# Patient Record
Sex: Male | Born: 1950 | Race: White | Hispanic: No | Marital: Married | Smoking: Never smoker
Health system: Southern US, Community
[De-identification: ages and names within clinical notes are randomized; demographics above are authoritative.]

## PROBLEM LIST (undated history)

## (undated) DIAGNOSIS — T7840XA Allergy, unspecified, initial encounter: Secondary | ICD-10-CM

## (undated) DIAGNOSIS — R7303 Prediabetes: Secondary | ICD-10-CM

## (undated) DIAGNOSIS — E119 Type 2 diabetes mellitus without complications: Secondary | ICD-10-CM

## (undated) DIAGNOSIS — N059 Unspecified nephritic syndrome with unspecified morphologic changes: Secondary | ICD-10-CM

## (undated) DIAGNOSIS — I2510 Atherosclerotic heart disease of native coronary artery without angina pectoris: Secondary | ICD-10-CM

## (undated) DIAGNOSIS — IMO0002 Reserved for concepts with insufficient information to code with codable children: Secondary | ICD-10-CM

## (undated) DIAGNOSIS — I10 Essential (primary) hypertension: Secondary | ICD-10-CM

## (undated) DIAGNOSIS — G4730 Sleep apnea, unspecified: Secondary | ICD-10-CM

## (undated) DIAGNOSIS — E785 Hyperlipidemia, unspecified: Secondary | ICD-10-CM

## (undated) HISTORY — PX: SPINE SURGERY: SHX786

## (undated) HISTORY — DX: Unspecified nephritic syndrome with unspecified morphologic changes: N05.9

## (undated) HISTORY — DX: Hyperlipidemia, unspecified: E78.5

## (undated) HISTORY — PX: ULNAR NERVE REPAIR: SHX2594

## (undated) HISTORY — DX: Prediabetes: R73.03

## (undated) HISTORY — DX: Allergy, unspecified, initial encounter: T78.40XA

## (undated) HISTORY — DX: Atherosclerotic heart disease of native coronary artery without angina pectoris: I25.10

## (undated) HISTORY — DX: Reserved for concepts with insufficient information to code with codable children: IMO0002

## (undated) HISTORY — DX: Essential (primary) hypertension: I10

## (undated) HISTORY — PX: US ECHOCARDIOGRAPHY: HXRAD669

## (undated) HISTORY — DX: Sleep apnea, unspecified: G47.30

## (undated) HISTORY — DX: Type 2 diabetes mellitus without complications: E11.9

## (undated) HISTORY — PX: CERVICAL DISC SURGERY: SHX588

---

## 2003-07-19 DIAGNOSIS — I219 Acute myocardial infarction, unspecified: Secondary | ICD-10-CM

## 2003-07-19 DIAGNOSIS — I2510 Atherosclerotic heart disease of native coronary artery without angina pectoris: Secondary | ICD-10-CM

## 2003-07-19 HISTORY — DX: Atherosclerotic heart disease of native coronary artery without angina pectoris: I25.10

## 2003-07-19 HISTORY — DX: Acute myocardial infarction, unspecified: I21.9

## 2003-07-19 HISTORY — PX: CORONARY STENT PLACEMENT: SHX1402

## 2003-07-21 ENCOUNTER — Inpatient Hospital Stay (HOSPITAL_COMMUNITY): Admission: AD | Admit: 2003-07-21 | Discharge: 2003-07-25 | Payer: Self-pay | Admitting: *Deleted

## 2003-07-22 ENCOUNTER — Encounter: Payer: Self-pay | Admitting: Cardiovascular Disease

## 2004-09-29 ENCOUNTER — Ambulatory Visit: Payer: Self-pay | Admitting: Internal Medicine

## 2004-12-03 ENCOUNTER — Ambulatory Visit: Payer: Self-pay | Admitting: Internal Medicine

## 2005-02-16 ENCOUNTER — Ambulatory Visit: Payer: Self-pay | Admitting: Family Medicine

## 2005-04-21 ENCOUNTER — Ambulatory Visit: Payer: Self-pay | Admitting: Internal Medicine

## 2005-05-10 ENCOUNTER — Ambulatory Visit: Payer: Self-pay | Admitting: Internal Medicine

## 2005-06-23 ENCOUNTER — Ambulatory Visit: Payer: Self-pay | Admitting: Internal Medicine

## 2005-07-22 ENCOUNTER — Ambulatory Visit (HOSPITAL_COMMUNITY): Admission: RE | Admit: 2005-07-22 | Discharge: 2005-07-22 | Payer: Self-pay | Admitting: Neurosurgery

## 2005-08-09 ENCOUNTER — Ambulatory Visit: Payer: Self-pay | Admitting: Cardiology

## 2005-11-08 ENCOUNTER — Ambulatory Visit: Payer: Self-pay | Admitting: Internal Medicine

## 2007-03-27 DIAGNOSIS — J309 Allergic rhinitis, unspecified: Secondary | ICD-10-CM | POA: Insufficient documentation

## 2007-03-27 DIAGNOSIS — I2510 Atherosclerotic heart disease of native coronary artery without angina pectoris: Secondary | ICD-10-CM | POA: Insufficient documentation

## 2007-03-28 DIAGNOSIS — E785 Hyperlipidemia, unspecified: Secondary | ICD-10-CM | POA: Insufficient documentation

## 2007-04-05 ENCOUNTER — Ambulatory Visit: Payer: Self-pay | Admitting: Internal Medicine

## 2007-04-05 LAB — CONVERTED CEMR LAB
ALT: 40 units/L (ref 0–53)
Albumin: 4 g/dL (ref 3.5–5.2)
BUN: 19 mg/dL (ref 6–23)
Basophils Relative: 0.4 % (ref 0.0–1.0)
CO2: 30 meq/L (ref 19–32)
Chloride: 110 meq/L (ref 96–112)
Creatinine, Ser: 1.1 mg/dL (ref 0.4–1.5)
Eosinophils Relative: 3.4 % (ref 0.0–5.0)
GFR calc non Af Amer: 74 mL/min
HDL: 21.5 mg/dL — ABNORMAL LOW (ref >39.0)
Hemoglobin: 16.5 g/dL (ref 13.0–17.0)
LDL Cholesterol: 84 mg/dL (ref 0–99)
Lymphocytes Relative: 27.1 % (ref 12.0–46.0)
Monocytes Absolute: 0.4 10*3/uL (ref 0.2–0.7)
Monocytes Relative: 7.2 % (ref 3.0–11.0)
RDW: 12.5 % (ref 11.5–14.6)
Sodium: 145 meq/L (ref 135–145)
TSH: 0.9 microintl units/mL (ref 0.35–5.50)
VLDL: 17 mg/dL (ref 0–40)
WBC: 5.2 10*3/uL (ref 4.5–10.5)

## 2007-04-25 IMAGING — CR DG CHEST 2V
2 series · 2 of 2 positions shown · non-contrast
Comparison: none

CLINICAL DATA: Preop for ulnar nerve decompression.  
 CHEST - 2 VIEW: 
 Two views of the chest show the lungs to be clear.  The heart is within upper limits of normal.  No bony abnormality.

[view not recorded (1 of 2)]
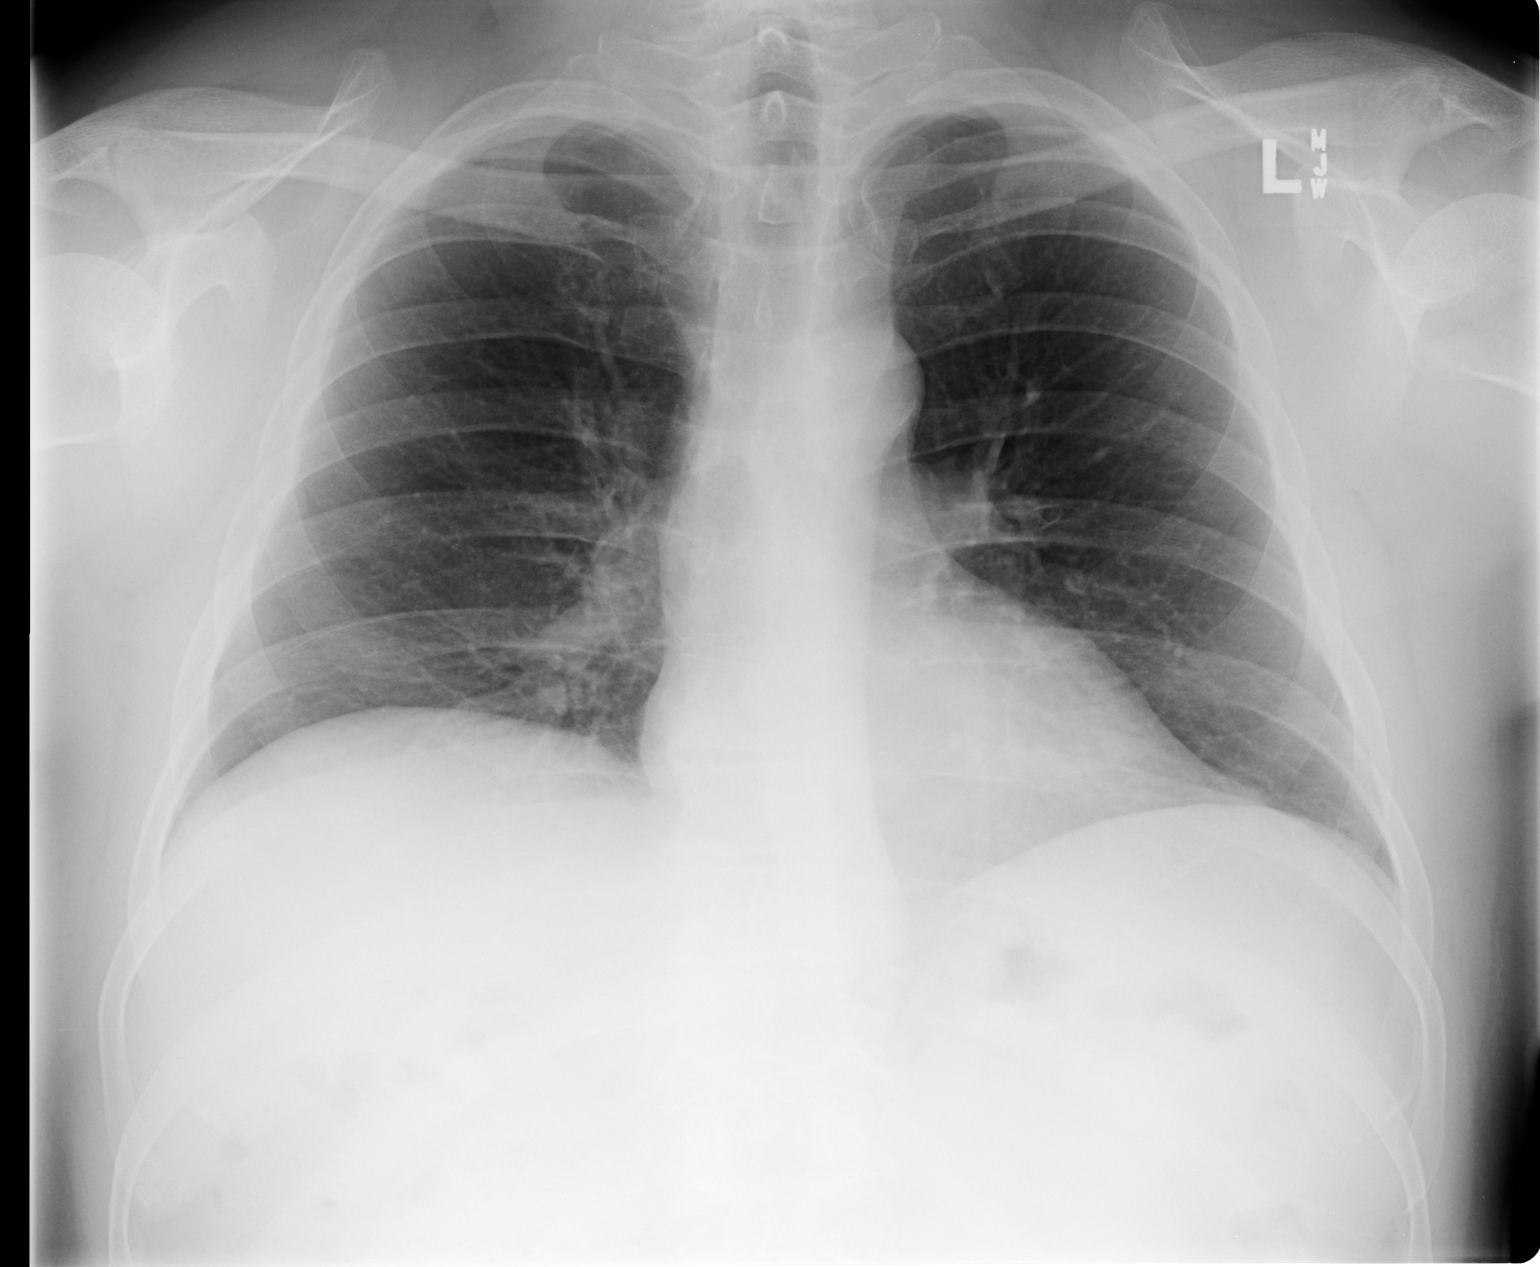

[view not recorded (2 of 2)]
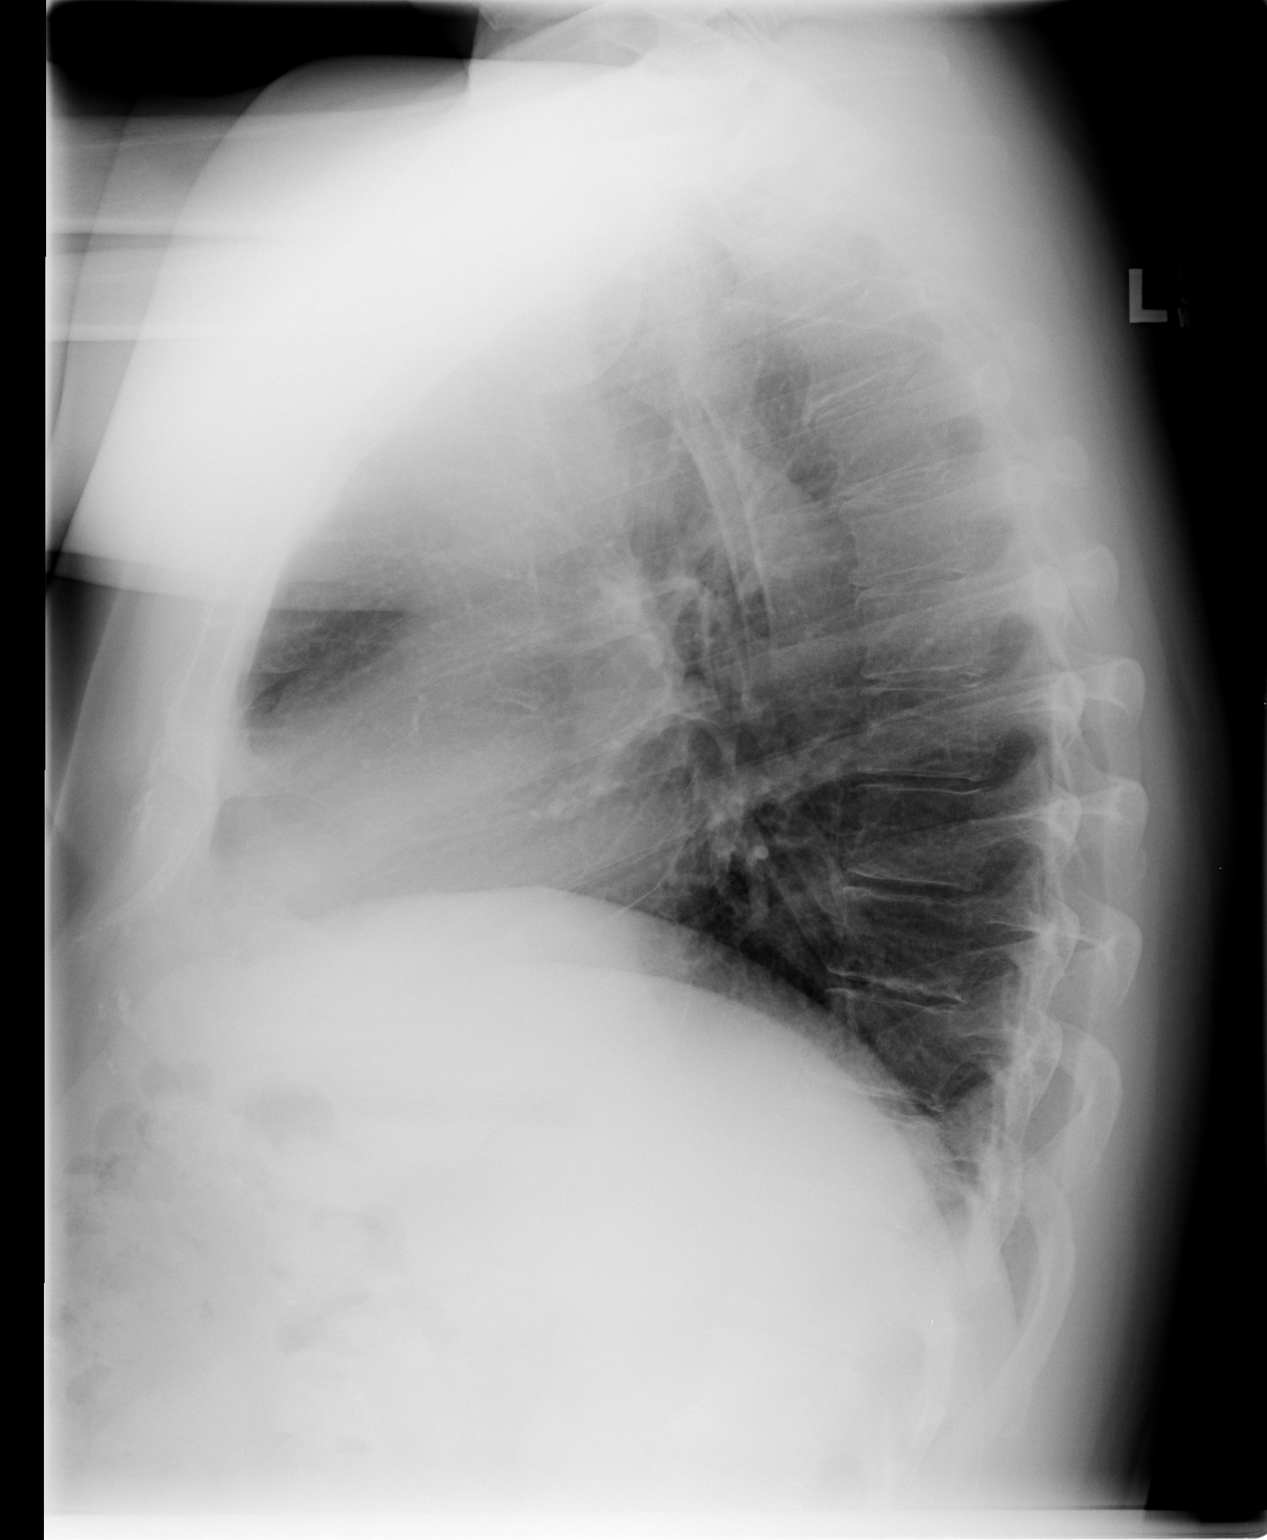

[2 of 2 positions shown; findings below may reference images not displayed]

IMPRESSION: No active lung disease.

## 2007-09-07 ENCOUNTER — Telehealth: Payer: Self-pay | Admitting: Internal Medicine

## 2007-11-13 ENCOUNTER — Telehealth (INDEPENDENT_AMBULATORY_CARE_PROVIDER_SITE_OTHER): Payer: Self-pay | Admitting: *Deleted

## 2008-12-09 ENCOUNTER — Telehealth: Payer: Self-pay | Admitting: Internal Medicine

## 2008-12-23 ENCOUNTER — Ambulatory Visit: Payer: Self-pay | Admitting: Internal Medicine

## 2008-12-23 DIAGNOSIS — IMO0002 Reserved for concepts with insufficient information to code with codable children: Secondary | ICD-10-CM | POA: Insufficient documentation

## 2008-12-24 LAB — CONVERTED CEMR LAB
ALT: 32 units/L (ref 0–53)
Alkaline Phosphatase: 46 units/L (ref 39–117)
Basophils Absolute: 0 10*3/uL (ref 0.0–0.1)
Bilirubin, Direct: 0.1 mg/dL (ref 0.0–0.3)
Chloride: 113 meq/L — ABNORMAL HIGH (ref 96–112)
Cholesterol: 135 mg/dL (ref 0–200)
Creatinine, Ser: 1.1 mg/dL (ref 0.4–1.5)
Eosinophils Absolute: 0.2 10*3/uL (ref 0.0–0.7)
Lymphocytes Relative: 31 % (ref 12.0–46.0)
MCHC: 35.3 g/dL (ref 30.0–36.0)
Neutro Abs: 3.1 10*3/uL (ref 1.4–7.7)
Neutrophils Relative %: 58.5 % (ref 43.0–77.0)
PSA: 1.24 ng/mL (ref 0.10–4.00)
Platelets: 138 10*3/uL — ABNORMAL LOW (ref 150.0–400.0)
Potassium: 4.6 meq/L (ref 3.5–5.1)
RDW: 12.3 % (ref 11.5–14.6)
Sodium: 144 meq/L (ref 135–145)
TSH: 1.1 microintl units/mL (ref 0.35–5.50)
Total Protein: 6.6 g/dL (ref 6.0–8.3)
Triglycerides: 101 mg/dL (ref 0.0–149.0)

## 2008-12-29 ENCOUNTER — Telehealth: Payer: Self-pay | Admitting: Internal Medicine

## 2009-01-05 ENCOUNTER — Telehealth: Payer: Self-pay | Admitting: Internal Medicine

## 2009-01-15 ENCOUNTER — Telehealth: Payer: Self-pay | Admitting: Family Medicine

## 2009-08-20 ENCOUNTER — Ambulatory Visit: Payer: Self-pay | Admitting: Internal Medicine

## 2009-08-24 LAB — CONVERTED CEMR LAB
Alkaline Phosphatase: 48 units/L (ref 39–117)
BUN: 13 mg/dL (ref 6–23)
Basophils Absolute: 0 10*3/uL (ref 0.0–0.1)
Bilirubin, Direct: 0.2 mg/dL (ref 0.0–0.3)
CO2: 31 meq/L (ref 19–32)
Chloride: 106 meq/L (ref 96–112)
Cholesterol: 140 mg/dL (ref 0–200)
Creatinine, Ser: 1.1 mg/dL (ref 0.4–1.5)
Eosinophils Absolute: 0.2 10*3/uL (ref 0.0–0.7)
GFR calc non Af Amer: 73.47 mL/min (ref >60)
HCT: 47.9 % (ref 39.0–52.0)
LDL Cholesterol: 80 mg/dL (ref 0–99)
Lymphs Abs: 1.6 10*3/uL (ref 0.7–4.0)
MCHC: 34.5 g/dL (ref 30.0–36.0)
MCV: 99.4 fL (ref 78.0–100.0)
Monocytes Absolute: 0.3 10*3/uL (ref 0.1–1.0)
Platelets: 137 10*3/uL — ABNORMAL LOW (ref 150.0–400.0)
RDW: 12.3 % (ref 11.5–14.6)
TSH: 1.2 microintl units/mL (ref 0.35–5.50)
Total Protein: 7 g/dL (ref 6.0–8.3)

## 2009-09-08 ENCOUNTER — Ambulatory Visit: Payer: Self-pay | Admitting: Internal Medicine

## 2009-09-09 ENCOUNTER — Telehealth: Payer: Self-pay | Admitting: Internal Medicine

## 2009-09-09 ENCOUNTER — Encounter: Payer: Self-pay | Admitting: Internal Medicine

## 2010-08-17 NOTE — Progress Notes (Signed)
Summary: needs written scripts for mail order  Phone Note Refill Request Call back at [phone] Message from:  Patient  Refills Requested: Medication #1:  SIMVASTATIN 80 MG TABS Take one by mouth once a day  Medication #2:  METOPROLOL TARTRATE 25 MG  TABS 1/2 tablet two times a day.  Medication #3:  BENAZEPRIL HCL 10 MG TABS Take one by mouth once a day Pt is asking for 90 day scripts to send to [facility]- he is brand new with them and has not yet sent in his paper work, he will send the scripts with the paperwork.  Please call when ready.  Initial call taken by: Lowella Petties CMA,  September 09, 2009 11:18 AM  Follow-up for Phone Call        rx's in your inbox DeShannon Katrinka Blazing CMA Duncan Dull)  September 09, 2009 12:27 PM   signed Follow-up by: Cindee Salt MD,  September 09, 2009 12:54 PM  Additional Follow-up for Phone Call Additional follow up Details #1::        Spoke with patient and advised rx ready for pick-up  Additional Follow-up by: Mervin Hack CMA Duncan Dull),  September 09, 2009 2:18 PM    Prescriptions: METOPROLOL TARTRATE 25 MG  TABS (METOPROLOL TARTRATE) 1/2 tablet two times a day  #90 x 3   Entered by:   Mervin Hack CMA (AAMA)   Authorized by:   Cindee Salt MD   Signed by:   Mervin Hack CMA (AAMA) on 09/09/2009   Method used:   Print then Give to Patient   RxID:   [id number] BENAZEPRIL HCL 10 MG TABS (BENAZEPRIL HCL) Take one by mouth once a day  #90 x 3   Entered by:   Mervin Hack CMA (AAMA)   Authorized by:   Cindee Salt MD   Signed by:   Mervin Hack CMA (AAMA) on 09/09/2009   Method used:   Print then Give to Patient   RxID:   [id number] SIMVASTATIN 80 MG TABS (SIMVASTATIN) Take one by mouth once a day  #90 x 3   Entered by:   Mervin Hack CMA (AAMA)   Authorized by:   Cindee Salt MD   Signed by:   Mervin Hack CMA (AAMA) on 09/09/2009   Method used:   Print then Give to Patient   RxID:    [id number]

## 2010-08-17 NOTE — Letter (Signed)
Summary: [facility]: Immunoassay Fecal Occult Blood (iFOB) Order Form  [place] [facility]  [address] [place], [place] [address]   Phone: [phone]  Fax: [phone]      [place] [facility]: Immunoassay Fecal Occult Blood (iFOB) Order Form   August 20, 2009 MRN: [id number]   Anthony Hunt February 11, 1951   Physicican Name:_________[place]________________  Diagnosis Code:_______V76.51___________________      Cindee Salt MD

## 2010-08-17 NOTE — Letter (Signed)
Summary: Results Follow up Letter  [place] at [facility]  [address] [place], [place] [address]   Phone: [phone]  Fax: [phone]    09/09/2009 MRN: [id number]  LENOX BINK [address] [place], [place]  [address]  Dear Mr. WEIL,  The following are the results of your recent test(s):  Test         Result    Pap Smear:        Normal _____  Not Normal _____ Comments: ______________________________________________________ Cholesterol: LDL(Bad cholesterol):         Your goal is less than:         HDL (Good cholesterol):       Your goal is more than: Comments:  ______________________________________________________ Mammogram:        Normal _____  Not Normal _____ Comments:  ___________________________________________________________________ Hemoccult:        Normal __X___  Not normal _______ Comments:  stool test is negative for blood, we will recheck this next year  _____________________________________________________________________ Other Tests:    We routinely do not discuss normal results over the telephone.  If you desire a copy of the results, or you have any questions about this information we can discuss them at your next office visit.   Sincerely,      Tillman Abide, MD

## 2010-08-17 NOTE — Assessment & Plan Note (Signed)
Summary: CPX... CYD   Vital Signs:  Patient profile:   60 year old male Weight:      236 pounds Temp:     98.3 degrees F oral Pulse rate:   68 / minute Pulse rhythm:   regular BP sitting:   120 / 80  (left arm) Cuff size:   large  Vitals Entered By: Mervin Hack CMA Duncan Dull) (August 20, 2009 3:25 PM) CC: adult physical   History of Present Illness: Doing well neuralgia has resolved  Tries to work out when he can Still on the road a lot    Allergies: No Known Drug Allergies  Past History:  Past medical, surgical, family and social histories (including risk factors) reviewed for relevance to current acute and chronic problems.  Past Medical History: Reviewed history from 12/23/2008 and no changes required. Allergic rhinitis Coronary artery disease------------------------------- Dr Antoine Poche Hyperlipidemia  Past Surgical History: Reviewed history from 03/27/2007 and no changes required. 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 Father: Died  ~84, lung cancer Mother: Died  ~48, CAD, PVD Siblings: One sister- bilat THR CAD in Mom No HTN or DM No prostate or colon cancer Pat aunt & Mat uncle died of cancer  Social History: Reviewed history from 04/05/2007 and no changes required. 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 Eyes:  Denies double vision and vision loss-1 eye. ENT:  Denies decreased hearing and ringing in ears; teeth okay---regular with dentist. CV:  Denies chest pain or discomfort, difficulty breathing at night, difficulty breathing while lying down, fainting, lightheadness, palpitations, and shortness of breath with  exertion. Resp:  Denies cough and shortness of breath. GI:  Denies abdominal pain, bloody stools, change in bowel habits, dark tarry stools, indigestion, nausea, and vomiting. GU:  Denies erectile dysfunction, urinary frequency, and urinary hesitancy. MS:  Denies joint pain and joint swelling. Derm:  Denies lesion(s) and rash. Neuro:  Denies headaches, numbness, tingling, and weakness. Psych:  Denies anxiety and depression. Heme:  Denies abnormal bruising and enlarge lymph nodes. Allergy:  Complains of seasonal allergies and sneezing; satisfied with meds.  Physical Exam  General:  alert and normal appearance.   Eyes:  pupils equal, pupils round, pupils reactive to light, and no optic disk abnormalities.   Ears:  R ear normal and L ear normal.   Mouth:  no erythema and no lesions.   Neck:  supple, no masses, no thyromegaly, no carotid bruits, and no cervical lymphadenopathy.   Lungs:  normal respiratory effort and normal breath sounds.   Heart:  normal rate, regular rhythm, no murmur, and no gallop.   Abdomen:  soft and non-tender.   Rectal:  no hemorrhoids and no masses.   Prostate:  no gland enlargement and no nodules.   Msk:  no joint tenderness and no joint swelling.   Pulses:  1+ in feet Extremities:  no edema Neurologic:  alert & oriented X3, strength normal in all extremities, and gait normal.   Skin:  no rashes and no suspicious lesions.   Axillary Nodes:  No palpable lymphadenopathy Psych:  normally interactive, good eye contact, not anxious appearing, and not depressed appearing.     Impression & Recommendations:  Problem # 1:  PREVENTIVE HEALTH CARE (ICD-V70.0) Assessment Comment Only Healthy will check PSA Stool tests pneumovax counselled about fitness  Problem # 2:  CORONARY ARTERY DISEASE (ICD-414.00) Assessment: Unchanged  quiet due for cardiology eval  His updated medication list for this problem includes:    Benazepril Hcl 10 Mg Tabs (Benazepril hcl)  .Marland Kitchen... Take one by mouth once a day    Aspirin 81 Mg Tbec (Aspirin) .Marland Kitchen... Take one by mouth once a day    Metoprolol Tartrate 25 Mg Tabs (Metoprolol tartrate) .Marland Kitchen... 1/2 tablet two times a day  Orders: Venipuncture ([address]) TLB-Renal Function Panel (80069-RENAL) TLB-CBC Platelet - w/Differential (85025-CBCD) TLB-TSH (Thyroid Stimulating Hormone) (84443-TSH)  Problem # 3:  ALLERGIC RHINITIS (ICD-477.9) Assessment: Unchanged uses meds as needed   The following medications were removed from the medication list:    Fluticasone Propionate 50 Mcg/act Susp (Fluticasone propionate) .Marland Kitchen... 2 sprays each nostil daily for allergies  Complete Medication List: 1)  Simvastatin 80 Mg Tabs (Simvastatin) .... Take one by mouth once a day 2)  Benazepril Hcl 10 Mg Tabs (Benazepril hcl) .... Take one by mouth once a day 3)  Aspirin 81 Mg Tbec (Aspirin) .... Take one by mouth once a day 4)  Metoprolol Tartrate 25 Mg Tabs (Metoprolol tartrate) .... 1/2 tablet two times a day  Other Orders: TLB-PSA (Prostate Specific Antigen) (84153-PSA) TLB-Lipid Panel (80061-LIPID) TLB-Hepatic/Liver Function Pnl (80076-HEPATIC)  Patient Instructions: 1)  Please schedule a follow-up appointment in 1 year.  2)  Complete your hemoccult cards and return them soon.  Prescriptions: SIMVASTATIN 80 MG TABS (SIMVASTATIN) Take one by mouth once a day  #30 Tablet x 12   Entered by:   Mervin Hack CMA (AAMA)   Authorized by:   Cindee Salt MD   Signed by:   Mervin Hack CMA (AAMA) on 08/20/2009   Method used:   Elect[facility] to        [facility] (retail)       [address]       [place], [place]  [address]       Ph: [phone] or [phone]       Fax: [phone]   RxID:   [id number]   Current Allergies (reviewed today): No known allergies   Appended Document: CPX... CYD    Clinical Lists Changes  Orders: Added new Service order of Pneumococcal Vaccine (66440) -  Signed Added new Service order of Admin 1st Vaccine (34742) - Signed Added new Service order of Admin 1st Vaccine [facility]) [phone]) - Signed Observations: Added new observation of PNEUMOVAXVIS: 02/13/96 version given August 20, 2009. (08/20/2009 16:09) Added new observation of PNEUMOVAXLOT: 1295z (08/20/2009 16:09) Added new observation of PNEUMOVAXEXP: 11/05/2010 (08/20/2009 16:09) Added new observation of PNEUMOVAXBY: DeShannon Smith CMA (AAMA) (08/20/2009 16:09) Added new observation of PNEUMOVAXRTE: IM (08/20/2009 16:09) Added new observation of PNEUMOVAXMFR: Merck (08/20/2009 16:09) Added new observation of PNEUMOVAXSIT: left deltoid (08/20/2009 16:09) Added new observation of PNEUMOVAX: Pneumovax (08/20/2009 16:09)       Pneumovax Vaccine    Vaccine Type: Pneumovax    Site: left deltoid    Mfr: Merck    Dose: 0.5 ml    Route: IM    Given by: Mervin Hack CMA (AAMA)    Exp. Date: 11/05/2010    Lot #: [id number]    VIS given: 02/13/96 version given August 20, 2009.

## 2010-11-15 ENCOUNTER — Other Ambulatory Visit: Payer: Self-pay | Admitting: *Deleted

## 2010-11-16 ENCOUNTER — Other Ambulatory Visit: Payer: Self-pay | Admitting: *Deleted

## 2010-11-16 NOTE — Telephone Encounter (Signed)
Form on your desk  

## 2010-11-17 MED ORDER — METOPROLOL TARTRATE 25 MG PO TABS: 12.5000 mg | ORAL_TABLET | Freq: Two times a day (BID) | ORAL | Status: DC

## 2010-11-17 MED ORDER — SIMVASTATIN 80 MG PO TABS: 80.0000 mg | ORAL_TABLET | Freq: Every day | ORAL | Status: DC

## 2010-11-17 MED ORDER — BENAZEPRIL HCL 10 MG PO TABS: 10.0000 mg | ORAL_TABLET | Freq: Every day | ORAL | Status: DC

## 2010-11-17 NOTE — Telephone Encounter (Signed)
Okay 3 months of each without refills Overdue for PE and none on schedule

## 2010-11-17 NOTE — Telephone Encounter (Signed)
rx faxed to pharmacy manually  

## 2010-12-03 NOTE — H&P (Signed)
NAME:  Anthony Hunt, Anthony Hunt NO.:  [id number]   MEDICAL RECORD NO.:  [id number]                   PATIENT TYPE:  INP   LOCATION:  1823                                 FACILITY:  [facility]   PHYSICIAN:  Rollene Rotunda, M.D.                DATE OF BIRTH:  [birth year]   DATE OF ADMISSION:  07/21/2003  DATE OF DISCHARGE:                                HISTORY & PHYSICAL   PRIMARY CARE PHYSICIAN:  Dr. Lacie Scotts, [facility]   REASON FOR PRESENTATION:  Evaluate patient with chest pain.   HISTORY OF PRESENT ILLNESS:  The patient is a pleasant 60 year old white  gentleman with no prior cardiac history.  He was in his usual state of good  health until approximately 5:30 p.m.  He was working as a Production designer, theatre/television/film at [employer]  and was sweeping up.  He developed some substernal chest discomfort.  It was  moderate in intensity.  It would stop when he stopped what he was doing.  It  came on at the end of his shift.  On the drive home it went away.  He was  able to go home and go to sleep later in the evening.  However,  approximately 1:30 he awoke with intense substernal chest discomfort with  radiation to his arms.  He described it as a hurting.  There was  associated diaphoresis.  He had not had pain like this before.  It was not  made worse with inspiration or movement.  He called EMS.  He was treated  with sublingual nitroglycerin and aspirin without improvement.  He presented  to the emergency room where he was found to have sinus bradycardia with 0.5  mm ST elevation in lead 1 and aVL and mild ST depression in V4-V6.  The pain  has been unrelenting with morphine, IV nitroglycerin, heparin.   In retrospect, the patient has never had pain like this before.  He has  otherwise been healthy without recent symptoms.  He denies any shortness of  breath, PND, or orthopnea.  He has had no palpitations, presyncope, or  syncope.   PAST MEDICAL HISTORY:  He has no history of hypertension,  diabetes, or  hyperlipidemia.  He does not see a physician frequently, so really does not  know his cholesterol status.   PAST SURGICAL HISTORY:  Cervical disk surgery approximately 10 years ago.   ALLERGIES:  None (the patient is not allergic to shellfish).   MEDICATIONS:  None.   SOCIAL HISTORY:  The patient is married.  He has one son.  He is a Production designer, theatre/television/film  at [employer].  He has never smoked cigarettes.  Does not drink alcohol.   FAMILY HISTORY:  Noncontributory for early coronary disease, though his  mother had a myocardial infarction at some point in her early 55s.   REVIEW OF SYSTEMS:  As stated in the HPI and otherwise negative for all  other  systems.   PHYSICAL EXAMINATION:  GENERAL:  The patient appears to be uncomfortable and  pale.  VITAL SIGNS:  Blood pressure 100/60, heart rate 50 and regular.  HEENT:  Eyelids unremarkable.  Pupils equal, round, react to light.  Fundi  not visualized.  Oral mucosa unremarkable.  NECK:  No jugular venous distention.  Wave form within normal limits.  Carotid upstroke brisk and symmetric.  No bruits, thyromegaly.  LYMPHATICS:  No cervical, axillary, or inguinal adenopathy.  LUNGS:  Clear to auscultation bilaterally.  BACK:  No costovertebral angle tenderness.  CHEST:  Unremarkable.  HEART:  PMI not displaced or sustained.  S1 and S2 within normal limits.  No  S3.  No S4.  No murmurs.  ABDOMEN:  Flat, positive bowel sounds.  Normal in frequency and pitch.  No  bruits, rebound, guarding, midline pulsatile mass, organomegaly.  SKIN:  No rash, nodule.  EXTREMITIES:  2+ pulse throughout.  No edema.  No clubbing, cyanosis.  NEUROLOGIC:  Oriented to person, place, and time.  Cranial nerves II-XII  grossly intact.  Motor grossly intact.   LABORATORIES:  Chest x-ray:  Poor inspiratory effort, rotated, no acute air  space disease.   Sodium 141, potassium 3.4, BUN 20, creatinine 0.8.  Hemoglobin 13.3.  EKG:  Sinus bradycardia, 0.5 mm ST elevation 1  and aVL, concave upward ST  depression 1, 3, aVF, V5, and V6.   IMPRESSION AND PLAN:  1. Chest pain:  The patient's chest pain is very worrisome for unstable     angina with a ruptured plaque.  It is unrelenting despite best medical     therapy.  Given this he will be referred for cardiac catheterization.     The risks and benefits have been explained in detail with the patient,     his wife, and his son.  He understands and agrees to proceed.  He will     continue with heparin and nitrates.  Further evaluation based on results     of catheterization.  2. Risk reduction.  Will check a lipid profile.  3. Hypokalemia.  He will be supplement with potassium.                                                Rollene Rotunda, M.D.    JH/MEDQ  D:  07/21/2003  T:  07/21/2003  Job:  [id number]

## 2010-12-03 NOTE — Op Note (Signed)
NAME:  Anthony Hunt, Anthony Hunt              ACCOUNT NO.:  [id number]   MEDICAL RECORD NO.:  [id number]          PATIENT TYPE:  AMB   LOCATION:  SDS                          FACILITY:  [facility]   PHYSICIAN:  Danae Orleans. Venetia Maxon, M.D.  DATE OF BIRTH:  Nov 27, 1950   DATE OF PROCEDURE:  07/22/2005  DATE OF DISCHARGE:                                 OPERATIVE REPORT   PREOPERATIVE DIAGNOSIS:  Left ulnar neuropathy.   POSTOPERATIVE DIAGNOSIS:  Left ulnar neuropathy.   PROCEDURE:  Left ulnar nerve decompression.   SURGEON:  Danae Orleans. Venetia Maxon, M.D.   ANESTHESIA:  General endotracheal anesthesia.   ESTIMATED BLOOD LOSS:  Minimal.   COMPLICATIONS:  None.   DISPOSITION:  To Recovery.   INDICATIONS:  Anthony Hunt is a 60 year old man with a profound left ulnar  neuropathy with significant hand intrinsic weakness and early atrophy in his  hand in the ulnar distribution.  He had an EMS which demonstrated a severe  ulnar nerve proximal to the elbow.  It was elected therefore to take him to  surgery for ulnar nerve decompression.   DESCRIPTION OF PROCEDURE:  Anthony Hunt were brought to the operating room.  Following satisfactory and uncomplicated induction of general anesthesia  with laryngeal mask anesthesia, the patient was placed in the supine  position and his left arm was placed on the extremity board.  His left upper  extremity was then prepped and draped with Betadine scrub and paint.  He was  placed in an extremity stockinette and extremity draped.  His left medial  epicondyle was identified and a lazy Omega incision based on the medial  epicondyle was then marked and infiltrated with 0.25% Marcaine, 0.5%  lidocaine and 1:200,000 epinephrine.  This incision was carried through the  skin with a 15 blade and subcutaneous tissues were dissected and incised  with Metzenbaum scissors.  A Weitlaner retractor was placed.  The ulnar  nerve was then identified as it coursed around the medial epicondyle and  it  was cleared of investing tissue.  On decompressing the nerve proximally to  the elbow, a rather large band of fairly dense connective tissue was  identified which was directly compressing the ulnar; this was distal to the  intermuscular septum, but proximal to the medial epicondyle and appeared to  be consistent with the location of the block as identified on EMG; this was  then cut with scissors with resultant significant decompression of the ulnar  nerve.  The nerve in fact was grooved by this connective tissue band.  Subsequently, the nerve was then decompressed more proximally as it extended  up the arm and decompression was carried past the intermuscular septum.  Subsequently, the decompression was then performed more distally and the  nerve was decompressed as it dove between the 2 heads of the flexor carpi  ulnaris.  At this point it was felt that the nerve was well-decompressed and  stimulation demonstrated good brisk movement of the ulnar innervated  muscular of the hand.  The wound was then copiously irrigated with  bacitracin and saline.  The subcutaneous tissues were reapproximated  with 2-  0 Vicryl interrupted and inverted sutures.  The skin edges were  reapproximated with interrupted 3-0 Vicryl subcuticular stitch.  The wound  was dressed with Benzoin and Steri-Strips, bacitracin ointment, Telfa and  then a fluff gauze, Kerlix and Kling wrap.  The patient was extubated in the  operating room and taken to the recovery room in stable and satisfactory  condition, having tolerated his operation well.  Counts were correct at the  end of the case.      Danae Orleans. Venetia Maxon, M.D.  Electronically Signed     JDS/MEDQ  D:  07/22/2005  T:  07/23/2005  Job:  [id number]

## 2010-12-03 NOTE — Cardiovascular Report (Signed)
NAME:  AVA, DEGUIRE                        ACCOUNT NO.:  [id number]   MEDICAL RECORD NO.:  [id number]                   PATIENT TYPE:  INP   LOCATION:  2302                                 FACILITY:  [facility]   PHYSICIAN:  Charlies Constable, M.D.                  DATE OF BIRTH:  May 14, 1951   DATE OF PROCEDURE:  07/21/2003  DATE OF DISCHARGE:                              CARDIAC CATHETERIZATION   CLINICAL HISTORY:  Mr. Pettis is 60 years old and has had exertional chest  pain and awoke this morning at 1:30 a.m. with severe chest pain.  He was  seen in the emergency department by Dr. Antoine Poche.  His EKG initially did not  show any major changes but because of persistent chest pain, he was  transferred to the catheterization lab for catheterization and possible  intervention.   DESCRIPTION OF PROCEDURE:  The procedure was performed via the right femoral  artery using an arterial sheath and 6 French preformed coronary catheters.  A front wall arterial puncture was performed and Omnipaque contrast was  used.  After completion of the diagnostic study, we made a decision to  proceed with intervention on the totally-occluded left anterior descending  artery.  The patient was given additional heparin to prolong the ACT to  greater than 200 seconds.  He also was given double-bolus Integrilin  infusion and 300 mg of Plavix.  He had been given aspirin and heparin in the  emergency department.   We used a Q4 6 [place] guiding catheter with side holes and a PT2 light-  support wire.  We crossed the totally-occluded LAD with the wire without too  much difficulty.  We dilated with a 2.5 x 20 mm Maverick balloon, performing  two inflations up to 8 atmospheres for 30 seconds.  We then deployed a 2.75  x 20 mm Taxus stent, deploying this with one inflation of 16 atmospheres for  30 seconds.  Following deployment of the stent the flow, which had been TIMI-  3 after balloon inflation, decreased to TIMI-2.   We gave intracoronary  verapamil, and the flow returned to TIMI-3.   The patient's oxygen saturations went down to the low 90s and his LVEDP was  31, so we elected to place a Swan-Ganz catheter.  This was performed via the  right femoral vein using a venous sheath.   The patient remained stable throughout the procedure and left the laboratory  in satisfactory condition.   RESULTS:  1. Left main coronary artery:  The left main coronary artery was free of     significant disease.  2. Left anterior descending artery:  The left anterior descending artery     gave rise to a very large septal perforator and then was completely     occluded.  There was also segmental disease extending from the ostium     that was estimated at 40%.  3.  Circumflex artery:  The circumflex artery gave rise to a large marginal     branch, a small marginal branch, and a posterolateral branch.  There was     30% narrowing in the large marginal branch.  4. Right coronary artery:  The right coronary artery was a large, dominant     vessel that gave rise to a right conus branch, a right ventricular     branch, a posterior descending branch, and three posterolateral branches.     There was 30% narrowing in the proximal vessel, and there were     irregularities in the proximal and midvessel.  5. Left ventriculogram:  The left ventriculogram performed in the RAO     projection showed akinesis of the anterolateral wall and apex.  The     anterior wall near the base and inferior wall moved well.  The estimated     ejection fraction was 35%.   Following stenting of the lesion in the proximal LAD, the stenosis improved  from 100% to 0% and the flow improved from TIMI-0 to TIMI-3 flow.   HEMODYNAMIC DATA:  The right atrial pressure was 19 mean.  The pulmonary  artery pressure was 56/30 with a mean of 43.  The pulmonary wedge pressure  was 32 mean.  Left ventricular pressure was 141/31.  The aortic pressure was  141/82.   Cardiac output/cardiac index was 7.3/3.4 L/min. per sq. m by Hiram Comber.  The patient's O2 saturation in the procedure was 92% on 5 L nasal prongs.   The patient had the onset of chest pain at 0130 and arrived at [facility]  Emergency Department at [phone].  The first balloon inflation was at 0513.  This gave a door to balloon time of 2 hours 34 minutes and a reperfusion  time of 3 hours 43 minutes.   CONCLUSION:  1. Acute anterior wall myocardial infarction with total occlusion of the     proximal left anterior descending artery, 30% narrowing of the circumflex     marginal vessel, 30% narrowing in the proximal right coronary artery, and     anterolateral wall and apical wall akinesis with an estimated ejection     fraction of 35%.  2. Successful stent deployment in the proximal left anterior descending     artery with improvement in sentinel narrowing from 100% to 0%.   DISPOSITION:  Patient returned to __________ for further observation.  While  the patient only had minimal ST changes, in retrospect there is 1 mm ST  elevation in I and aVL and 1 mm in V2 and possibly V3, so I think we can  classify this as an ST elevation myocardial infarction for the purposes of  our data base.                                               Charlies Constable, M.D.    BB/MEDQ  D:  07/21/2003  T:  07/21/2003  Job:  [id number]   cc:   Rollene Rotunda, M.D.   Cardiopulmonary Lab

## 2010-12-03 NOTE — Discharge Summary (Signed)
NAME:  Anthony Hunt, Anthony Hunt                        ACCOUNT NO.:  [id number]   MEDICAL RECORD NO.:  [id number]                   PATIENT TYPE:  INP   LOCATION:  2033                                 FACILITY:  [facility]   PHYSICIAN:  Rollene Rotunda, M.D.                DATE OF BIRTH:  1951-01-18   DATE OF ADMISSION:  07/21/2003  DATE OF DISCHARGE:  07/25/2003                                 DISCHARGE SUMMARY   DISCHARGE DIAGNOSES:  1. Admitted with unstable angina/abnormal electrocardiogram.  2. Acute anterior wall myocardial infarction.  3. A 100% occlusion of the proximal left anterior descending artery on     catheterization, July 21, 2003.  4. Moderate left ventricular dysfunction with an ejection fraction of 35% at     catheterization.  Subsequent ejection fraction 40-50% by 2D     echocardiogram on January 4th.   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   PROCEDURES:  1. Left heart catheterization on July 21, 2003.  Emergent left heart     catheterization by Dr. Charlies Constable.  The study showed that the proximal     LAD was totally occluded.  A stent was placed with resumption of TIMI III     flow and reduction of the stenosis from 100% to 0.  He does have some     residual disease, a 30% proximal RCA stenosis and a 30% stenosis in the     first obtuse marginal.  Also, there was a 40% stenosis just prior to the     placement of the stent.  Ejection fraction at the time of catheterization     was 35% with anterior akinesis.  2. A 2D echocardiogram on July 21, 2002.  The study showed that overall     left ventricular systolic function was mildly decreased.  Left     ventricular ejection fraction was estimated at 40-50%.  Hypokinesis of     the para-apical wall.  Hypokinesis of the septal wall.  No significant     aortic regurgitation.  No significant mitral regurgitation.  There is no     echocardiographic evidence for a cardiac source of embolism.   DISCHARGE  DISPOSITION:  Anthony Hunt is ready for discharge on January  7, which is four days after undergoing left heart catheterization for acute  anterior wall myocardial infarction in the setting of total occlusion of the  proximal LAD.  He did have some chest pain that was resolved after placement  of a stent on January 3.  He did have careful followup with 2D  echocardiogram in the setting of mild left ventricular dysfunction to rule  out thrombus.  No thrombus was found.  The patient has been afebrile this  hospitalization.  His mental status is clear.  He has had no respiratory  distress.  He has been placed on several medications to decrease cardiac  risk in  the future and has tolerated them well.  He goes home on the  following medications.   DISCHARGE MEDICATIONS:  1. Plavix 75 mg daily.  2. Enteric-coated aspirin 325 mg daily.  3. Lipitor 80 mg daily at bedtime.  4. Altace 2.5 mg daily in the morning.  5. Toprol XL 25 mg daily.  6. Nitroglycerin 0.4 mg 1 tablet under the tongue every 5 minutes for 3     doses as needed for chest pain. 7.  If he has pain at the catheterization     site, Tylenol should do well, 325 mg 1-2 tabs every 4-6 hours as needed     for pain.   He is to walk daily to keep up his strength.  At the time, he can drive.  He  is asked to not engage in any heavy lifting for the next week.  Dr. Antoine Poche  will advise when the patient can return to work at the office visit.   DISCHARGE DIET:  Low sodium, low cholesterol diet.   He may shower.  He is to call [phone] if he experiences swelling or  increased pain at the catheterization site.  He has a followup with Dr.  Rollene Rotunda on Friday, August 08, 2003 at 10:45 in the morning.   BRIEF HISTORY:  Anthony Hunt is a 60 year old gentleman with no prior  cardiac history.  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  He developed some substernal chest   discomfort, which was moderate.  It would stop when he ceased working.  It  happened at the end of his shift on the drive home.  It went away.  He was  able to go home and go to sleep later in the evening; however, at 1:30 in  the morning on January 3, he awoke with intense substernal chest discomfort  which radiated to the arms.  He describes it as hurting.  There was  associated diaphoresis.  He has never had pain like this before.  It was not  made worse with inspiration or movement.  He called emergency medical  services and was treated with sublingual nitroglycerin and aspirin without  improvement.  Presented to the emergency room where he was found to have  sinus bradycardia with 0.5 mm ST elevation in lead I and aVL and mild ST  depression in leads V4-6.  The pain has been unrelated with morphine, IV  nitroglycerin, and heparin.  In retrospect, the patient has never pain like  this before.  He has been otherwise healthy without recent symptoms.  He has  had no palpitations, presyncope or syncope.  He has had no prior history,  that he knows of, of hypertension, diabetes, or hyperlipidemia.  He does not  see a physician frequently.  Based on symptoms and ST structure of his  electrocardiogram, the chest pain was worrisome for unstable angina with the  ruptured plaque.  It is unrelenting despite best medical therapy.  Patient  will be referred for urgent cardiac catheterization.  He will be started on  heparin and nitrates.  A lipid profile will be obtained.   LABORATORY STUDIES:  On July 23, 2003:  Sodium 137, potassium 3.4,  chloride 104, carbon dioxide 29, glucose 115, BUN 16, creatinine 1.0.  This  is a post catheterization study with normal creatinine.  Potassium was  replenished.  His BNP on January 5th was 97.8.  Lipid  profile on January  5th:  Cholesterol 169, triglycerides 95, HDL cholesterol 32, LDL cholesterol 118.  Complete blood count on the day after catheterization on  January 4th:  White cells 9.7, hemoglobin 14.5, hematocrit 40.9, platelets 127.  Liver  function tests on admission on January 3rd:  Alkaline phosphatase 48, SGOT  17, SGPT 21.  Cardiac enzymes on admission on January 3rd:  Persistently  elevated troponin I was 0.29 and progressing eight hours later to 61.1 and  then eight hours after that to 57.6.   The patient was discharged on January 7th with medications and followup.  His condition is improved.      Maple Mirza, P.A.                    Rollene Rotunda, M.D.    GM/MEDQ  D:  07/25/2003  T:  07/25/2003  Job:  [id number]   cc:   Evelene Croon  [address]  [place]  [place] [address]  Fax: [phone]

## 2011-03-17 ENCOUNTER — Other Ambulatory Visit: Payer: Self-pay | Admitting: *Deleted

## 2011-03-17 MED ORDER — BENAZEPRIL HCL 10 MG PO TABS: 10.0000 mg | ORAL_TABLET | Freq: Every day | ORAL | Status: DC

## 2011-03-17 MED ORDER — SIMVASTATIN 80 MG PO TABS: 80.0000 mg | ORAL_TABLET | Freq: Every day | ORAL | Status: DC

## 2011-03-17 NOTE — Telephone Encounter (Signed)
Pt advised that he must have an appt before further refills.

## 2011-05-18 ENCOUNTER — Encounter: Payer: Self-pay | Admitting: Internal Medicine

## 2011-05-19 ENCOUNTER — Ambulatory Visit (INDEPENDENT_AMBULATORY_CARE_PROVIDER_SITE_OTHER): Payer: BC Managed Care – PPO | Admitting: Internal Medicine

## 2011-05-19 ENCOUNTER — Encounter: Payer: Self-pay | Admitting: Internal Medicine

## 2011-05-19 VITALS — BP 140/77 | HR 56 | Temp 98.6°F | Ht 69.0 in | Wt 232.0 lb

## 2011-05-19 DIAGNOSIS — E785 Hyperlipidemia, unspecified: Secondary | ICD-10-CM

## 2011-05-19 DIAGNOSIS — Z23 Encounter for immunization: Secondary | ICD-10-CM

## 2011-05-19 DIAGNOSIS — I2510 Atherosclerotic heart disease of native coronary artery without angina pectoris: Secondary | ICD-10-CM

## 2011-05-19 DIAGNOSIS — Z0000 Encounter for general adult medical examination without abnormal findings: Secondary | ICD-10-CM | POA: Insufficient documentation

## 2011-05-19 MED ORDER — SIMVASTATIN 80 MG PO TABS: 80.0000 mg | ORAL_TABLET | Freq: Every day | ORAL | Status: DC

## 2011-05-19 MED ORDER — BENAZEPRIL HCL 10 MG PO TABS: 10.0000 mg | ORAL_TABLET | Freq: Every day | ORAL | Status: DC

## 2011-05-19 MED ORDER — METOPROLOL TARTRATE 25 MG PO TABS: 12.5000 mg | ORAL_TABLET | Freq: Two times a day (BID) | ORAL | Status: DC

## 2011-05-19 NOTE — Patient Instructions (Signed)
Please do the stool test and send it back in

## 2011-05-19 NOTE — Assessment & Plan Note (Signed)
Has done fine on simvastatin 80 Will continue

## 2011-05-19 NOTE — Assessment & Plan Note (Signed)
Healthy Discussed fitness Will check stool immunoassay PSA after discussion

## 2011-05-19 NOTE — Assessment & Plan Note (Signed)
CAD has been quiet No symptoms or change in exercise tolerance EKG shows sinus bradycardia but otherwise normal No changes needed

## 2011-05-19 NOTE — Progress Notes (Signed)
Subjective:    Patient ID: Anthony Hunt, male    DOB: 1951-05-15, 60 y.o.   MRN: [id number]  HPI Has been doing well  Hasn't been to the cardiologist in a while Has not been exercising that much lately---has been working 65-70 hours per week for a year (big project in [place]) No change in stamina   Wants to do the stool immunoassay again Discussed PSA--will check but not be aggressive  No current outpatient prescriptions on file prior to visit.    No Known Allergies  Past Medical History  Diagnosis Date  . Allergy   . CAD (coronary artery disease)   . Hyperlipidemia     Past Surgical History  Procedure Date  . Cervical disc surgery   . US echocardiography      mild inf hypokinesis, EF 55%  09/05  . Ulnar nerve repair     Left ulnar nerve decompression 02/07    Family History  Problem Relation Age of Onset  . Coronary artery disease Mother   . Peripheral vascular disease Mother   . Cancer Maternal Uncle   . Cancer Paternal Aunt   . Diabetes Neg Hx   . Hypertension Neg Hx     History   Social History  . Marital Status: Married    Spouse Name: N/A    Number of Children: 1  . Years of Education: N/A   Occupational History  . [employer]    Social History Main Topics  . Smoking status: Never Smoker   . Smokeless tobacco: Never Used  . Alcohol Use: No  . Drug Use: No  . Sexually Active: Not on file   Other Topics Concern  . Not on file   Social History Narrative  . No narrative on file   Review of Systems  Constitutional: Negative for fatigue and unexpected weight change.       Wears seat belt  HENT: Negative for hearing loss, dental problem and tinnitus.        No recent problems with allergies Regular with dentist  Eyes: Negative for visual disturbance.       No diplopia or unilateral vision loss   Respiratory: Negative for cough, chest tightness and shortness of breath.   Cardiovascular: Negative for chest pain, palpitations  and leg swelling.  Gastrointestinal: Negative for nausea, vomiting, abdominal pain, constipation and blood in stool.       No heartburn  Genitourinary: Negative for urgency, frequency and difficulty urinating.       No sexual problems  Musculoskeletal: Positive for myalgias. Negative for back pain, joint swelling and arthralgias.       Occ mild achiness  Skin: Negative for rash.       No suspicious areas   Neurological: Negative for dizziness, syncope, weakness, light-headedness, numbness and headaches.  Hematological: Negative for adenopathy. Does not bruise/bleed easily.  Psychiatric/Behavioral: Negative for sleep disturbance and dysphoric mood. The patient is not nervous/anxious.        Objective:   Physical Exam  Constitutional: He is oriented to person, place, and time. He appears well-developed and well-nourished.  HENT:  Head: Normocephalic and atraumatic.  Right Ear: External ear normal.  Left Ear: External ear normal.  Mouth/Throat: Oropharynx is clear and moist. No oropharyngeal exudate.  Eyes: Conjunctivae and EOM are normal. Pupils are equal, round, and reactive to light.  Neck: Normal range of motion. Neck supple. No thyromegaly present.  Cardiovascular: Normal rate, regular rhythm, normal heart sounds and intact  distal pulses.  Exam reveals no gallop.   No murmur heard. Pulmonary/Chest: Effort normal and breath sounds normal. No respiratory distress. He has no wheezes. He has no rales.  Abdominal: Soft. Bowel sounds are normal. There is no tenderness.  Musculoskeletal: Normal range of motion. He exhibits no edema and no tenderness.  Lymphadenopathy:    He has no cervical adenopathy.  Neurological: He is alert and oriented to person, place, and time.  Skin: Skin is warm. No rash noted.  Psychiatric: He has a normal mood and affect. His behavior is normal. Judgment and thought content normal.          Assessment & Plan:

## 2011-05-20 LAB — HEPATIC FUNCTION PANEL
AST: 25 U/L (ref 0–37)
Albumin: 4.1 g/dL (ref 3.5–5.2)
Alkaline Phosphatase: 48 U/L (ref 39–117)
Bilirubin, Direct: 0.1 mg/dL (ref 0.0–0.3)

## 2011-05-20 LAB — CBC WITH DIFFERENTIAL/PLATELET
Eosinophils Relative: 2.5 % (ref 0.0–5.0)
HCT: 48.3 % (ref 39.0–52.0)
Hemoglobin: 16.6 g/dL (ref 13.0–17.0)
Lymphocytes Relative: 24.2 % (ref 12.0–46.0)
Lymphs Abs: 1.6 10*3/uL (ref 0.7–4.0)
Monocytes Relative: 3.9 % (ref 3.0–12.0)
Platelets: 163 10*3/uL (ref 150.0–400.0)
WBC: 6.8 10*3/uL (ref 4.5–10.5)

## 2011-05-20 LAB — BASIC METABOLIC PANEL
GFR: 60.75 mL/min (ref >60.00)
Potassium: 4.9 mEq/L (ref 3.5–5.1)
Sodium: 142 mEq/L (ref 135–145)

## 2011-05-20 LAB — LIPID PANEL
LDL Cholesterol: 87 mg/dL (ref 0–99)
Total CHOL/HDL Ratio: 4

## 2011-05-20 LAB — TSH: TSH: 1.13 u[IU]/mL (ref 0.35–5.50)

## 2011-06-07 ENCOUNTER — Other Ambulatory Visit: Payer: BC Managed Care – PPO

## 2011-06-07 ENCOUNTER — Encounter: Payer: Self-pay | Admitting: *Deleted

## 2011-06-07 ENCOUNTER — Other Ambulatory Visit: Payer: Self-pay | Admitting: Internal Medicine

## 2011-06-07 DIAGNOSIS — Z1211 Encounter for screening for malignant neoplasm of colon: Secondary | ICD-10-CM

## 2011-08-23 ENCOUNTER — Ambulatory Visit (INDEPENDENT_AMBULATORY_CARE_PROVIDER_SITE_OTHER): Payer: BC Managed Care – PPO | Admitting: Family Medicine

## 2011-08-23 ENCOUNTER — Encounter: Payer: Self-pay | Admitting: Family Medicine

## 2011-08-23 ENCOUNTER — Telehealth: Payer: Self-pay | Admitting: Internal Medicine

## 2011-08-23 DIAGNOSIS — IMO0002 Reserved for concepts with insufficient information to code with codable children: Secondary | ICD-10-CM

## 2011-08-23 MED ORDER — VALACYCLOVIR HCL 1 G PO TABS: 1000.0000 mg | ORAL_TABLET | Freq: Three times a day (TID) | ORAL | Status: DC

## 2011-08-23 MED ORDER — HYDROCODONE-ACETAMINOPHEN 5-500 MG PO TABS: 1.0000 | ORAL_TABLET | Freq: Three times a day (TID) | ORAL | Status: DC | PRN

## 2011-08-23 NOTE — Telephone Encounter (Signed)
Triage Record Num: [id number] Operator: Chevis Pretty Patient Name: Anthony Hunt Call Date & Time: 08/23/2011 8:29:03AM Patient Phone: [phone] PCP: Tillman Abide Patient Gender: Male PCP Fax : [phone] Patient DOB: 29-Mar-1951 Practice Name: [facility] Reason for Call: Caller: [name]/Patient; PCP: Tillman Abide I.; CB#: [phone]; ; ; Call regarding Patient Having 4th Episode of Shingles, Now It Is Around His Upper Leg, Very Painful. Onset 08/20/11. States encircles L thigh near groin, and states "can't stand to have clothing touch it." States has had episodes involving either side of upper legs, L shoulder and chest. States no rash but has exquisite tenderness to the skin. Afebrile. Per protocol, emergent symptoms denied; appt sched in Epic with staff assistance for 08/23/11 0915 with Dr. Para March. Protocol(s) Used: Skin Lesions Recommended Outcome per Protocol: See Provider within 24 hours Reason for Outcome: Painful lesion(s) unrelieved with home care measures Care Advice: ~ 08/23/2011 8:43:30AM Page 1 of 1 [id number]

## 2011-08-23 NOTE — Patient Instructions (Signed)
Start the valtrex today and use the vicodin for for pain.  It can make you drowsy.  Take care.

## 2011-08-23 NOTE — Progress Notes (Signed)
H/o shingles.  Now with sx over last ~2 days.  L upper thigh.  Feels like prev shingles.  Burning, tender, sore with light touch.  No rash yet.  No FCNAV.  No trauma.  No med changes.  Has taken   Meds, vitals, and allergies reviewed.   ROS: See HPI.  Otherwise, noncontributory.  nad ncat No rash Dermatomal distribution with change in sensation in L medial thigh.

## 2011-08-25 ENCOUNTER — Encounter: Payer: Self-pay | Admitting: Family Medicine

## 2011-08-25 NOTE — Assessment & Plan Note (Signed)
Presumed shingles, start valtrex and f/u prn.  He agrees.

## 2011-08-29 ENCOUNTER — Other Ambulatory Visit: Payer: Self-pay | Admitting: *Deleted

## 2011-08-29 MED ORDER — VALACYCLOVIR HCL 1 G PO TABS: 1000.0000 mg | ORAL_TABLET | Freq: Three times a day (TID) | ORAL | Status: AC

## 2011-08-29 MED ORDER — HYDROCODONE-ACETAMINOPHEN 5-500 MG PO TABS: 1.0000 | ORAL_TABLET | Freq: Three times a day (TID) | ORAL | Status: AC | PRN

## 2011-08-29 NOTE — Telephone Encounter (Signed)
Valtrex sent, please call in vicodin.  If not improved, then have patient f/u here in clinic.  Thanks.

## 2011-08-29 NOTE — Telephone Encounter (Signed)
Medication phoned to pharmacy. Patient advised.  

## 2011-08-29 NOTE — Telephone Encounter (Signed)
Patient called stating that he was seen last week and diagnosed with shingles. Patient states that he has not developed a rash, but still is having some pain and pain level is about 6-7 . Patient wants to know if you want to refill his Valtrex and Hydrocodone? Patient states that he has had the shingles several times and has never developed a rash. Pharmacy [facility]

## 2012-07-04 ENCOUNTER — Other Ambulatory Visit: Payer: Self-pay | Admitting: *Deleted

## 2012-07-04 MED ORDER — BENAZEPRIL HCL 10 MG PO TABS: 10.0000 mg | ORAL_TABLET | Freq: Every day | ORAL | Status: DC

## 2012-07-04 MED ORDER — METOPROLOL TARTRATE 25 MG PO TABS: 12.5000 mg | ORAL_TABLET | Freq: Two times a day (BID) | ORAL | Status: DC

## 2012-07-04 MED ORDER — SIMVASTATIN 80 MG PO TABS: 80.0000 mg | ORAL_TABLET | Freq: Every day | ORAL | Status: DC

## 2012-08-16 ENCOUNTER — Encounter: Payer: Self-pay | Admitting: Internal Medicine

## 2012-08-16 ENCOUNTER — Ambulatory Visit (INDEPENDENT_AMBULATORY_CARE_PROVIDER_SITE_OTHER): Payer: BC Managed Care – PPO | Admitting: Internal Medicine

## 2012-08-16 VITALS — BP 128/84 | HR 62 | Temp 98.2°F | Ht 68.0 in | Wt 234.8 lb

## 2012-08-16 DIAGNOSIS — Z1211 Encounter for screening for malignant neoplasm of colon: Secondary | ICD-10-CM

## 2012-08-16 DIAGNOSIS — Z23 Encounter for immunization: Secondary | ICD-10-CM

## 2012-08-16 DIAGNOSIS — E669 Obesity, unspecified: Secondary | ICD-10-CM

## 2012-08-16 DIAGNOSIS — I2510 Atherosclerotic heart disease of native coronary artery without angina pectoris: Secondary | ICD-10-CM

## 2012-08-16 DIAGNOSIS — E785 Hyperlipidemia, unspecified: Secondary | ICD-10-CM

## 2012-08-16 DIAGNOSIS — Z0000 Encounter for general adult medical examination without abnormal findings: Secondary | ICD-10-CM

## 2012-08-16 MED ORDER — BENAZEPRIL HCL 10 MG PO TABS: 10.0000 mg | ORAL_TABLET | Freq: Every day | ORAL | Status: DC

## 2012-08-16 MED ORDER — SIMVASTATIN 80 MG PO TABS: 80.0000 mg | ORAL_TABLET | Freq: Every day | ORAL | Status: DC

## 2012-08-16 MED ORDER — METOPROLOL TARTRATE 25 MG PO TABS: 12.5000 mg | ORAL_TABLET | Freq: Two times a day (BID) | ORAL | Status: DC

## 2012-08-16 NOTE — Assessment & Plan Note (Signed)
No problems with the med Due for labs 

## 2012-08-16 NOTE — Progress Notes (Signed)
Subjective:    Patient ID: Anthony Hunt, male    DOB: 1951-04-07, 62 y.o.   MRN: [id number]  HPI Here for physical Never broke out in rash after last visit but the pain did go away  No new concerns No heart issues Not much exercise--may walk a lot on a hunting trip rarely.  [place]  Just finished large project in [facility]  Current Outpatient Prescriptions on File Prior to Visit  Medication Sig Dispense Refill  . aspirin 81 MG tablet Take 81 mg by mouth daily.        . benazepril (LOTENSIN) 10 MG tablet Take 1 tablet (10 mg total) by mouth daily.  90 tablet  0  . metoprolol tartrate (LOPRESSOR) 25 MG tablet Take 0.5 tablets (12.5 mg total) by mouth 2 (two) times daily.  180 tablet  0  . simvastatin (ZOCOR) 80 MG tablet Take 1 tablet (80 mg total) by mouth at bedtime.  90 tablet  0    No Known Allergies  Past Medical History  Diagnosis Date  . Allergy   . CAD (coronary artery disease) 2005    MI  . Hyperlipidemia     Past Surgical History  Procedure Date  . Cervical disc surgery   . US echocardiography      mild inf hypokinesis, EF 55%  09/05  . Ulnar nerve repair     Left ulnar nerve decompression 02/07  . Coronary stent placement 2005    Family History  Problem Relation Age of Onset  . Coronary artery disease Mother   . Peripheral vascular disease Mother   . Cancer Maternal Uncle   . Cancer Paternal Aunt   . Diabetes Neg Hx   . Hypertension Neg Hx     History   Social History  . Marital Status: Married    Spouse Name: N/A    Number of Children: 1  . Years of Education: N/A   Occupational History  . [employer]    Social History Main Topics  . Smoking status: Never Smoker   . Smokeless tobacco: Never Used  . Alcohol Use: No  . Drug Use: No  . Sexually Active: Not on file   Other Topics Concern  . Not on file   Social History Narrative  . No narrative on file   Review of Systems  Constitutional: Negative for  fatigue and unexpected weight change.       Wears seat belt  HENT: Negative for hearing loss, congestion, dental problem and tinnitus.        Regular with dentist  Eyes: Negative for visual disturbance.       No diplopia or unilateral vision loss  Respiratory: Negative for cough, chest tightness and shortness of breath.   Cardiovascular: Negative for chest pain, palpitations and leg swelling.  Gastrointestinal: Negative for nausea, vomiting, abdominal pain, constipation and blood in stool.       Occasional heartburn--rolaids helps (if spicy food). No more than once a month  Genitourinary: Negative for difficulty urinating.       No dribbling Rare nocturia No sexual problems  Musculoskeletal: Negative for back pain, joint swelling and arthralgias.  Skin: Negative for rash.       No suspicious lesions  Neurological: Negative for dizziness, syncope, weakness, light-headedness, numbness and headaches.  Hematological: Negative for adenopathy. Does not bruise/bleed easily.  Psychiatric/Behavioral: Negative for sleep disturbance and dysphoric mood. The patient is not nervous/anxious.  Objective:   Physical Exam  Constitutional: He is oriented to person, place, and time. He appears well-developed and well-nourished. No distress.  HENT:  Head: Normocephalic and atraumatic.  Right Ear: External ear normal.  Left Ear: External ear normal.  Mouth/Throat: Oropharynx is clear and moist. No oropharyngeal exudate.  Eyes: Conjunctivae normal and EOM are normal. Pupils are equal, round, and reactive to light.  Neck: Normal range of motion. Neck supple. No thyromegaly present.  Cardiovascular: Normal rate, regular rhythm, normal heart sounds and intact distal pulses.  Exam reveals no gallop.   No murmur heard. Pulmonary/Chest: Effort normal and breath sounds normal. No respiratory distress. He has no wheezes. He has no rales.  Abdominal: Soft. There is no tenderness.  Musculoskeletal: He  exhibits no edema and no tenderness.  Lymphadenopathy:    He has no cervical adenopathy.  Neurological: He is alert and oriented to person, place, and time.  Skin: No rash noted. No erythema.       Benign nevi  Psychiatric: He has a normal mood and affect. His behavior is normal.          Assessment & Plan:

## 2012-08-16 NOTE — Assessment & Plan Note (Signed)
Healthy but out of shape We will defer PSA to next year Stool immunoassay

## 2012-08-16 NOTE — Patient Instructions (Signed)

## 2012-08-16 NOTE — Assessment & Plan Note (Signed)
Seems to be quiet No symptoms EKG unchanged from last time Continue same meds

## 2012-08-16 NOTE — Assessment & Plan Note (Signed)
Discussed fitness and eating DASH diet info given

## 2012-08-17 LAB — BASIC METABOLIC PANEL
CO2: 27 mEq/L (ref 19–32)
Calcium: 9.2 mg/dL (ref 8.4–10.5)
Creatinine, Ser: 1.1 mg/dL (ref 0.4–1.5)
GFR: 71.21 mL/min (ref >60.00)
Sodium: 139 mEq/L (ref 135–145)

## 2012-08-17 LAB — CBC WITH DIFFERENTIAL/PLATELET
Basophils Relative: 0.3 % (ref 0.0–3.0)
Eosinophils Relative: 3.8 % (ref 0.0–5.0)
Hemoglobin: 17 g/dL (ref 13.0–17.0)
Lymphocytes Relative: 25.3 % (ref 12.0–46.0)
Monocytes Relative: 5.7 % (ref 3.0–12.0)
Neutro Abs: 4.3 10*3/uL (ref 1.4–7.7)
Neutrophils Relative %: 64.9 % (ref 43.0–77.0)
RBC: 5.13 Mil/uL (ref 4.22–5.81)
WBC: 6.7 10*3/uL (ref 4.5–10.5)

## 2012-08-17 LAB — HEPATIC FUNCTION PANEL
ALT: 31 U/L (ref 0–53)
Alkaline Phosphatase: 43 U/L (ref 39–117)
Bilirubin, Direct: 0.1 mg/dL (ref 0.0–0.3)
Total Bilirubin: 1 mg/dL (ref 0.3–1.2)
Total Protein: 7.1 g/dL (ref 6.0–8.3)

## 2012-08-17 LAB — LIPID PANEL
HDL: 34 mg/dL — ABNORMAL LOW (ref >39.00)
LDL Cholesterol: 94 mg/dL (ref 0–99)
Total CHOL/HDL Ratio: 5
VLDL: 25 mg/dL (ref 0.0–40.0)

## 2012-09-11 ENCOUNTER — Other Ambulatory Visit (INDEPENDENT_AMBULATORY_CARE_PROVIDER_SITE_OTHER): Payer: BC Managed Care – PPO

## 2012-09-11 DIAGNOSIS — Z1211 Encounter for screening for malignant neoplasm of colon: Secondary | ICD-10-CM

## 2012-10-01 ENCOUNTER — Other Ambulatory Visit: Payer: Self-pay | Admitting: Internal Medicine

## 2012-12-31 ENCOUNTER — Other Ambulatory Visit: Payer: Self-pay | Admitting: Internal Medicine

## 2013-05-23 ENCOUNTER — Other Ambulatory Visit: Payer: Self-pay

## 2013-08-08 ENCOUNTER — Encounter: Payer: Self-pay | Admitting: Family Medicine

## 2013-08-08 ENCOUNTER — Ambulatory Visit (INDEPENDENT_AMBULATORY_CARE_PROVIDER_SITE_OTHER): Payer: BC Managed Care – PPO | Admitting: Family Medicine

## 2013-08-08 VITALS — BP 130/70 | HR 74 | Temp 98.2°F | Wt 248.5 lb

## 2013-08-08 DIAGNOSIS — J019 Acute sinusitis, unspecified: Secondary | ICD-10-CM

## 2013-08-08 DIAGNOSIS — B9689 Other specified bacterial agents as the cause of diseases classified elsewhere: Secondary | ICD-10-CM

## 2013-08-08 DIAGNOSIS — H109 Unspecified conjunctivitis: Secondary | ICD-10-CM

## 2013-08-08 MED ORDER — AMOXICILLIN-POT CLAVULANATE 875-125 MG PO TABS: 1.0000 | ORAL_TABLET | Freq: Two times a day (BID) | ORAL | Status: AC

## 2013-08-08 MED ORDER — GUAIFENESIN-CODEINE 100-10 MG/5ML PO SYRP: 5.0000 mL | ORAL_SOLUTION | Freq: Two times a day (BID) | ORAL | Status: DC | PRN

## 2013-08-08 NOTE — Assessment & Plan Note (Signed)
With story of deterioration after initial improvement, anticipate has developed bacterial sinus infection. Treat with augmentin course. cheratussin for cough at night time. Push fluids and rest.

## 2013-08-08 NOTE — Progress Notes (Signed)
Pre-visit discussion using our clinic review tool. No additional management support is needed unless otherwise documented below in the visit note.  

## 2013-08-08 NOTE — Patient Instructions (Signed)
I think you have bacterial sinus infection - treat with augmentin course for 10 days. Push fluids and plenty of rest. Nasal saline irrigation or neti pot to help drain sinuses. May use plain mucinex or immediate release guaifenesin with plenty of fluid to help mobilize mucous. For eye - use cool compresses.  Avoid contacts for 1 + weeks.  If worsening pain of eye or worsening discharge, call us for eye drops. Let us know if fever >101.5, trouble opening/closing mouth, difficulty swallowing, or worsening - you may need to be seen again.

## 2013-08-08 NOTE — Progress Notes (Signed)
   Subjective:    Patient ID: Anthony Hunt, male    DOB: 1950-09-17, 63 y.o.   MRN: [id number]  HPI CC: URI?  3 wk h/o cold sxs - improved except for cough.  Now this week cough has progressively worsening, now with head fullness/congestion and ST.  Productive cough of clear mucous.  Head > chest congestion.  Some PNDrainage. This evening noticing more redness in right eye with some discharge.  No pain or burning or itching. So far has tried alka seltzer cold OTC remedies. No fevers/chills, abd pain, ear or tooth pain, dyspnea or wheeze, nausea. Sister in law sick recently. No smokers at home. No flu shot yet. No h/o asthma or COPD   Past Medical History  Diagnosis Date  . Allergy   . CAD (coronary artery disease) 2005    MI  . Hyperlipidemia      Review of Systems Per HPI    Objective:   Physical Exam  Nursing note and vitals reviewed. Constitutional: He appears well-developed and well-nourished. No distress.  HENT:  Head: Normocephalic and atraumatic.  Right Ear: Hearing, tympanic membrane, external ear and ear canal normal.  Left Ear: Hearing, tympanic membrane, external ear and ear canal normal.  Nose: Mucosal edema (L>R) present. No rhinorrhea. Right sinus exhibits no maxillary sinus tenderness and no frontal sinus tenderness. Left sinus exhibits no maxillary sinus tenderness and no frontal sinus tenderness.  Mouth/Throat: Uvula is midline. Posterior oropharyngeal erythema present. No oropharyngeal exudate, posterior oropharyngeal edema or tonsillar abscesses.  Nasal mucosal congestion L>R  Eyes: EOM are normal. Pupils are equal, round, and reactive to light. Right eye exhibits discharge. Right conjunctiva is injected. No scleral icterus.  R bulbar conjunctival injection with mild yellow discharge present  Neck: Normal range of motion. Neck supple.  Cardiovascular: Normal rate, regular rhythm, normal heart sounds and intact distal pulses.   No murmur  heard. Pulmonary/Chest: Effort normal and breath sounds normal. No respiratory distress. He has no wheezes. He has no rales.  Lymphadenopathy:    He has no cervical adenopathy.  Skin: Skin is warm and dry. No rash noted.       Assessment & Plan:

## 2013-08-08 NOTE — Assessment & Plan Note (Signed)
R sided - likely viral pink eye.   Discussed supportive care. Avoid contacts for 1-2 weeks. Update if sxs persist or worsen.

## 2013-08-16 ENCOUNTER — Ambulatory Visit (INDEPENDENT_AMBULATORY_CARE_PROVIDER_SITE_OTHER): Payer: BC Managed Care – PPO | Admitting: Internal Medicine

## 2013-08-16 ENCOUNTER — Encounter: Payer: Self-pay | Admitting: Internal Medicine

## 2013-08-16 VITALS — BP 120/80 | HR 58 | Temp 98.6°F | Ht 68.0 in | Wt 242.0 lb

## 2013-08-16 DIAGNOSIS — E785 Hyperlipidemia, unspecified: Secondary | ICD-10-CM

## 2013-08-16 DIAGNOSIS — E669 Obesity, unspecified: Secondary | ICD-10-CM

## 2013-08-16 DIAGNOSIS — G4733 Obstructive sleep apnea (adult) (pediatric): Secondary | ICD-10-CM | POA: Insufficient documentation

## 2013-08-16 DIAGNOSIS — Z125 Encounter for screening for malignant neoplasm of prostate: Secondary | ICD-10-CM

## 2013-08-16 DIAGNOSIS — Z1211 Encounter for screening for malignant neoplasm of colon: Secondary | ICD-10-CM

## 2013-08-16 DIAGNOSIS — I2510 Atherosclerotic heart disease of native coronary artery without angina pectoris: Secondary | ICD-10-CM

## 2013-08-16 DIAGNOSIS — Z0000 Encounter for general adult medical examination without abnormal findings: Secondary | ICD-10-CM

## 2013-08-16 LAB — CBC WITH DIFFERENTIAL/PLATELET
BASOS ABS: 0 10*3/uL (ref 0.0–0.1)
Basophils Relative: 0.6 % (ref 0.0–3.0)
EOS ABS: 0.3 10*3/uL (ref 0.0–0.7)
EOS PCT: 4.5 % (ref 0.0–5.0)
HCT: 50.7 % (ref 39.0–52.0)
Hemoglobin: 16.7 g/dL (ref 13.0–17.0)
Lymphocytes Relative: 22.7 % (ref 12.0–46.0)
Lymphs Abs: 1.6 10*3/uL (ref 0.7–4.0)
MCHC: 32.9 g/dL (ref 30.0–36.0)
MCV: 101.2 fl — AB (ref 78.0–100.0)
MONO ABS: 0.5 10*3/uL (ref 0.1–1.0)
Monocytes Relative: 6.8 % (ref 3.0–12.0)
NEUTROS PCT: 65.4 % (ref 43.0–77.0)
Neutro Abs: 4.7 10*3/uL (ref 1.4–7.7)
PLATELETS: 170 10*3/uL (ref 150.0–400.0)
RBC: 5.01 Mil/uL (ref 4.22–5.81)
RDW: 13.3 % (ref 11.5–14.6)
WBC: 7.3 10*3/uL (ref 4.5–10.5)

## 2013-08-16 LAB — COMPREHENSIVE METABOLIC PANEL
ALBUMIN: 3.9 g/dL (ref 3.5–5.2)
ALK PHOS: 49 U/L (ref 39–117)
ALT: 40 U/L (ref 0–53)
AST: 32 U/L (ref 0–37)
BUN: 18 mg/dL (ref 6–23)
CO2: 28 meq/L (ref 19–32)
Calcium: 9.1 mg/dL (ref 8.4–10.5)
Chloride: 106 mEq/L (ref 96–112)
Creatinine, Ser: 1.1 mg/dL (ref 0.4–1.5)
GFR: 71.71 mL/min (ref >60.00)
GLUCOSE: 85 mg/dL (ref 70–99)
POTASSIUM: 4.4 meq/L (ref 3.5–5.1)
SODIUM: 138 meq/L (ref 135–145)
TOTAL PROTEIN: 6.6 g/dL (ref 6.0–8.3)
Total Bilirubin: 0.9 mg/dL (ref 0.3–1.2)

## 2013-08-16 LAB — LIPID PANEL
CHOL/HDL RATIO: 5
Cholesterol: 138 mg/dL (ref 0–200)
HDL: 30.1 mg/dL — AB (ref >39.00)
LDL Cholesterol: 83 mg/dL (ref 0–99)
Triglycerides: 124 mg/dL (ref 0.0–149.0)
VLDL: 24.8 mg/dL (ref 0.0–40.0)

## 2013-08-16 MED ORDER — ZOSTER VACCINE LIVE 19400 UNT/0.65ML ~~LOC~~ SOLR: 0.6500 mL | Freq: Once | SUBCUTANEOUS | Status: DC

## 2013-08-16 NOTE — Assessment & Plan Note (Signed)
No symptoms On appropriate meds No cardiologist unless he has symptoms

## 2013-08-16 NOTE — Patient Instructions (Signed)
Exercise to Lose Weight Exercise and a healthy diet may help you lose weight. Your doctor may suggest specific exercises. EXERCISE IDEAS AND TIPS  Choose low-cost things you enjoy doing, such as walking, bicycling, or exercising to workout videos.  Take stairs instead of the elevator.  Walk during your lunch break.  Park your car further away from work or school.  Go to a gym or an exercise class.  Start with 5 to 10 minutes of exercise each day. Build up to 30 minutes of exercise 4 to 6 days a week.  Wear shoes with good support and comfortable clothes.  Stretch before and after working out.  Work out until you breathe harder and your heart beats faster.  Drink extra water when you exercise.  Do not do so much that you hurt yourself, feel dizzy, or get very short of breath. Exercises that burn about 150 calories:  Running 1  miles in 15 minutes.  Playing volleyball for 45 to 60 minutes.  Washing and waxing a car for 45 to 60 minutes.  Playing touch football for 45 minutes.  Walking 1  miles in 35 minutes.  Pushing a stroller 1  miles in 30 minutes.  Playing basketball for 30 minutes.  Raking leaves for 30 minutes.  Bicycling 5 miles in 30 minutes.  Walking 2 miles in 30 minutes.  Dancing for 30 minutes.  Shoveling snow for 15 minutes.  Swimming laps for 20 minutes.  Walking up stairs for 15 minutes.  Bicycling 4 miles in 15 minutes.  Gardening for 30 to 45 minutes.  Jumping rope for 15 minutes.  Washing windows or floors for 45 to 60 minutes. Document Released: 08/06/2010 Document Revised: 09/26/2011 Document Reviewed: 08/06/2010 [facility] Patient Information 2014 [place], [place]. DASH Diet The DASH diet stands for "Dietary Approaches to Stop Hypertension." It is a healthy eating plan that has been shown to reduce high blood pressure (hypertension) in as little as 14 days, while also possibly providing other significant health benefits. These other  health benefits include reducing the risk of breast cancer after menopause and reducing the risk of type 2 diabetes, heart disease, colon cancer, and stroke. Health benefits also include weight loss and slowing kidney failure in patients with chronic kidney disease.  DIET GUIDELINES  Limit salt (sodium). Your diet should contain less than 1500 mg of sodium daily.  Limit refined or processed carbohydrates. Your diet should include mostly whole grains. Desserts and added sugars should be used sparingly.  Include small amounts of heart-healthy fats. These types of fats include nuts, oils, and tub margarine. Limit saturated and trans fats. These fats have been shown to be harmful in the body. CHOOSING FOODS  The following food groups are based on a 2000 calorie diet. See your Registered Dietitian for individual calorie needs. Grains and Grain Products (6 to 8 servings daily)  Eat More Often: Whole-wheat bread, brown rice, whole-grain or wheat pasta, quinoa, popcorn without added fat or salt (air popped).  Eat Less Often: White bread, white pasta, white rice, cornbread. Vegetables (4 to 5 servings daily)  Eat More Often: Fresh, frozen, and canned vegetables. Vegetables may be raw, steamed, roasted, or grilled with a minimal amount of fat.  Eat Less Often/Avoid: Creamed or fried vegetables. Vegetables in a cheese sauce. Fruit (4 to 5 servings daily)  Eat More Often: All fresh, canned (in natural juice), or frozen fruits. Dried fruits without added sugar. One hundred percent fruit juice ( cup [237 mL] daily).  Eat Less Often: Dried fruits with added sugar. Canned fruit in light or heavy syrup. [employer], Fish, and Poultry (2 servings or less daily. One serving is 3 to 4 oz [85-114 g]).  Eat More Often: Ninety percent or leaner ground beef, tenderloin, sirloin. Round cuts of beef, chicken breast, [place] breast. All fish. Grill, bake, or broil your meat. Nothing should be fried.  Eat Less  Often/Avoid: Fatty cuts of meat, [place], or chicken leg, thigh, or wing. Fried cuts of meat or fish. Dairy (2 to 3 servings)  Eat More Often: Low-fat or fat-free milk, low-fat plain or light yogurt, reduced-fat or part-skim cheese.  Eat Less Often/Avoid: Milk (whole, 2%).Whole milk yogurt. Full-fat cheeses. Nuts, Seeds, and Legumes (4 to 5 servings per week)  Eat More Often: All without added salt.  Eat Less Often/Avoid: Salted nuts and seeds, canned beans with added salt. Fats and Sweets (limited)  Eat More Often: Vegetable oils, tub margarines without trans fats, sugar-free gelatin. Mayonnaise and salad dressings.  Eat Less Often/Avoid: Coconut oils, palm oils, butter, stick margarine, cream, half and half, cookies, candy, pie. FOR MORE INFORMATION The Dash Diet Eating Plan: www.dashdiet.org Document Released: 06/23/2011 Document Revised: 09/26/2011 Document Reviewed: 06/23/2011 [facility] Patient Information 2014 [place], [place].

## 2013-08-16 NOTE — Progress Notes (Signed)
Subjective:    Patient ID: Anthony Hunt, male    DOB: 1951/03/10, 63 y.o.   MRN: [id number]  HPI Here for physical Doing well in general Has lost some high school classmates--realizes he needs to be more proactive with health  No heart problems Stent was 2005 Doesn't see cardiologist  Current Outpatient Prescriptions on File Prior to Visit  Medication Sig Dispense Refill  . amoxicillin-clavulanate (AUGMENTIN) 875-125 MG per tablet Take 1 tablet by mouth 2 (two) times daily.  20 tablet  0  . aspirin 81 MG tablet Take 81 mg by mouth daily.        . benazepril (LOTENSIN) 10 MG tablet TAKE 1 TABLET (10 MG TOTAL) BY MOUTH DAILY.  90 tablet  3  . guaiFENesin-codeine (ROBITUSSIN AC) 100-10 MG/5ML syrup Take 5 mLs by mouth 2 (two) times daily as needed for cough.  180 mL  0  . metoprolol tartrate (LOPRESSOR) 25 MG tablet TAKE 1/2 TABLET BY MOUTH 2 TIMES A DAY  90 tablet  3  . simvastatin (ZOCOR) 80 MG tablet Take 1 tablet (80 mg total) by mouth at bedtime.  90 tablet  3   No current facility-administered medications on file prior to visit.    No Known Allergies  Past Medical History  Diagnosis Date  . Allergy   . CAD (coronary artery disease) 2005    MI  . Hyperlipidemia     Past Surgical History  Procedure Laterality Date  . Cervical disc surgery    . US echocardiography       mild inf hypokinesis, EF 55%  09/05  . Ulnar nerve repair      Left ulnar nerve decompression 02/07  . Coronary stent placement  2005    Family History  Problem Relation Age of Onset  . Coronary artery disease Mother   . Peripheral vascular disease Mother   . Cancer Maternal Uncle   . Cancer Paternal Aunt   . Diabetes Neg Hx   . Hypertension Neg Hx     History   Social History  . Marital Status: Married    Spouse Name: N/A    Number of Children: 1  . Years of Education: N/A   Occupational History  . [employer]    Social History Main Topics  . Smoking status: Never Smoker    . Smokeless tobacco: Never Used  . Alcohol Use: No  . Drug Use: No  . Sexual Activity: Not on file   Other Topics Concern  . Not on file   Social History Narrative  . No narrative on file   Review of Systems  Constitutional: Negative for fatigue and unexpected weight change.       Wears seat belt  HENT: Negative for dental problem, hearing loss and tinnitus.        Regular with dentist  Eyes: Negative for visual disturbance.       No diplopia or unilateral vision loss  Respiratory: Negative for cough, chest tightness and shortness of breath.   Cardiovascular: Negative for chest pain, palpitations and leg swelling.  Gastrointestinal: Negative for nausea, vomiting, abdominal pain, constipation and blood in stool.       Rare heartburn-- uses tums or similar works (every 2 months)  Endocrine: Negative for cold intolerance and heat intolerance.  Genitourinary: Negative for urgency, frequency and difficulty urinating.       No sexual problems  Musculoskeletal: Negative for arthralgias, back pain and joint swelling.  Skin: Negative for  rash.       No suspicious lesions   Allergic/Immunologic: Negative for environmental allergies and immunocompromised state.  Neurological: Negative for dizziness, syncope, weakness, light-headedness, numbness and headaches.  Hematological: Negative for adenopathy. Does not bruise/bleed easily.  Psychiatric/Behavioral: Positive for sleep disturbance. Negative for dysphoric mood. The patient is not nervous/anxious.        Wife notes that he snorts and stops breathing at times Will get strangled according to her Groggy some mornings---will awaken tired some days No major sleep pressure       Objective:   Physical Exam  Constitutional: He is oriented to person, place, and time. He appears well-developed and well-nourished. No distress.  HENT:  Head: Normocephalic and atraumatic.  Right Ear: External ear normal.  Left Ear: External ear normal.    Mouth/Throat: Oropharynx is clear and moist. No oropharyngeal exudate.  Eyes: Conjunctivae and EOM are normal. Pupils are equal, round, and reactive to light.  Neck: Normal range of motion. Neck supple. No thyromegaly present.  Cardiovascular: Normal rate, regular rhythm, normal heart sounds and intact distal pulses.  Exam reveals no gallop.   No murmur heard. Pulmonary/Chest: Effort normal and breath sounds normal. No respiratory distress. He has no wheezes. He has no rales.  Abdominal: Soft. There is no tenderness.  Musculoskeletal: He exhibits no edema and no tenderness.  Lymphadenopathy:    He has no cervical adenopathy.  Neurological: He is alert and oriented to person, place, and time.  Skin: No rash noted. No erythema.  Psychiatric: He has a normal mood and affect. His behavior is normal.          Assessment & Plan:

## 2013-08-16 NOTE — Progress Notes (Signed)
Pre-visit discussion using our clinic review tool. No additional management support is needed unless otherwise documented below in the visit note.  

## 2013-08-16 NOTE — Addendum Note (Signed)
Addended by: Viviana Simpler I on: 08/16/2013 02:08 PM   Modules accepted: Orders

## 2013-08-16 NOTE — Assessment & Plan Note (Signed)
Lifestyle info given

## 2013-08-16 NOTE — Addendum Note (Signed)
Addended by: Despina Hidden on: 08/16/2013 12:49 PM   Modules accepted: Orders

## 2013-08-16 NOTE — Assessment & Plan Note (Signed)
Healthy but really needs to work on fitness Will check PSA this year after discussion Fecal immunoassay again

## 2013-08-16 NOTE — Assessment & Plan Note (Signed)
Fairly classic history with apnea, gasping Not that much daytime problems Given his CAD, he should have further evaluation

## 2013-08-19 LAB — TSH: TSH: 1.16 u[IU]/mL (ref 0.35–5.50)

## 2013-08-19 LAB — PSA: PSA: 1.69 ng/mL (ref 0.10–4.00)

## 2013-08-19 LAB — T4, FREE: FREE T4: 0.9 ng/dL (ref 0.60–1.60)

## 2013-08-22 ENCOUNTER — Telehealth: Payer: Self-pay

## 2013-08-22 NOTE — Telephone Encounter (Signed)
Reviewed labwork - plz notify overall ok.  Good chol somewhat low - rec increased aerobic exercise and weight loss to help this.  Rest may await Dr. Alla German return.

## 2013-08-22 NOTE — Telephone Encounter (Signed)
Patient advised.  Patient is asking about referral for sleep study.  I advised that the referral has been placed and the referral coordinators will likely be in touch within a few days.

## 2013-08-22 NOTE — Telephone Encounter (Signed)
Pt left v/m checking on status of referral for sleep study.pt request cb.

## 2013-08-22 NOTE — Telephone Encounter (Signed)
Pt left v/m requesting lab results from 08/16/13. Pt request cb.(Dr Silvio Pate out of office)

## 2013-08-22 NOTE — Telephone Encounter (Signed)
Pt left v/m requesting cb. 

## 2013-08-24 NOTE — Telephone Encounter (Signed)
Will send to [facility] for her info

## 2013-09-12 ENCOUNTER — Institutional Professional Consult (permissible substitution): Payer: BC Managed Care – PPO | Admitting: Pulmonary Disease

## 2013-09-25 ENCOUNTER — Other Ambulatory Visit: Payer: Self-pay | Admitting: Internal Medicine

## 2013-10-07 ENCOUNTER — Ambulatory Visit (INDEPENDENT_AMBULATORY_CARE_PROVIDER_SITE_OTHER): Payer: BC Managed Care – PPO | Admitting: Pulmonary Disease

## 2013-10-07 ENCOUNTER — Encounter: Payer: Self-pay | Admitting: Pulmonary Disease

## 2013-10-07 VITALS — BP 164/90 | HR 60 | Temp 97.2°F | Ht 69.0 in | Wt 249.0 lb

## 2013-10-07 DIAGNOSIS — G4733 Obstructive sleep apnea (adult) (pediatric): Secondary | ICD-10-CM

## 2013-10-07 NOTE — Progress Notes (Deleted)
   Subjective:    Patient ID: Anthony Hunt, male    DOB: 1951/05/08, 63 y.o.   MRN: [id number]  HPI    Review of Systems  Constitutional: Negative for fever and unexpected weight change.  HENT: Positive for sore throat. Negative for congestion, dental problem, ear pain, nosebleeds, postnasal drip, rhinorrhea, sinus pressure, sneezing and trouble swallowing.   Eyes: Negative for redness and itching.  Respiratory: Negative for cough, chest tightness, shortness of breath and wheezing.   Cardiovascular: Negative for palpitations and leg swelling.  Gastrointestinal: Negative for nausea and vomiting.  Genitourinary: Negative for dysuria.  Musculoskeletal: Negative for joint swelling.  Skin: Negative for rash.  Neurological: Negative for headaches.  Hematological: Does not bruise/bleed easily.  Psychiatric/Behavioral: Negative for dysphoric mood. The patient is not nervous/anxious.        Objective:   Physical Exam        Assessment & Plan:

## 2013-10-07 NOTE — Progress Notes (Signed)
Chief Complaint  Patient presents with  . Sleep Consult    referred by Dr. Silvio Pate for snoring and apnea during the night per pt's wife. EPWORTH=15    History of Present Illness: Anthony Hunt is a 63 y.o. male for evaluation of sleep problems.  His wife has been concerned about his breathing while asleep for years.  This has been getting worse.  He is a loud snorer, and he will stop breathing while asleep.  He has to sleep in a recliner >> this is due to back pain, but also difficulty with breathing.  He feels tired in the morning, and will fall asleep easily watching TV or reading.  He has trouble staying awake in the afternoon.  His throat gets sore in the morning.  He sometimes gets cramps in his legs.  He drinks 2 cups of coffee in the morning.  He goes to sleep at 11 pm.  He falls asleep quickly.  He wakes up two times to use the bathroom.  He gets out of bed at 630 am.  He feels tired in the morning.  He denies morning headache.  He does not use anything to help him fall sleep.  He denies sleep walking, sleep talking, bruxism, or nightmares.  There is no history of restless legs.  He denies sleep hallucinations, sleep paralysis, or cataplexy. occ cramps  The Epworth score is 15 out of 24.  Anthony Hunt  has a past medical history of Allergy; CAD (coronary artery disease) (2005); and Hyperlipidemia.  Anthony Hunt  has past surgical history that includes Cervical disc surgery; US echocardiography; Ulnar nerve repair; and Coronary stent placement (2005).  Prior to Admission medications   Medication Sig Start Date End Date Taking? Authorizing Provider  aspirin 81 MG tablet Take 81 mg by mouth daily.     Yes Historical Provider, MD  benazepril (LOTENSIN) 10 MG tablet TAKE 1 TABLET (10 MG TOTAL) BY MOUTH DAILY.   Yes Venia Carbon, MD  guaiFENesin-codeine [facility]) 100-10 MG/5ML syrup Take 5 mLs by mouth 2 (two) times daily as needed for cough. 08/08/13  Yes Ria Bush, MD  metoprolol tartrate (LOPRESSOR) 25 MG tablet TAKE 1/2 TABLET BY MOUTH 2 TIMES A DAY 12/31/12  Yes Venia Carbon, MD  simvastatin (ZOCOR) 80 MG tablet TAKE 1 TABLET (80 MG TOTAL) BY MOUTH AT BEDTIME.   Yes Venia Carbon, MD  simvastatin (ZOCOR) 80 MG tablet Take 1 tablet (80 mg total) by mouth at bedtime. 08/16/12 08/16/13  Venia Carbon, MD    No Known Allergies  His family history includes Cancer in his maternal uncle and paternal aunt; Coronary artery disease in his mother; Peripheral vascular disease in his mother. There is no history of Diabetes or Hypertension.  He  reports that he has never smoked. His smokeless tobacco use includes Chew. He reports that he does not drink alcohol or use illicit drugs.  Review of Systems  Constitutional: Negative for fever and unexpected weight change.  HENT: Positive for sore throat. Negative for congestion, dental problem, ear pain, nosebleeds, postnasal drip, rhinorrhea, sinus pressure, sneezing and trouble swallowing.   Eyes: Negative for redness and itching.  Respiratory: Negative for cough, chest tightness, shortness of breath and wheezing.   Cardiovascular: Negative for palpitations and leg swelling.  Gastrointestinal: Negative for nausea and vomiting.  Genitourinary: Negative for dysuria.  Musculoskeletal: Negative for joint swelling.  Skin: Negative for rash.  Neurological: Negative for headaches.  Hematological: Does not bruise/bleed easily.  Psychiatric/Behavioral: Negative for dysphoric mood. The patient is not nervous/anxious.    Physical Exam:  General - No distress ENT - No sinus tenderness, no oral exudate, no LAN, no thyromegaly, TM clear, pupils equal/reactive, triangular uvula, MP 3, decreased AP diameter Cardiac - s1s2 regular, no murmur, pulses symmetric Chest - No wheeze/rales/dullness, good air entry, normal respiratory excursion Back - No focal tenderness Abd - Soft, non-tender, no organomegaly, +  bowel sounds Ext - No edema Neuro - Normal strength, cranial nerves intact Skin - No rashes Psych - Normal mood, and behavior  Assessment/plan:  Chesley Mires, M.D. Pager [phone]

## 2013-10-07 NOTE — Assessment & Plan Note (Signed)
He reports snoring, sleep disruption, witnessed apnea and daytime sleepiness.  His BMI is > 35.  He has history of CAD.  I am concerned he has sleep apnea.  We discussed how sleep apnea can affect various health problems including risks for hypertension, cardiovascular disease, and diabetes.  We also discussed how sleep disruption can increase risks for accident, such as while driving.  Weight loss as a means of improving sleep apnea was also reviewed.  Additional treatment options discussed were CPAP therapy, oral appliance, and surgical intervention.  Will arrange for home sleep study to further assess.

## 2013-10-07 NOTE — Patient Instructions (Signed)
Will arrange for home sleep study Will call to arrange for follow up after sleep study reviewed  

## 2013-10-21 NOTE — Telephone Encounter (Signed)
Pt was seen by Dr. Halford Chessman for Sleep eval on 10/07/13

## 2013-10-24 ENCOUNTER — Telehealth: Payer: Self-pay | Admitting: Pulmonary Disease

## 2013-10-24 NOTE — Telephone Encounter (Signed)
Patient Instructions      Will arrange for home sleep study Will call to arrange for follow up after sleep study reviewed  --  Spoke with pt. He reports we were awaiting approval from his insurance for the home sleep study. He reports he was told if he did not hear from [place] in 2 weeks to call. Please advise PCC;s thanks

## 2013-10-25 NOTE — Telephone Encounter (Signed)
Spoke with patient and advised him that [employer] has approved the hst. We pull orders and arrange for the study based off the date of the order as to be fair to all patients. Advised patient that we will should be able to contact patient next week to arrange study. Pt voiced understanding and is aware of process. Rhonda J Cobb

## 2013-11-06 ENCOUNTER — Telehealth: Payer: Self-pay | Admitting: Pulmonary Disease

## 2013-11-06 NOTE — Telephone Encounter (Signed)
Called and spoke with pt and he stated that he is still waiting to get the HST set up.  Rhonda please advise about setting this up for the pt.  thanks

## 2013-11-11 NOTE — Telephone Encounter (Signed)
Spoke with Anthony Hunt.  Home sleep test order was placed on 10/07/13.  Per Anthony Hunt, pt should get a call this wk to schedule a time to pick up the equipment.  Called, spoke with pt.  Apologized for the delay.  Advised of above per Anthony Hunt.  Pt states he called on 4/8 and was advised this had been approved by his insurance and he would receive a call the following week to schedule it.  Pt states this would have been around 4/15.  He hadn't heard anything, so he called back to check on the status on 4/22.  Pt is upset that we are now telling him that it will be another wk to hear about scheduling this given, per pt, he was advised he would be receiving a call around 4/15 to schedule.  PCCs, will you please call pt to discuss further with him.  He is requesting a call back today.  Advised I would ensure he received the call back today -- thank you.

## 2013-11-11 NOTE — Telephone Encounter (Signed)
Pt is requesting a status update & can be reached at [phone].  Satira Anis

## 2013-11-11 NOTE — Telephone Encounter (Signed)
Thank you for helping!

## 2013-11-11 NOTE — Telephone Encounter (Signed)
Called and spoke with patient and Anthony Hunt was unable to do the study this week. Appointment for Anthony Hunt to p/u HST scheduled for Monday Nov 18, 2013. Anthony Hunt advised to come to our office after 2:00 and before 5:00 and ask for [employer] [facility]. Anthony Hunt is aware. Authorization # [id number] valid from 11/11/13 to 12/11/13. Anthony Hunt is aware. Rhonda J Cobb

## 2013-11-18 DIAGNOSIS — G4733 Obstructive sleep apnea (adult) (pediatric): Secondary | ICD-10-CM

## 2013-11-19 ENCOUNTER — Telehealth: Payer: Self-pay | Admitting: Pulmonary Disease

## 2013-11-19 NOTE — Telephone Encounter (Signed)
OV has been scheduled for 11/21/13 at 12pm.

## 2013-11-19 NOTE — Telephone Encounter (Signed)
HST 11/18/13 >> AHI 64.9, SaO2 low 67%.  Obstructive and central events.  260.2 min with SaO2 < 90%.  Will have my nurse schedule ROV to review results.

## 2013-11-20 ENCOUNTER — Encounter: Payer: Self-pay | Admitting: Pulmonary Disease

## 2013-11-20 DIAGNOSIS — G4733 Obstructive sleep apnea (adult) (pediatric): Secondary | ICD-10-CM

## 2013-11-21 ENCOUNTER — Ambulatory Visit (INDEPENDENT_AMBULATORY_CARE_PROVIDER_SITE_OTHER): Payer: BC Managed Care – PPO | Admitting: Pulmonary Disease

## 2013-11-21 ENCOUNTER — Encounter: Payer: Self-pay | Admitting: Pulmonary Disease

## 2013-11-21 VITALS — BP 138/80 | HR 55 | Ht 69.0 in | Wt 244.0 lb

## 2013-11-21 DIAGNOSIS — G4739 Other sleep apnea: Secondary | ICD-10-CM

## 2013-11-21 DIAGNOSIS — IMO0002 Reserved for concepts with insufficient information to code with codable children: Secondary | ICD-10-CM | POA: Insufficient documentation

## 2013-11-21 DIAGNOSIS — G4733 Obstructive sleep apnea (adult) (pediatric): Secondary | ICD-10-CM

## 2013-11-21 DIAGNOSIS — G4736 Sleep related hypoventilation in conditions classified elsewhere: Secondary | ICD-10-CM

## 2013-11-21 NOTE — Assessment & Plan Note (Signed)
He had significant oxygen desaturation during home sleep study.  Will determine if he needs supplemental oxygen or BiPAP after review of his titration study.

## 2013-11-21 NOTE — Progress Notes (Signed)
Chief Complaint  Patient presents with  . Follow-up    Pt here to discuss sleep study results. He denies any new co's today.     History of Present Illness: Anthony Hunt is a 63 y.o. male with OSA and possible OHS.  He is here to review home sleep study.  This showed severe sleep apnea and significant oxygen desaturation.  TESTS: HST 11/18/13 >> AHI 64.9, SaO2 low 67%.  Obstructive and central events.  260.2 min with SaO2 < 90%.  Anthony Hunt  has a past medical history of Allergy; CAD (coronary artery disease) (2005); and Hyperlipidemia.  Anthony Hunt  has past surgical history that includes Cervical disc surgery; US echocardiography; Ulnar nerve repair; and Coronary stent placement (2005).  Prior to Admission medications   Medication Sig Start Date End Date Taking? Authorizing Provider  aspirin 81 MG tablet Take 81 mg by mouth daily.     Yes Historical Provider, MD  benazepril (LOTENSIN) 10 MG tablet TAKE 1 TABLET (10 MG TOTAL) BY MOUTH DAILY.   Yes Venia Carbon, MD  metoprolol tartrate (LOPRESSOR) 25 MG tablet TAKE 1/2 TABLET BY MOUTH 2 TIMES A DAY 12/31/12  Yes Venia Carbon, MD  simvastatin (ZOCOR) 80 MG tablet TAKE 1 TABLET (80 MG TOTAL) BY MOUTH AT BEDTIME.   Yes Venia Carbon, MD  simvastatin (ZOCOR) 80 MG tablet Take 1 tablet (80 mg total) by mouth at bedtime. 08/16/12 08/16/13  Venia Carbon, MD    No Known Allergies   Physical Exam:  General - No distress ENT - No sinus tenderness, no oral exudate, no LAN, triangular uvula, MP 3, decreased AP diameter Cardiac - s1s2 regular, no murmur Chest - No wheeze/rales/dullness Back - No focal tenderness Abd - Soft, non-tender Ext - No edema Neuro - Normal strength Skin - No rashes Psych - normal mood, and behavior   Assessment/Plan:  Chesley Mires, MD [place] [facility]/Critical [facility]/Sleep Pager:  [phone]

## 2013-11-21 NOTE — Patient Instructions (Signed)
Your sleep apnea number is 65 >> this is consistent with severe sleep apnea. Will arrange for CPAP titration study in sleep lab Will call with results and then schedule follow up

## 2013-11-21 NOTE — Assessment & Plan Note (Signed)
He has severe sleep apnea.  I have reviewed the recent sleep study results with the patient.  We discussed how sleep apnea can affect various health problems including risks for hypertension, cardiovascular disease, and diabetes.  We also discussed how sleep disruption can increase risks for accident, such as while driving.  Weight loss as a means of improving sleep apnea was also reviewed.  Additional treatment options discussed were CPAP therapy, oral appliance, and surgical intervention.  Will arrange for in lab CPAP titration study.

## 2013-12-01 ENCOUNTER — Ambulatory Visit (HOSPITAL_BASED_OUTPATIENT_CLINIC_OR_DEPARTMENT_OTHER): Payer: BC Managed Care – PPO | Attending: Pulmonary Disease | Admitting: Radiology

## 2013-12-01 VITALS — Ht 69.0 in | Wt 244.0 lb

## 2013-12-01 DIAGNOSIS — G4733 Obstructive sleep apnea (adult) (pediatric): Secondary | ICD-10-CM

## 2013-12-01 DIAGNOSIS — Z79899 Other long term (current) drug therapy: Secondary | ICD-10-CM | POA: Insufficient documentation

## 2013-12-01 DIAGNOSIS — Z7982 Long term (current) use of aspirin: Secondary | ICD-10-CM | POA: Insufficient documentation

## 2013-12-01 DIAGNOSIS — G4730 Sleep apnea, unspecified: Secondary | ICD-10-CM | POA: Insufficient documentation

## 2013-12-01 DIAGNOSIS — G4736 Sleep related hypoventilation in conditions classified elsewhere: Secondary | ICD-10-CM

## 2013-12-01 DIAGNOSIS — IMO0002 Reserved for concepts with insufficient information to code with codable children: Secondary | ICD-10-CM

## 2013-12-02 ENCOUNTER — Telehealth: Payer: Self-pay | Admitting: Pulmonary Disease

## 2013-12-02 DIAGNOSIS — G4733 Obstructive sleep apnea (adult) (pediatric): Secondary | ICD-10-CM

## 2013-12-02 NOTE — Sleep Study (Signed)
[place]  NAME: ETHERIDGE GEIL DATE OF BIRTH:  1951-04-18 MEDICAL RECORD NUMBER [id number]  LOCATION: [facility]  PHYSICIAN: Chesley Mires, M.D. DATE OF STUDY: 12/01/2013  SLEEP STUDY TYPE: Titration study               REFERRING PHYSICIAN: Chesley Mires, MD  INDICATION FOR STUDY:  Anthony Hunt is a 63 y.o. male with history of severe sleep apnea who presents for titration study.  He had home sleep study from 11/18/13 which showed an AHI of 64.9 and SaO2 low of 67%.  EPWORTH SLEEPINESS SCORE: 15. HEIGHT: 5\' 9"  (175.3 cm)  WEIGHT: 244 lb (110.678 kg)    Body mass index is 36.02 kg/(m^2).  NECK SIZE: 18 in.  MEDICATIONS:  Current Outpatient Prescriptions on File Prior to Visit  Medication Sig Dispense Refill  . aspirin 81 MG tablet Take 81 mg by mouth daily.        . benazepril (LOTENSIN) 10 MG tablet TAKE 1 TABLET (10 MG TOTAL) BY MOUTH DAILY.  90 tablet  3  . metoprolol tartrate (LOPRESSOR) 25 MG tablet TAKE 1/2 TABLET BY MOUTH 2 TIMES A DAY  90 tablet  3  . simvastatin (ZOCOR) 80 MG tablet Take 1 tablet (80 mg total) by mouth at bedtime.  90 tablet  3  . simvastatin (ZOCOR) 80 MG tablet TAKE 1 TABLET (80 MG TOTAL) BY MOUTH AT BEDTIME.  90 tablet  3   No current facility-administered medications on file prior to visit.    SLEEP ARCHITECTURE:  Total recording time: 371.5 minutes.  Total sleep time was: 336 minutes.  Sleep efficiency: 90.4%.  Sleep latency: 32.5 minutes.  REM latency: 40.5 minutes.  Stage N1: 2.8%.  Stage N2: 68.9%.  Stage N3: 0%.  Stage R:  28.3%.  Supine sleep: 336 minutes.  Non-supine sleep: 241 minutes.  CARDIAC DATA:  Average heart rate: 56 beats per minute. Rhythm strip: sinus rhythm with occasional PVC's.  RESPIRATORY DATA: Average respiratory rate: 20. Snoring: none with CPAP.  He was started on CPAP 5 and increased to 14 cm H2O.  With CPAP of 13 cm H2O his AHI was reduced to 0.5.  With this pressure  setting he was observed in REM and supine sleep.  MOVEMENT/PARASOMNIA:  Periodic limb movement: 51.3.  Period limb movements with arousals: 1.3. Restroom trips: none.  OXYGEN DATA:  Baseline oxygenation: 92%. Lowest SaO2: 87%. Time spent below SaO2 90%: 2.4 minutes. Supplemental oxygen used: none.  IMPRESSION/ RECOMMENDATION:   This was a successful CPAP titration study.  He did well with CPAP 13 cm H2O.  He was fitted with a medium sized ResMed airfit F10 full face mask.    Chesley Mires, M.D. Diplomate, Tax adviser of Sleep Medicine  ELECTRONICALLY SIGNED ON:  12/02/2013, 12:53 PM [place] PH: [phone]   FX: [phone] [place]

## 2013-12-02 NOTE — Telephone Encounter (Signed)
CPAP 12/01/13 >> CPAP 13 cm H2O >> AHI 0.5, +R, +S.  Will have my nurse inform pt that he did very well with CPAP 13 cm H2O.  Options are to 1) proceed with CPAP set up, and then ROV in 2 months, or 2) ROV now to review titration study results.  If pt is agreeable to CPAP set up, then send order for CPAP 13 cm H2O with heated humidity and mask of choice.

## 2013-12-03 NOTE — Telephone Encounter (Signed)
lmtcb x1 

## 2013-12-03 NOTE — Telephone Encounter (Signed)
Pt calling a/b results of sleep study.Anthony Hunt

## 2013-12-03 NOTE — Telephone Encounter (Signed)
LMTCB

## 2013-12-05 ENCOUNTER — Telehealth: Payer: Self-pay | Admitting: Pulmonary Disease

## 2013-12-05 NOTE — Telephone Encounter (Signed)
Spoke with the pt  He is asking about CPAP status This was just ordered today  I advised that it will take a few days for them to get everything straightened out with ins and then he should receive a call  I gave him number to call [facility] next wk if he has not heard anything by mid wk Nothing further needed

## 2013-12-05 NOTE — Telephone Encounter (Signed)
Please return call [phone]

## 2013-12-05 NOTE — Telephone Encounter (Signed)
Spoke with the pt and notified of recs per VS  He verbalized understanding  Would like to go ahead and be started on CPAP  I have sent order to [place] with VS scheduled for 02/11/14

## 2013-12-22 ENCOUNTER — Other Ambulatory Visit: Payer: Self-pay | Admitting: Internal Medicine

## 2013-12-24 ENCOUNTER — Telehealth: Payer: Self-pay | Admitting: Pulmonary Disease

## 2013-12-24 DIAGNOSIS — G4733 Obstructive sleep apnea (adult) (pediatric): Secondary | ICD-10-CM

## 2013-12-24 NOTE — Telephone Encounter (Signed)
lmomtcb x1 for pt. I have sent order to DME.

## 2013-12-24 NOTE — Telephone Encounter (Signed)
Please have his DME change him to auto CPAP with pressure range of 5 to 20 cm H2O.  Send repeat CPAP download 2 weeks after this pressure change.

## 2013-12-24 NOTE — Telephone Encounter (Signed)
[phone] calling back

## 2013-12-24 NOTE — Telephone Encounter (Signed)
Spoke with the pt He states has received some emails from [facility] stating that he is having events despite using CPAP  He states he is averaging 20-30 events per night  [facility] also called and advised that he needs to make some adjustments to CPAP  Dr Halford Chessman, Mindy placed DL in your lookat  Please advise, thanks!

## 2013-12-24 NOTE — Telephone Encounter (Signed)
lmomtcb x1 for pt I have printed download off airview to place in VS look at

## 2013-12-25 ENCOUNTER — Encounter (HOSPITAL_BASED_OUTPATIENT_CLINIC_OR_DEPARTMENT_OTHER): Payer: BC Managed Care – PPO

## 2013-12-25 NOTE — Telephone Encounter (Signed)
Pt advised. Anthony Hunt, CMA  

## 2013-12-31 ENCOUNTER — Telehealth: Payer: Self-pay | Admitting: Pulmonary Disease

## 2013-12-31 DIAGNOSIS — G4733 Obstructive sleep apnea (adult) (pediatric): Secondary | ICD-10-CM

## 2013-12-31 NOTE — Telephone Encounter (Signed)
CPAP 11/24/13 to 12/23/13 >> used on 12 of 30 nights with average 7 hrs and 31 min.  Average AHI is 18 with median CPAP 13 cm H2O.  Will have my nurse inform pt that sleep apnea number is still high on current CPAP settings.  I have sent order to adjust settings >> change to auto CPAP range 10 to 20 cm H2O.

## 2013-12-31 NOTE — Telephone Encounter (Signed)
Spoke with pt, he has already had these changes made on his cpap and is tolerating them well.  Nothing further needed.

## 2014-01-14 ENCOUNTER — Telehealth: Payer: Self-pay | Admitting: Pulmonary Disease

## 2014-01-14 DIAGNOSIS — G4733 Obstructive sleep apnea (adult) (pediatric): Secondary | ICD-10-CM

## 2014-01-14 NOTE — Telephone Encounter (Signed)
Auto CPAP 12/25/13 to 01/06/14 >> Used on 13 of 13 nights with average 7 hrs 21 min.  Average AHI 9 with median CPAP 16 cm H2O and 95 th precentile CPAP 19 cm H2O.  Will have my nurse inform pt that CPAP report looks better after change in pressure setting

## 2014-01-20 NOTE — Telephone Encounter (Signed)
ATC x 2 Busy signal  wcb 

## 2014-01-24 NOTE — Telephone Encounter (Signed)
LMOM x 1 

## 2014-01-28 NOTE — Telephone Encounter (Signed)
LMOM x 2 

## 2014-01-30 NOTE — Telephone Encounter (Signed)
Please send order to have his DME assess his mask fit and arrange for chin strap.

## 2014-01-30 NOTE — Telephone Encounter (Addendum)
Results have been explained to patient, pt expressed understanding. Pt reports having a hard time getting mask to seal tight against face. Pt states that since pressure change, mask is okay at lower level but once reaching higher end pressure the mask begins to leak. Pt states that he is noticing that he is opening mouth during sleep and he feels this is contributing to his mask leaking.   Requests recs on options to correct this mask issue Pt does not have a chin strap. DME [facility]  Please advise Dr Halford Chessman. Thanks.

## 2014-01-30 NOTE — Addendum Note (Signed)
Addended by: Virl Cagey on: 01/30/2014 11:14 AM   Modules accepted: Orders

## 2014-01-30 NOTE — Telephone Encounter (Signed)
Order placed to assess mask fit and to arrange for chin strap Pt aware that order has been placed. Nothing further needed.

## 2014-02-11 ENCOUNTER — Encounter: Payer: Self-pay | Admitting: Pulmonary Disease

## 2014-02-11 ENCOUNTER — Ambulatory Visit (INDEPENDENT_AMBULATORY_CARE_PROVIDER_SITE_OTHER): Payer: BC Managed Care – PPO | Admitting: Pulmonary Disease

## 2014-02-11 VITALS — BP 140/78 | HR 45 | Temp 97.3°F | Ht 69.0 in | Wt 240.8 lb

## 2014-02-11 DIAGNOSIS — G4733 Obstructive sleep apnea (adult) (pediatric): Secondary | ICD-10-CM

## 2014-02-11 NOTE — Patient Instructions (Signed)
Follow up in 1 year.

## 2014-02-11 NOTE — Assessment & Plan Note (Signed)
He is compliant with therapy and reports benefit.  Will continue with auto CPAP with pressure range of 10 to 20 cm H2O.

## 2014-02-11 NOTE — Progress Notes (Signed)
Chief Complaint  Patient presents with  . Follow-up    Pt reports good tolerance of CPAP. Denies problems with mask or pressure.     History of Present Illness: Anthony Hunt is a 63 y.o. male with OSA.  He has been doing well with CPAP.  He has full face mask and chin strap >> no issues with mask fit.  He is sleeping better and feeling better during the day.  TESTS: HST 11/18/13 >> AHI 64.9, SaO2 low 67%.  Obstructive and central events.  260.2 min with SaO2 < 90%. Auto CPAP 12/25/13 to 01/06/14 >> Used on 13 of 13 nights with average 7 hrs 21 min. Average AHI 9 with median CPAP 16 cm H2O and 95 th precentile CPAP 19 cm H2O.  PMHx, PSHx, Medications, Allergies, Fhx, Shx reviewed.  Physical Exam:  General - No distress ENT - No sinus tenderness, no oral exudate, no LAN, triangular uvula, MP 3, decreased AP diameter Cardiac - s1s2 regular, no murmur Chest - No wheeze/rales/dullness Back - No focal tenderness Abd - Soft, non-tender Ext - No edema Neuro - Normal strength Skin - No rashes Psych - normal mood, and behavior   Assessment/Plan:  Anthony Mires, MD [place] [facility]/Critical Care/Sleep Pager:  [phone]

## 2014-03-25 ENCOUNTER — Telehealth: Payer: Self-pay | Admitting: Pulmonary Disease

## 2014-03-25 NOTE — Telephone Encounter (Signed)
Auto CPAP 02/13/14 to 8//28/15 >> used on 30 of 30 nights with average 7 hrs and 43 min.  Average AHI is 6.8 with median CPAP 15 cm H2O and 95 th percentile CPAP 18 cm H20.  Will have my nurse inform pt that CPAP report looks good.

## 2014-03-26 NOTE — Telephone Encounter (Signed)
Results have been explained to patient, pt expressed understanding. Nothing further needed.  

## 2014-05-02 ENCOUNTER — Other Ambulatory Visit: Payer: Self-pay

## 2014-08-19 ENCOUNTER — Ambulatory Visit (INDEPENDENT_AMBULATORY_CARE_PROVIDER_SITE_OTHER): Payer: BLUE CROSS/BLUE SHIELD | Admitting: Internal Medicine

## 2014-08-19 ENCOUNTER — Telehealth: Payer: Self-pay | Admitting: Internal Medicine

## 2014-08-19 ENCOUNTER — Encounter: Payer: Self-pay | Admitting: Internal Medicine

## 2014-08-19 VITALS — BP 116/72 | HR 60 | Temp 98.5°F | Ht 68.0 in | Wt 234.8 lb

## 2014-08-19 DIAGNOSIS — Z0000 Encounter for general adult medical examination without abnormal findings: Secondary | ICD-10-CM

## 2014-08-19 DIAGNOSIS — Z1211 Encounter for screening for malignant neoplasm of colon: Secondary | ICD-10-CM

## 2014-08-19 DIAGNOSIS — I2510 Atherosclerotic heart disease of native coronary artery without angina pectoris: Secondary | ICD-10-CM

## 2014-08-19 DIAGNOSIS — Z23 Encounter for immunization: Secondary | ICD-10-CM

## 2014-08-19 DIAGNOSIS — E785 Hyperlipidemia, unspecified: Secondary | ICD-10-CM

## 2014-08-19 LAB — LIPID PANEL
Cholesterol: 141 mg/dL (ref 0–200)
HDL: 38.7 mg/dL — ABNORMAL LOW (ref >39.00)
LDL CALC: 81 mg/dL (ref 0–99)
NONHDL: 102.3
TRIGLYCERIDES: 106 mg/dL (ref 0.0–149.0)
Total CHOL/HDL Ratio: 4
VLDL: 21.2 mg/dL (ref 0.0–40.0)

## 2014-08-19 LAB — CBC WITH DIFFERENTIAL/PLATELET
Basophils Absolute: 0 10*3/uL (ref 0.0–0.1)
Basophils Relative: 0.6 % (ref 0.0–3.0)
EOS PCT: 4.2 % (ref 0.0–5.0)
Eosinophils Absolute: 0.3 10*3/uL (ref 0.0–0.7)
HCT: 46.2 % (ref 39.0–52.0)
HEMOGLOBIN: 15.9 g/dL (ref 13.0–17.0)
Lymphocytes Relative: 25.7 % (ref 12.0–46.0)
Lymphs Abs: 1.5 10*3/uL (ref 0.7–4.0)
MCHC: 34.3 g/dL (ref 30.0–36.0)
MCV: 96.2 fl (ref 78.0–100.0)
MONOS PCT: 6.7 % (ref 3.0–12.0)
Monocytes Absolute: 0.4 10*3/uL (ref 0.1–1.0)
Neutro Abs: 3.7 10*3/uL (ref 1.4–7.7)
Neutrophils Relative %: 62.8 % (ref 43.0–77.0)
Platelets: 146 10*3/uL — ABNORMAL LOW (ref 150.0–400.0)
RBC: 4.8 Mil/uL (ref 4.22–5.81)
RDW: 13.3 % (ref 11.5–15.5)
WBC: 6 10*3/uL (ref 4.0–10.5)

## 2014-08-19 LAB — COMPREHENSIVE METABOLIC PANEL
ALBUMIN: 4.2 g/dL (ref 3.5–5.2)
ALK PHOS: 46 U/L (ref 39–117)
ALT: 21 U/L (ref 0–53)
AST: 19 U/L (ref 0–37)
BILIRUBIN TOTAL: 0.6 mg/dL (ref 0.2–1.2)
BUN: 18 mg/dL (ref 6–23)
CALCIUM: 9.3 mg/dL (ref 8.4–10.5)
CO2: 29 meq/L (ref 19–32)
CREATININE: 1.13 mg/dL (ref 0.40–1.50)
Chloride: 109 mEq/L (ref 96–112)
GFR: 70.01 mL/min (ref >60.00)
Glucose, Bld: 107 mg/dL — ABNORMAL HIGH (ref 70–99)
Potassium: 4.6 mEq/L (ref 3.5–5.1)
SODIUM: 142 meq/L (ref 135–145)
TOTAL PROTEIN: 6.5 g/dL (ref 6.0–8.3)

## 2014-08-19 LAB — T4, FREE: Free T4: 0.76 ng/dL (ref 0.60–1.60)

## 2014-08-19 MED ORDER — BENAZEPRIL HCL 10 MG PO TABS: ORAL_TABLET | ORAL | Status: DC

## 2014-08-19 MED ORDER — METOPROLOL TARTRATE 25 MG PO TABS: ORAL_TABLET | ORAL | Status: DC

## 2014-08-19 MED ORDER — SIMVASTATIN 80 MG PO TABS: ORAL_TABLET | ORAL | Status: DC

## 2014-08-19 NOTE — Assessment & Plan Note (Signed)
Quiet Discussed increased exercise On appropriate meds

## 2014-08-19 NOTE — Addendum Note (Signed)
Addended by: Emelia Salisbury C on: 08/19/2014 01:55 PM   Modules accepted: Orders

## 2014-08-19 NOTE — Patient Instructions (Signed)
DASH Eating Plan DASH stands for "Dietary Approaches to Stop Hypertension." The DASH eating plan is a healthy eating plan that has been shown to reduce high blood pressure (hypertension). Additional health benefits may include reducing the risk of type 2 diabetes mellitus, heart disease, and stroke. The DASH eating plan may also help with weight loss. WHAT DO I NEED TO KNOW ABOUT THE DASH EATING PLAN? For the DASH eating plan, you will follow these general guidelines:  Choose foods with a percent daily value for sodium of less than 5% (as listed on the food label).  Use salt-free seasonings or herbs instead of table salt or sea salt.  Check with your health care provider or pharmacist before using salt substitutes.  Eat lower-sodium products, often labeled as "lower sodium" or "no salt added."  Eat fresh foods.  Eat more vegetables, fruits, and low-fat dairy products.  Choose whole grains. Look for the word "whole" as the first word in the ingredient list.  Choose fish and skinless chicken or turkey more often than red meat. Limit fish, poultry, and meat to 6 oz (170 g) each day.  Limit sweets, desserts, sugars, and sugary drinks.  Choose heart-healthy fats.  Limit cheese to 1 oz (28 g) per day.  Eat more home-cooked food and less restaurant, buffet, and fast food.  Limit fried foods.  Cook foods using methods other than frying.  Limit canned vegetables. If you do use them, rinse them well to decrease the sodium.  When eating at a restaurant, ask that your food be prepared with less salt, or no salt if possible. WHAT FOODS CAN I EAT? Seek help from a dietitian for individual calorie needs. Grains Whole grain or whole wheat bread. Brown rice. Whole grain or whole wheat pasta. Quinoa, bulgur, and whole grain cereals. Low-sodium cereals. Corn or whole wheat flour tortillas. Whole grain cornbread. Whole grain crackers. Low-sodium crackers. Vegetables Fresh or frozen vegetables  (raw, steamed, roasted, or grilled). Low-sodium or reduced-sodium tomato and vegetable juices. Low-sodium or reduced-sodium tomato sauce and paste. Low-sodium or reduced-sodium canned vegetables.  Fruits All fresh, canned (in natural juice), or frozen fruits. Meat and Other Protein Products Ground beef (85% or leaner), grass-fed beef, or beef trimmed of fat. Skinless chicken or turkey. Ground chicken or turkey. Pork trimmed of fat. All fish and seafood. Eggs. Dried beans, peas, or lentils. Unsalted nuts and seeds. Unsalted canned beans. Dairy Low-fat dairy products, such as skim or 1% milk, 2% or reduced-fat cheeses, low-fat ricotta or cottage cheese, or plain low-fat yogurt. Low-sodium or reduced-sodium cheeses. Fats and Oils Tub margarines without trans fats. Light or reduced-fat mayonnaise and salad dressings (reduced sodium). Avocado. Safflower, olive, or canola oils. Natural peanut or almond butter. Other Unsalted popcorn and pretzels. The items listed above may not be a complete list of recommended foods or beverages. Contact your dietitian for more options. WHAT FOODS ARE NOT RECOMMENDED? Grains White bread. White pasta. White rice. Refined cornbread. Bagels and croissants. Crackers that contain trans fat. Vegetables Creamed or fried vegetables. Vegetables in a cheese sauce. Regular canned vegetables. Regular canned tomato sauce and paste. Regular tomato and vegetable juices. Fruits Dried fruits. Canned fruit in light or heavy syrup. Fruit juice. Meat and Other Protein Products Fatty cuts of meat. Ribs, chicken wings, bacon, sausage, bologna, salami, chitterlings, fatback, hot dogs, bratwurst, and packaged luncheon meats. Salted nuts and seeds. Canned beans with salt. Dairy Whole or 2% milk, cream, half-and-half, and cream cheese. Whole-fat or sweetened yogurt. Full-fat   cheeses or blue cheese. Nondairy creamers and whipped toppings. Processed cheese, cheese spreads, or cheese  curds. Condiments Onion and garlic salt, seasoned salt, table salt, and sea salt. Canned and packaged gravies. Worcestershire sauce. Tartar sauce. Barbecue sauce. Teriyaki sauce. Soy sauce, including reduced sodium. Steak sauce. Fish sauce. Oyster sauce. Cocktail sauce. Horseradish. Ketchup and mustard. Meat flavorings and tenderizers. Bouillon cubes. Hot sauce. Tabasco sauce. Marinades. Taco seasonings. Relishes. Fats and Oils Butter, stick margarine, lard, shortening, ghee, and bacon fat. Coconut, palm kernel, or palm oils. Regular salad dressings. Other Pickles and olives. Salted popcorn and pretzels. The items listed above may not be a complete list of foods and beverages to avoid. Contact your dietitian for more information. WHERE CAN I FIND MORE INFORMATION? National Heart, Lung, and Blood Institute: www.nhlbi.nih.gov/health/health-topics/topics/dash/ Document Released: 06/23/2011 Document Revised: 11/18/2013 Document Reviewed: 05/08/2013 ExitCare Patient Information 2015 Exit[employer]. This information is not intended to replace advice given to you by your health care provider. Make sure you discuss any questions you have with your health care provider. Exercise to Lose Weight Exercise and a healthy diet may help you lose weight. Your doctor may suggest specific exercises. EXERCISE IDEAS AND TIPS  Choose low-cost things you enjoy doing, such as walking, bicycling, or exercising to workout videos.  Take stairs instead of the elevator.  Walk during your lunch break.  Park your car further away from work or school.  Go to a gym or an exercise class.  Start with 5 to 10 minutes of exercise each day. Build up to 30 minutes of exercise 4 to 6 days a week.  Wear shoes with good support and comfortable clothes.  Stretch before and after working out.  Work out until you breathe harder and your heart beats faster.  Drink extra water when you exercise.  Do not do so much that you  hurt yourself, feel dizzy, or get very short of breath. Exercises that burn about 150 calories:  Running 1  miles in 15 minutes.  Playing volleyball for 45 to 60 minutes.  Washing and waxing a car for 45 to 60 minutes.  Playing touch football for 45 minutes.  Walking 1  miles in 35 minutes.  Pushing a stroller 1  miles in 30 minutes.  Playing basketball for 30 minutes.  Raking leaves for 30 minutes.  Bicycling 5 miles in 30 minutes.  Walking 2 miles in 30 minutes.  Dancing for 30 minutes.  Shoveling snow for 15 minutes.  Swimming laps for 20 minutes.  Walking up stairs for 15 minutes.  Bicycling 4 miles in 15 minutes.  Gardening for 30 to 45 minutes.  Jumping rope for 15 minutes.  Washing windows or floors for 45 to 60 minutes. Document Released: 08/06/2010 Document Revised: 09/26/2011 Document Reviewed: 08/06/2010 ExitCare Patient Information 2015 Exit[employer], [employer]. This information is not intended to replace advice given to you by your health care provider. Make sure you discuss any questions you have with your health care provider.  

## 2014-08-19 NOTE — Progress Notes (Signed)
Subjective:    Patient ID: Anthony Hunt, male    DOB: 07-17-1951, 64 y.o.   MRN: [id number]  HPI Here for physical Happy with visits with Dr Halford Chessman Using CPAP--- now daytime somnolence is better Weight down about 10#  No heart problems No other new concerns Really feels so much better  Current Outpatient Prescriptions on File Prior to Visit  Medication Sig Dispense Refill  . aspirin 81 MG tablet Take 81 mg by mouth daily.      . benazepril (LOTENSIN) 10 MG tablet TAKE 1 TABLET (10 MG TOTAL) BY MOUTH DAILY. 90 tablet 3  . metoprolol tartrate (LOPRESSOR) 25 MG tablet TAKE 1/2 TABLET BY MOUTH 2 TIMES A DAY 90 tablet 3  . simvastatin (ZOCOR) 80 MG tablet TAKE 1 TABLET (80 MG TOTAL) BY MOUTH AT BEDTIME. 90 tablet 3   No current facility-administered medications on file prior to visit.    No Known Allergies  Past Medical History  Diagnosis Date  . Allergy   . CAD (coronary artery disease) 2005    MI  . Hyperlipidemia     Past Surgical History  Procedure Laterality Date  . Cervical disc surgery    . US echocardiography       mild inf hypokinesis, EF 55%  09/05  . Ulnar nerve repair      Left ulnar nerve decompression 02/07  . Coronary stent placement  2005    Family History  Problem Relation Age of Onset  . Coronary artery disease Mother   . Peripheral vascular disease Mother   . Cancer Maternal Uncle   . Cancer Paternal Aunt   . Diabetes Neg Hx   . Hypertension Neg Hx     History   Social History  . Marital Status: Married    Spouse Name: N/A    Number of Children: 1  . Years of Education: N/A   Occupational History  . [employer]    Social History Main Topics  . Smoking status: Never Smoker   . Smokeless tobacco: Current User    Types: Chew  . Alcohol Use: No  . Drug Use: No  . Sexual Activity: Not on file   Other Topics Concern  . Not on file   Social History Narrative   Review of Systems  Constitutional: Negative for fatigue.    Wears seat belt  HENT: Negative for dental problem, hearing loss and tinnitus.        Regular with dentist  Eyes: Negative for visual disturbance.       No diplopia or unilateral vision loss  Respiratory: Negative for cough, chest tightness and shortness of breath.   Cardiovascular: Negative for chest pain, palpitations and leg swelling.  Gastrointestinal: Negative for nausea, vomiting, abdominal pain, constipation and blood in stool.       Occasional heartburn--takes rolaids ~ 1/month  Endocrine: Negative for polydipsia and polyuria.  Genitourinary: Negative for urgency.       No sexual problems  Musculoskeletal: Negative for back pain, joint swelling and arthralgias.  Skin: Negative for rash.       No suspicious lesions  Allergic/Immunologic: Negative for environmental allergies and immunocompromised state.  Neurological: Negative for dizziness, syncope, weakness, light-headedness, numbness and headaches.  Psychiatric/Behavioral: Negative for sleep disturbance and dysphoric mood. The patient is not nervous/anxious.        Objective:   Physical Exam  Constitutional: He is oriented to person, place, and time. He appears well-developed and well-nourished. No distress.  HENT:  Head: Normocephalic and atraumatic.  Right Ear: External ear normal.  Left Ear: External ear normal.  Mouth/Throat: Oropharynx is clear and moist. No oropharyngeal exudate.  Eyes: Conjunctivae and EOM are normal. Pupils are equal, round, and reactive to light.  Neck: Normal range of motion. Neck supple. No thyromegaly present.  Cardiovascular: Normal rate, regular rhythm, normal heart sounds and intact distal pulses.  Exam reveals no gallop.   No murmur heard. Pulmonary/Chest: Effort normal and breath sounds normal. No respiratory distress. He has no wheezes. He has no rales.  Abdominal: Soft.  Musculoskeletal: He exhibits no edema or tenderness.  Lymphadenopathy:    He has no cervical adenopathy.    Neurological: He is alert and oriented to person, place, and time.  Skin: No rash noted. No erythema.  Psychiatric: He has a normal mood and affect. His behavior is normal.          Assessment & Plan:

## 2014-08-19 NOTE — Progress Notes (Signed)
Pre visit review using our clinic review tool, if applicable. No additional management support is needed unless otherwise documented below in the visit note. 

## 2014-08-19 NOTE — Telephone Encounter (Signed)
emmi emailed °

## 2014-08-19 NOTE — Assessment & Plan Note (Signed)
No problems with statin 

## 2014-08-19 NOTE — Assessment & Plan Note (Signed)
Doing better since on CPAP Will defer PSA Fecal immunoassay Flu and Tdap today

## 2014-09-30 ENCOUNTER — Telehealth: Payer: Self-pay | Admitting: Pulmonary Disease

## 2014-09-30 DIAGNOSIS — G4733 Obstructive sleep apnea (adult) (pediatric): Secondary | ICD-10-CM

## 2014-09-30 NOTE — Telephone Encounter (Signed)
Called spoke with pt. He is requesting to get a new CPAP mask. He wants to chose what type pf mask he wants. Order placed. Nothing further needed

## 2014-10-22 ENCOUNTER — Telehealth: Payer: Self-pay

## 2014-10-22 NOTE — Telephone Encounter (Signed)
Pt request nasal spray for allergies Dr Silvio Pate has given him before; not on current or hx med list and pt will ck with pharmacy and if cannot find out name of nasal spray pt will cb.

## 2014-10-22 NOTE — Telephone Encounter (Addendum)
Pt left v/m; pt checked with [facility] and could not find nasal spray that had been prescribed for allergies. Pt request nasal spray for allergies sent to [facility].Please advise. Pt had annual exam om 08/19/14. Pt request cb.

## 2014-10-23 MED ORDER — FLUTICASONE PROPIONATE 50 MCG/ACT NA SUSP: 2.0000 | Freq: Every day | NASAL | Status: DC

## 2014-10-23 NOTE — Telephone Encounter (Signed)
.  left message to have patient return my call.  

## 2014-10-23 NOTE — Telephone Encounter (Signed)
Let him know Rx sent

## 2014-10-23 NOTE — Addendum Note (Signed)
Addended by: Viviana Simpler I on: 10/23/2014 10:45 AM   Modules accepted: Orders

## 2014-12-03 ENCOUNTER — Encounter: Payer: Self-pay | Admitting: Internal Medicine

## 2014-12-03 ENCOUNTER — Ambulatory Visit (INDEPENDENT_AMBULATORY_CARE_PROVIDER_SITE_OTHER): Payer: BLUE CROSS/BLUE SHIELD | Admitting: Internal Medicine

## 2014-12-03 VITALS — BP 130/80 | HR 60 | Temp 98.2°F | Wt 245.0 lb

## 2014-12-03 DIAGNOSIS — R51 Headache: Secondary | ICD-10-CM

## 2014-12-03 DIAGNOSIS — R519 Headache, unspecified: Secondary | ICD-10-CM | POA: Insufficient documentation

## 2014-12-03 MED ORDER — MONTELUKAST SODIUM 10 MG PO TABS: 10.0000 mg | ORAL_TABLET | Freq: Every day | ORAL | Status: DC

## 2014-12-03 NOTE — Progress Notes (Signed)
Pre visit review using our clinic review tool, if applicable. No additional management support is needed unless otherwise documented below in the visit note. 

## 2014-12-03 NOTE — Assessment & Plan Note (Signed)
Sounds like sinus Doesn't seem to have bacterial infection though early throat symptoms may be new viral infection Likely allergic Will add montelukast If more drainage, etc--- could try empiric antibiotic

## 2014-12-03 NOTE — Progress Notes (Signed)
   Subjective:    Patient ID: Anthony Hunt, male    DOB: [birth year], 64 y.o.   MRN: [id number]  HPI Here for sinus symptoms  Has sore and scratchy throat for 1 day Also having right occipital head pain if he bends over Coughing also Goes back 3 months or so  Will have some pain into shoulders with cough Has ongoing pressure in head--even without cough  No fever Slight sputum Some rhinorrhea and post nasal drip Cough is day and night No frontal pain No ear pain  No meds other than ibuprofen-- may help slightly but only happens with bending over and cough  Using flonase daily and 1 loratadine  Current Outpatient Prescriptions on File Prior to Visit  Medication Sig Dispense Refill  . aspirin 81 MG tablet Take 81 mg by mouth daily.      . benazepril (LOTENSIN) 10 MG tablet TAKE 1 TABLET (10 MG TOTAL) BY MOUTH DAILY. 90 tablet 3  . fluticasone (FLONASE) 50 MCG/ACT nasal spray Place 2 sprays into both nostrils daily. 16 g 11  . metoprolol tartrate (LOPRESSOR) 25 MG tablet TAKE 1/2 TABLET BY MOUTH 2 TIMES A DAY 90 tablet 3  . simvastatin (ZOCOR) 80 MG tablet TAKE 1 TABLET (80 MG TOTAL) BY MOUTH AT BEDTIME. 90 tablet 3   No current facility-administered medications on file prior to visit.    No Known Allergies  Past Medical History  Diagnosis Date  . Allergy   . CAD (coronary artery disease) 2005    MI  . Hyperlipidemia     Past Surgical History  Procedure Laterality Date  . Cervical disc surgery    . US echocardiography       mild inf hypokinesis, EF 55%  09/05  . Ulnar nerve repair      Left ulnar nerve decompression 02/07  . Coronary stent placement  2005    Family History  Problem Relation Age of Onset  . Coronary artery disease Mother   . Peripheral vascular disease Mother   . Cancer Maternal Uncle   . Cancer Paternal Aunt   . Diabetes Neg Hx   . Hypertension Neg Hx     History   Social History  . Marital Status: Married    Spouse Name: N/A  .  Number of Children: 1  . Years of Education: N/A   Occupational History  . [employer]    Social History Main Topics  . Smoking status: Never Smoker   . Smokeless tobacco: Current User    Types: Chew  . Alcohol Use: No  . Drug Use: No  . Sexual Activity: Not on file   Other Topics Concern  . Not on file   Social History Narrative   Review of Systems  No rash No vomiting or diarrhea Appetite is good     Objective:   Physical Exam  Constitutional: He appears well-developed and well-nourished. No distress.  HENT:  Mouth/Throat: Oropharynx is clear and moist. No oropharyngeal exudate.  No sinus tenderness  No head tenderness Moderate pale nasal congestion--worse on right TMs normal  Neck: Normal range of motion. Neck supple.  Pulmonary/Chest: Effort normal and breath sounds normal. No respiratory distress. He has no wheezes. He has no rales.  Lymphadenopathy:    He has no cervical adenopathy.          Assessment & Plan:

## 2014-12-03 NOTE — Patient Instructions (Signed)
Let me know if the cough and drainage are worsening--we might try an antibiotic then.

## 2015-03-03 ENCOUNTER — Encounter: Payer: Self-pay | Admitting: Pulmonary Disease

## 2015-03-03 ENCOUNTER — Ambulatory Visit (INDEPENDENT_AMBULATORY_CARE_PROVIDER_SITE_OTHER): Payer: BLUE CROSS/BLUE SHIELD | Admitting: Pulmonary Disease

## 2015-03-03 VITALS — BP 138/82 | HR 50 | Ht 69.0 in | Wt 235.4 lb

## 2015-03-03 DIAGNOSIS — G4733 Obstructive sleep apnea (adult) (pediatric): Secondary | ICD-10-CM

## 2015-03-03 DIAGNOSIS — E669 Obesity, unspecified: Secondary | ICD-10-CM

## 2015-03-03 NOTE — Patient Instructions (Signed)
Follow up in 1 year.

## 2015-03-03 NOTE — Progress Notes (Signed)
Chief Complaint  Patient presents with  . Follow-up    Wears CPAP nightly. Denies any problems with mask/pressure. Pt currently using nasal pillows - reports good tolerance.     History of Present Illness: Anthony Hunt is a 64 y.o. male with OSA.  Since last visit he got a nasal pillows mask.  This fits much better.  He sleeps well, and no issue with mask fit.  He feels rested during the day.  He has joined the [employer] and is planning to start an exercise regimen.  He has not really come up with a plan for diet modification.   TESTS: HST 11/18/13 >> AHI 64.9, SaO2 low 67%.  Obstructive and central events.  260.2 min with SaO2 < 90%. Auto CPAP 02/01/15 to 03/02/15 >> used on 30 of 30 nights with average 7 hrs and 36 min.  Average AHI is 1.9 with median CPAP 12 cm H2O and 95 th percentile CPAP 14 cm H20.   PMHx >> CAD s/p stent, HLD  PSHx, Medications, Allergies, Fhx, Shx reviewed.  Physical Exam: BP 138/82 mmHg  Pulse 50  Ht 5\' 9"  (1.753 m)  Wt 235 lb 6.4 oz (106.777 kg)  BMI 34.75 kg/m2  SpO2 93%  General - No distress ENT - No sinus tenderness, no oral exudate, no LAN, triangular uvula, MP 3, decreased AP diameter Cardiac - s1s2 regular, no murmur Chest - No wheeze/rales/dullness Back - No focal tenderness Abd - Soft, non-tender Ext - No edema Neuro - Normal strength Skin - No rashes Psych - normal mood, and behavior   Assessment/Plan:  Obstructive sleep apnea. He is compliant with CPAP and reports benefit from therapy. Plan: - continue auto CPAP range 10 to 20 cm H2O  Obesity. Plan: - discussed options to assist with weight loss   Chesley Mires, MD [facility] Pager:  [phone]

## 2015-08-25 ENCOUNTER — Encounter: Payer: Self-pay | Admitting: Internal Medicine

## 2015-08-25 ENCOUNTER — Ambulatory Visit (INDEPENDENT_AMBULATORY_CARE_PROVIDER_SITE_OTHER): Payer: BLUE CROSS/BLUE SHIELD | Admitting: Internal Medicine

## 2015-08-25 VITALS — BP 118/70 | HR 52 | Temp 97.9°F | Ht 68.25 in | Wt 225.5 lb

## 2015-08-25 DIAGNOSIS — G4733 Obstructive sleep apnea (adult) (pediatric): Secondary | ICD-10-CM | POA: Diagnosis not present

## 2015-08-25 DIAGNOSIS — I2510 Atherosclerotic heart disease of native coronary artery without angina pectoris: Secondary | ICD-10-CM

## 2015-08-25 DIAGNOSIS — Z23 Encounter for immunization: Secondary | ICD-10-CM | POA: Diagnosis not present

## 2015-08-25 DIAGNOSIS — Z125 Encounter for screening for malignant neoplasm of prostate: Secondary | ICD-10-CM | POA: Diagnosis not present

## 2015-08-25 DIAGNOSIS — Z1211 Encounter for screening for malignant neoplasm of colon: Secondary | ICD-10-CM

## 2015-08-25 DIAGNOSIS — Z0000 Encounter for general adult medical examination without abnormal findings: Secondary | ICD-10-CM

## 2015-08-25 LAB — LIPID PANEL
CHOL/HDL RATIO: 4
Cholesterol: 149 mg/dL (ref 0–200)
HDL: 41.4 mg/dL (ref >39.00)
LDL Cholesterol: 90 mg/dL (ref 0–99)
NONHDL: 107.69
Triglycerides: 86 mg/dL (ref 0.0–149.0)
VLDL: 17.2 mg/dL (ref 0.0–40.0)

## 2015-08-25 LAB — COMPREHENSIVE METABOLIC PANEL
ALT: 22 U/L (ref 0–53)
AST: 18 U/L (ref 0–37)
Albumin: 4.3 g/dL (ref 3.5–5.2)
Alkaline Phosphatase: 45 U/L (ref 39–117)
BUN: 15 mg/dL (ref 6–23)
CHLORIDE: 107 meq/L (ref 96–112)
CO2: 34 meq/L — AB (ref 19–32)
Calcium: 10 mg/dL (ref 8.4–10.5)
Creatinine, Ser: 1.1 mg/dL (ref 0.40–1.50)
GFR: 71.98 mL/min (ref >60.00)
Glucose, Bld: 108 mg/dL — ABNORMAL HIGH (ref 70–99)
Potassium: 5.2 mEq/L — ABNORMAL HIGH (ref 3.5–5.1)
Sodium: 144 mEq/L (ref 135–145)
Total Bilirubin: 0.7 mg/dL (ref 0.2–1.2)
Total Protein: 6.7 g/dL (ref 6.0–8.3)

## 2015-08-25 LAB — CBC WITH DIFFERENTIAL/PLATELET
BASOS PCT: 0.4 % (ref 0.0–3.0)
Basophils Absolute: 0 10*3/uL (ref 0.0–0.1)
Eosinophils Absolute: 0.4 10*3/uL (ref 0.0–0.7)
Eosinophils Relative: 5.6 % — ABNORMAL HIGH (ref 0.0–5.0)
HEMATOCRIT: 48.9 % (ref 39.0–52.0)
Hemoglobin: 16.5 g/dL (ref 13.0–17.0)
LYMPHS PCT: 23.9 % (ref 12.0–46.0)
Lymphs Abs: 1.5 10*3/uL (ref 0.7–4.0)
MCHC: 33.7 g/dL (ref 30.0–36.0)
MCV: 97.8 fl (ref 78.0–100.0)
MONOS PCT: 6.8 % (ref 3.0–12.0)
Monocytes Absolute: 0.4 10*3/uL (ref 0.1–1.0)
NEUTROS ABS: 4 10*3/uL (ref 1.4–7.7)
Neutrophils Relative %: 63.3 % (ref 43.0–77.0)
Platelets: 140 10*3/uL — ABNORMAL LOW (ref 150.0–400.0)
RBC: 5 Mil/uL (ref 4.22–5.81)
RDW: 13.6 % (ref 11.5–15.5)
WBC: 6.4 10*3/uL (ref 4.0–10.5)

## 2015-08-25 LAB — PSA: PSA: 2.33 ng/mL (ref 0.10–4.00)

## 2015-08-25 NOTE — Addendum Note (Signed)
Addended by: Marchia Bond on: 08/25/2015 04:09 PM   Modules accepted: Miquel Dunn

## 2015-08-25 NOTE — Addendum Note (Signed)
Addended by: Emelia Salisbury C on: 08/25/2015 10:19 AM   Modules accepted: Orders

## 2015-08-25 NOTE — Progress Notes (Signed)
Pre visit review using our clinic review tool, if applicable. No additional management support is needed unless otherwise documented below in the visit note. 

## 2015-08-25 NOTE — Assessment & Plan Note (Signed)
Healthy Will check PSA Fecal immunoassay---not sure what happened the last 2 years because he did do them Flu vaccine

## 2015-08-25 NOTE — Progress Notes (Signed)
Subjective:    Patient ID: Anthony Hunt, male    DOB: 07/27/1950, 65 y.o.   MRN: [id number]  HPI Here for physical  Doing well Sleeping well with CPAP--- happy with this Sees Dr Halford Chessman  [facility] Eating healthier now Weight down 10# now  Current Outpatient Prescriptions on File Prior to Visit  Medication Sig Dispense Refill  . aspirin 81 MG tablet Take 81 mg by mouth daily.      . benazepril (LOTENSIN) 10 MG tablet TAKE 1 TABLET (10 MG TOTAL) BY MOUTH DAILY. 90 tablet 3  . metoprolol tartrate (LOPRESSOR) 25 MG tablet TAKE 1/2 TABLET BY MOUTH 2 TIMES A DAY 90 tablet 3  . simvastatin (ZOCOR) 80 MG tablet TAKE 1 TABLET (80 MG TOTAL) BY MOUTH AT BEDTIME. 90 tablet 3   No current facility-administered medications on file prior to visit.    No Known Allergies  Past Medical History  Diagnosis Date  . Allergy   . CAD (coronary artery disease) 2005    MI  . Hyperlipidemia     Past Surgical History  Procedure Laterality Date  . Cervical disc surgery    . US echocardiography       mild inf hypokinesis, EF 55%  09/05  . Ulnar nerve repair      Left ulnar nerve decompression 02/07  . Coronary stent placement  2005    Family History  Problem Relation Age of Onset  . Coronary artery disease Mother   . Peripheral vascular disease Mother   . Cancer Maternal Uncle   . Cancer Paternal Aunt   . Diabetes Neg Hx   . Hypertension Neg Hx     Social History   Social History  . Marital Status: Married    Spouse Name: N/A  . Number of Children: 1  . Years of Education: N/A   Occupational History  . [employer]    Social History Main Topics  . Smoking status: Never Smoker   . Smokeless tobacco: Current User    Types: Chew  . Alcohol Use: No  . Drug Use: No  . Sexual Activity: Not on file   Other Topics Concern  . Not on file   Social History Narrative   Review of Systems  Constitutional: Negative for fatigue.       Wears  seat belt  HENT: Negative for dental problem, hearing loss and tinnitus.        Keeps up with dentist  Eyes: Negative for visual disturbance.       No diplopia or unilateral vision loss  Respiratory: Negative for cough, chest tightness and shortness of breath.   Cardiovascular: Negative for chest pain, palpitations and leg swelling.  Gastrointestinal: Negative for nausea, vomiting, abdominal pain, constipation and blood in stool.       No heartburn in over a year--rare  before  Endocrine: Negative for polydipsia and polyuria.  Genitourinary: Negative for urgency and difficulty urinating.       No sexual problems  Musculoskeletal: Negative for back pain, joint swelling and arthralgias.  Skin: Negative for pallor and rash.       No suspicious lesions  Allergic/Immunologic: Positive for environmental allergies. Negative for immunocompromised state.       Uses fluticasone spray and prn loratadine  Neurological: Positive for headaches. Negative for dizziness, syncope, weakness and light-headedness.  Hematological: Negative for adenopathy. Does not bruise/bleed easily.  Psychiatric/Behavioral: Negative for sleep disturbance and  dysphoric mood. The patient is not nervous/anxious.        AHI down from 65/hr to 1-2/hr       Objective:   Physical Exam  Constitutional: He is oriented to person, place, and time. He appears well-developed and well-nourished.  HENT:  Head: Normocephalic and atraumatic.  Right Ear: External ear normal.  Left Ear: External ear normal.  Mouth/Throat: Oropharynx is clear and moist. No oropharyngeal exudate.  Eyes: Conjunctivae are normal. Pupils are equal, round, and reactive to light.  Neck: Normal range of motion. Neck supple. No thyromegaly present.  Cardiovascular: Normal heart sounds and intact distal pulses.  Exam reveals no gallop.   No murmur heard. Slight bradycardia  Pulmonary/Chest: Effort normal and breath sounds normal. No respiratory distress. He has  no wheezes. He has no rales.  Abdominal: Soft. There is no tenderness.  Musculoskeletal: He exhibits no edema or tenderness.  Lymphadenopathy:    He has no cervical adenopathy.  Neurological: He is alert and oriented to person, place, and time.  Skin: No rash noted. No erythema.  Psychiatric: He has a normal mood and affect. His behavior is normal.          Assessment & Plan:

## 2015-08-25 NOTE — Assessment & Plan Note (Signed)
Doing great with the CPAP

## 2015-08-25 NOTE — Assessment & Plan Note (Signed)
No symptoms On appropriate secondary prevention 

## 2015-09-04 ENCOUNTER — Encounter: Payer: Self-pay | Admitting: Internal Medicine

## 2015-09-04 ENCOUNTER — Ambulatory Visit (INDEPENDENT_AMBULATORY_CARE_PROVIDER_SITE_OTHER): Payer: BLUE CROSS/BLUE SHIELD | Admitting: Internal Medicine

## 2015-09-04 VITALS — BP 140/80 | HR 54 | Temp 97.7°F | Wt 227.0 lb

## 2015-09-04 DIAGNOSIS — J069 Acute upper respiratory infection, unspecified: Secondary | ICD-10-CM | POA: Diagnosis not present

## 2015-09-04 NOTE — Progress Notes (Signed)
   Subjective:    Patient ID: Anthony Hunt, male    DOB: 01/08/1951, 65 y.o.   MRN: [id number]  HPI Here due to respiratory symptoms  Started 4-5 days ago Hasn't gotten better Some headache---pressure sensation bilateral vertex of head Coughing spells which worsen the headache Deep cough--productive of clear sputum No fever No chills or sweats  No SOB No ear pain  Using nyquil at night-- sleeping okay Ibuprofen for headache-- ?some help  Current Outpatient Prescriptions on File Prior to Visit  Medication Sig Dispense Refill  . aspirin 81 MG tablet Take 81 mg by mouth daily.      . benazepril (LOTENSIN) 10 MG tablet TAKE 1 TABLET (10 MG TOTAL) BY MOUTH DAILY. 90 tablet 3  . fluticasone (FLONASE) 50 MCG/ACT nasal spray Place 2 sprays into both nostrils daily.    . metoprolol tartrate (LOPRESSOR) 25 MG tablet TAKE 1/2 TABLET BY MOUTH 2 TIMES A DAY 90 tablet 3  . simvastatin (ZOCOR) 80 MG tablet TAKE 1 TABLET (80 MG TOTAL) BY MOUTH AT BEDTIME. 90 tablet 3   No current facility-administered medications on file prior to visit.    No Known Allergies  Past Medical History  Diagnosis Date  . Allergy   . CAD (coronary artery disease) 2005    MI  . Hyperlipidemia     Past Surgical History  Procedure Laterality Date  . Cervical disc surgery    . US echocardiography       mild inf hypokinesis, EF 55%  09/05  . Ulnar nerve repair      Left ulnar nerve decompression 02/07  . Coronary stent placement  2005    Family History  Problem Relation Age of Onset  . Coronary artery disease Mother   . Peripheral vascular disease Mother   . Cancer Maternal Uncle   . Cancer Paternal Aunt   . Diabetes Neg Hx   . Hypertension Neg Hx     Social History   Social History  . Marital Status: Married    Spouse Name: N/A  . Number of Children: 1  . Years of Education: N/A   Occupational History  . [employer]    Social History Main Topics  . Smoking status: Never Smoker     . Smokeless tobacco: Current User    Types: Chew  . Alcohol Use: No  . Drug Use: No  . Sexual Activity: Not on file   Other Topics Concern  . Not on file   Social History Narrative   Review of Systems  No rash No vomiting or diarrhea Appetite is okay     Objective:   Physical Exam  Constitutional: He appears well-developed and well-nourished. No distress.  HENT:  No sinus tenderness TMs normal Moderate nasal inflammation No pharyngeal injection or exudates  Neck: Normal range of motion. Neck supple.  Pulmonary/Chest: Effort normal and breath sounds normal. No respiratory distress. He has no wheezes. He has no rales.  Lymphadenopathy:    He has no cervical adenopathy.  Skin: No rash noted.          Assessment & Plan:

## 2015-09-04 NOTE — Assessment & Plan Note (Signed)
Discussed the viral etiology Supportive Rx If worsens next week, would send Rx for amoxil

## 2015-09-13 ENCOUNTER — Other Ambulatory Visit: Payer: Self-pay | Admitting: Internal Medicine

## 2015-09-21 ENCOUNTER — Other Ambulatory Visit: Payer: Self-pay | Admitting: Internal Medicine

## 2015-09-21 NOTE — Telephone Encounter (Signed)
Rx sent electronically.  

## 2015-12-08 ENCOUNTER — Telehealth: Payer: Self-pay | Admitting: Pulmonary Disease

## 2015-12-08 NOTE — Telephone Encounter (Signed)
CMN from [facility] is in VS's sign stack. It should be signed today when he comes in. LMTCB to advise.

## 2015-12-09 NOTE — Telephone Encounter (Signed)
Pt returning call and can be reached @ [phone].Anthony Hunt

## 2015-12-09 NOTE — Telephone Encounter (Signed)
Ashtyn please advise if VS signed this yesterday. Thanks.

## 2015-12-09 NOTE — Telephone Encounter (Signed)
Order was received and is being faxed right now by [facility] Rodena Piety)  Pt aware, Nothing further needed.

## 2016-03-09 ENCOUNTER — Encounter: Payer: Self-pay | Admitting: Pulmonary Disease

## 2016-03-09 ENCOUNTER — Ambulatory Visit (INDEPENDENT_AMBULATORY_CARE_PROVIDER_SITE_OTHER): Payer: Managed Care, Other (non HMO) | Admitting: Pulmonary Disease

## 2016-03-09 VITALS — BP 138/78 | HR 48 | Ht 69.0 in | Wt 241.6 lb

## 2016-03-09 DIAGNOSIS — Z9989 Dependence on other enabling machines and devices: Principal | ICD-10-CM

## 2016-03-09 DIAGNOSIS — G4733 Obstructive sleep apnea (adult) (pediatric): Secondary | ICD-10-CM

## 2016-03-09 NOTE — Progress Notes (Signed)
Current Outpatient Prescriptions on File Prior to Visit  Medication Sig  . aspirin 81 MG tablet Take 81 mg by mouth daily.    . benazepril (LOTENSIN) 10 MG tablet TAKE 1 TABLET (10 MG TOTAL) BY MOUTH DAILY.  . fluticasone (FLONASE) 50 MCG/ACT nasal spray Place 2 sprays into both nostrils daily.  . metoprolol tartrate (LOPRESSOR) 25 MG tablet TAKE 1/2 TABLET BY MOUTH 2 TIMES A DAY  . simvastatin (ZOCOR) 80 MG tablet TAKE 1 TABLET (80 MG TOTAL) BY MOUTH AT BEDTIME.   No current facility-administered medications on file prior to visit.     Chief Complaint  Patient presents with  . Follow-up    Wears CPAP nightly. Denies problems with mask/pressure. DME: [facility]    Sleep tests: HST 11/18/13 >> AHI 64.9, SaO2 low 67%.  Obstructive and central events.  260.2 min with SaO2 < 90%. Auto CPAP 02/07/16 to 03/07/16 >> used on 30 of 30 nights with average 7 hrs 48 min.  Average AHI 1.9 with median CPAP 11 and 95 th percentile CPAP 14 cm H2O  Past medical history CAD s/p stent, HLD  Past surgical history, Family history, Social history, Allergies reviewed.  Vital signs BP 138/78 (BP Location: Left Arm, Cuff Size: Normal)   Pulse (!) 48   Ht 5\' 9"  (1.753 m)   Wt 241 lb 9.6 oz (109.6 kg)   SpO2 97%   BMI 35.68 kg/m    History of Present Illness: Anthony Hunt is a 65 y.o. male with OSA.  He is using nasal pillows.  No issues with mask fit, sinus congestion, or dry mouth.  Gets about 7.5 to 8 hrs sleep per night.  Feels rested >> no longer needing naps.  Physical Exam:  General - No distress ENT - No sinus tenderness, no oral exudate, no LAN, triangular uvula, MP 3, decreased AP diameter Cardiac - s1s2 regular, no murmur Chest - No wheeze/rales/dullness Back - No focal tenderness Abd - Soft, non-tender Ext - No edema Neuro - Normal strength Skin - No rashes Psych - normal mood, and behavior   Assessment/Plan:  Obstructive sleep apnea. - He is compliant with CPAP and reports  benefit from therapy. - continue auto CPAP range 10 to 20 cm H2O  Obesity. - discussed options to assist with weight loss   Patient Instructions  Follow up in 1 year    Chesley Mires, MD [facility]/Sleep Pager:  [phone] 03/09/2016, 9:19 AM

## 2016-03-09 NOTE — Patient Instructions (Signed)
Follow up in 1 year.

## 2016-06-10 ENCOUNTER — Other Ambulatory Visit: Payer: Self-pay | Admitting: Internal Medicine

## 2016-08-30 ENCOUNTER — Ambulatory Visit (INDEPENDENT_AMBULATORY_CARE_PROVIDER_SITE_OTHER): Payer: Managed Care, Other (non HMO) | Admitting: Internal Medicine

## 2016-08-30 ENCOUNTER — Encounter: Payer: Self-pay | Admitting: Internal Medicine

## 2016-08-30 VITALS — BP 118/76 | HR 48 | Temp 98.0°F | Ht 68.0 in | Wt 233.0 lb

## 2016-08-30 DIAGNOSIS — I2510 Atherosclerotic heart disease of native coronary artery without angina pectoris: Secondary | ICD-10-CM | POA: Diagnosis not present

## 2016-08-30 DIAGNOSIS — G4733 Obstructive sleep apnea (adult) (pediatric): Secondary | ICD-10-CM

## 2016-08-30 DIAGNOSIS — Z23 Encounter for immunization: Secondary | ICD-10-CM

## 2016-08-30 DIAGNOSIS — Z0000 Encounter for general adult medical examination without abnormal findings: Secondary | ICD-10-CM | POA: Diagnosis not present

## 2016-08-30 DIAGNOSIS — Z1211 Encounter for screening for malignant neoplasm of colon: Secondary | ICD-10-CM | POA: Diagnosis not present

## 2016-08-30 LAB — CBC WITH DIFFERENTIAL/PLATELET
BASOS ABS: 0 10*3/uL (ref 0.0–0.1)
BASOS PCT: 0.6 % (ref 0.0–3.0)
EOS ABS: 0.3 10*3/uL (ref 0.0–0.7)
Eosinophils Relative: 4.4 % (ref 0.0–5.0)
HCT: 47.2 % (ref 39.0–52.0)
Hemoglobin: 16.2 g/dL (ref 13.0–17.0)
LYMPHS ABS: 1.3 10*3/uL (ref 0.7–4.0)
Lymphocytes Relative: 21.3 % (ref 12.0–46.0)
MCHC: 34.4 g/dL (ref 30.0–36.0)
MCV: 97.6 fl (ref 78.0–100.0)
Monocytes Absolute: 0.5 10*3/uL (ref 0.1–1.0)
Monocytes Relative: 7.6 % (ref 3.0–12.0)
NEUTROS ABS: 4 10*3/uL (ref 1.4–7.7)
NEUTROS PCT: 66.1 % (ref 43.0–77.0)
PLATELETS: 133 10*3/uL — AB (ref 150.0–400.0)
RBC: 4.84 Mil/uL (ref 4.22–5.81)
RDW: 13.5 % (ref 11.5–15.5)
WBC: 6.1 10*3/uL (ref 4.0–10.5)

## 2016-08-30 LAB — COMPREHENSIVE METABOLIC PANEL
ALT: 28 U/L (ref 0–53)
AST: 26 U/L (ref 0–37)
Albumin: 4 g/dL (ref 3.5–5.2)
Alkaline Phosphatase: 39 U/L (ref 39–117)
BILIRUBIN TOTAL: 0.6 mg/dL (ref 0.2–1.2)
BUN: 19 mg/dL (ref 6–23)
CHLORIDE: 109 meq/L (ref 96–112)
CO2: 29 meq/L (ref 19–32)
CREATININE: 1.08 mg/dL (ref 0.40–1.50)
Calcium: 9.3 mg/dL (ref 8.4–10.5)
GFR: 73.28 mL/min (ref >60.00)
GLUCOSE: 118 mg/dL — AB (ref 70–99)
Potassium: 4.5 mEq/L (ref 3.5–5.1)
SODIUM: 142 meq/L (ref 135–145)
Total Protein: 6.4 g/dL (ref 6.0–8.3)

## 2016-08-30 LAB — LIPID PANEL
CHOL/HDL RATIO: 3
Cholesterol: 126 mg/dL (ref 0–200)
HDL: 38.7 mg/dL — AB (ref >39.00)
LDL Cholesterol: 70 mg/dL (ref 0–99)
NONHDL: 87.28
TRIGLYCERIDES: 85 mg/dL (ref 0.0–149.0)
VLDL: 17 mg/dL (ref 0.0–40.0)

## 2016-08-30 MED ORDER — BENAZEPRIL HCL 10 MG PO TABS: ORAL_TABLET | ORAL | 3 refills | Status: DC

## 2016-08-30 MED ORDER — METOPROLOL TARTRATE 25 MG PO TABS: ORAL_TABLET | ORAL | 3 refills | Status: DC

## 2016-08-30 MED ORDER — SIMVASTATIN 80 MG PO TABS: ORAL_TABLET | ORAL | 3 refills | Status: DC

## 2016-08-30 NOTE — Assessment & Plan Note (Signed)
Healthy Defer PSA He continues to do FIT--not sure why we don't get them Discussed fitness--has lost another 8-9#

## 2016-08-30 NOTE — Addendum Note (Signed)
Addended by: Pilar Grammes on: 08/30/2016 12:56 PM   Modules accepted: Orders

## 2016-08-30 NOTE — Progress Notes (Signed)
Pre visit review using our clinic review tool, if applicable. No additional management support is needed unless otherwise documented below in the visit note. 

## 2016-08-30 NOTE — Assessment & Plan Note (Signed)
Doing great on the CPAP

## 2016-08-30 NOTE — Assessment & Plan Note (Signed)
No symptoms On appropriate regimen

## 2016-08-30 NOTE — Patient Instructions (Signed)
DASH Eating Plan DASH stands for "Dietary Approaches to Stop Hypertension." The DASH eating plan is a healthy eating plan that has been shown to reduce high blood pressure (hypertension). Additional health benefits may include reducing the risk of type 2 diabetes mellitus, heart disease, and stroke. The DASH eating plan may also help with weight loss. What do I need to know about the DASH eating plan? For the DASH eating plan, you will follow these general guidelines:  Choose foods with less than 150 milligrams of sodium per serving (as listed on the food label).  Use salt-free seasonings or herbs instead of table salt or sea salt.  Check with your health care provider or pharmacist before using salt substitutes.  Eat lower-sodium products. These are often labeled as "low-sodium" or "no salt added."  Eat fresh foods. Avoid eating a lot of canned foods.  Eat more vegetables, fruits, and low-fat dairy products.  Choose whole grains. Look for the word "whole" as the first word in the ingredient list.  Choose fish and skinless chicken or turkey more often than red meat. Limit fish, poultry, and meat to 6 oz (170 g) each day.  Limit sweets, desserts, sugars, and sugary drinks.  Choose heart-healthy fats.  Eat more home-cooked food and less restaurant, buffet, and fast food.  Limit fried foods.  Do not fry foods. Cook foods using methods such as baking, boiling, grilling, and broiling instead.  When eating at a restaurant, ask that your food be prepared with less salt, or no salt if possible. What foods can I eat? Seek help from a dietitian for individual calorie needs. Grains  Whole grain or whole wheat bread. Brown rice. Whole grain or whole wheat pasta. Quinoa, bulgur, and whole grain cereals. Low-sodium cereals. Corn or whole wheat flour tortillas. Whole grain cornbread. Whole grain crackers. Low-sodium crackers. Vegetables  Fresh or frozen vegetables (raw, steamed, roasted, or  grilled). Low-sodium or reduced-sodium tomato and vegetable juices. Low-sodium or reduced-sodium tomato sauce and paste. Low-sodium or reduced-sodium canned vegetables. Fruits  All fresh, canned (in natural juice), or frozen fruits. Meat and Other Protein Products  Ground beef (85% or leaner), grass-fed beef, or beef trimmed of fat. Skinless chicken or turkey. Ground chicken or turkey. Pork trimmed of fat. All fish and seafood. Eggs. Dried beans, peas, or lentils. Unsalted nuts and seeds. Unsalted canned beans. Dairy  Low-fat dairy products, such as skim or 1% milk, 2% or reduced-fat cheeses, low-fat ricotta or cottage cheese, or plain low-fat yogurt. Low-sodium or reduced-sodium cheeses. Fats and Oils  Tub margarines without trans fats. Light or reduced-fat mayonnaise and salad dressings (reduced sodium). Avocado. Safflower, olive, or canola oils. Natural peanut or almond butter. Other  Unsalted popcorn and pretzels. The items listed above may not be a complete list of recommended foods or beverages. Contact your dietitian for more options.  What foods are not recommended? Grains  White bread. White pasta. White rice. Refined cornbread. Bagels and croissants. Crackers that contain trans fat. Vegetables  Creamed or fried vegetables. Vegetables in a cheese sauce. Regular canned vegetables. Regular canned tomato sauce and paste. Regular tomato and vegetable juices. Fruits  Canned fruit in light or heavy syrup. Fruit juice. Meat and Other Protein Products  Fatty cuts of meat. Ribs, chicken wings, bacon, sausage, bologna, salami, chitterlings, fatback, hot dogs, bratwurst, and packaged luncheon meats. Salted nuts and seeds. Canned beans with salt. Dairy  Whole or 2% milk, cream, half-and-half, and cream cheese. Whole-fat or sweetened yogurt. Full-fat cheeses   or blue cheese. Nondairy creamers and whipped toppings. Processed cheese, cheese spreads, or cheese curds. Condiments  Onion and garlic  salt, seasoned salt, table salt, and sea salt. Canned and packaged gravies. Worcestershire sauce. Tartar sauce. Barbecue sauce. Teriyaki sauce. Soy sauce, including reduced sodium. Steak sauce. Fish sauce. Oyster sauce. Cocktail sauce. Horseradish. Ketchup and mustard. Meat flavorings and tenderizers. Bouillon cubes. Hot sauce. Tabasco sauce. Marinades. Taco seasonings. Relishes. Fats and Oils  Butter, stick margarine, lard, shortening, ghee, and bacon fat. Coconut, palm kernel, or palm oils. Regular salad dressings. Other  Pickles and olives. Salted popcorn and pretzels. The items listed above may not be a complete list of foods and beverages to avoid. Contact your dietitian for more information.  Where can I find more information? National Heart, Lung, and Blood Institute: www.nhlbi.nih.gov/health/health-topics/topics/dash/ This information is not intended to replace advice given to you by your health care provider. Make sure you discuss any questions you have with your health care provider. Document Released: 06/23/2011 Document Revised: 12/10/2015 Document Reviewed: 05/08/2013 Elsevier Interactive Patient Education  2017 [employer].  

## 2016-08-30 NOTE — Progress Notes (Signed)
Subjective:    Patient ID: Anthony Hunt, male    DOB: 09/25/1950, 66 y.o.   MRN: [id number]  HPI Here for physical  He has no new concerns No heart troubles--doesn't cardiologist Some exercise--- lots of work on farms, doing more installing work, walks  Still uses CPAP Auto-titrate set for 10-20 Uses all night every day and finds it very helpful AHI down from 65 to 1-2  Current Outpatient Prescriptions on File Prior to Visit  Medication Sig Dispense Refill  . aspirin 81 MG tablet Take 81 mg by mouth daily.      . benazepril (LOTENSIN) 10 MG tablet TAKE 1 TABLET (10 MG TOTAL) BY MOUTH DAILY. 90 tablet 3  . fluticasone (FLONASE) 50 MCG/ACT nasal spray Place 2 sprays into both nostrils daily.    . metoprolol tartrate (LOPRESSOR) 25 MG tablet TAKE 1/2 TABLET BY MOUTH 2 TIMES A DAY 90 tablet 0  . simvastatin (ZOCOR) 80 MG tablet TAKE 1 TABLET (80 MG TOTAL) BY MOUTH AT BEDTIME. 90 tablet 3   No current facility-administered medications on file prior to visit.     No Known Allergies  Past Medical History:  Diagnosis Date  . Allergy   . CAD (coronary artery disease) 2005   MI  . Hyperlipidemia     Past Surgical History:  Procedure Laterality Date  . CERVICAL DISC SURGERY    . CORONARY STENT PLACEMENT  2005  . ULNAR NERVE REPAIR     Left ulnar nerve decompression 02/07  . US ECHOCARDIOGRAPHY      mild inf hypokinesis, EF 55%  09/05    Family History  Problem Relation Age of Onset  . Coronary artery disease Mother   . Peripheral vascular disease Mother   . Cancer Maternal Uncle   . Cancer Paternal Aunt   . Diabetes Neg Hx   . Hypertension Neg Hx     Social History   Social History  . Marital status: Married    Spouse name: N/A  . Number of children: 1  . Years of education: N/A   Occupational History  . [employer]    Social History Main Topics  . Smoking status: Never Smoker  . Smokeless tobacco: Current User    Types: Chew  . Alcohol use No    . Drug use: No  . Sexual activity: Not on file   Other Topics Concern  . Not on file   Social History Narrative  . No narrative on file   Review of Systems  Constitutional: Negative for fatigue.       Wears seat belt  HENT: Negative for dental problem, hearing loss and tinnitus.        Keeps up with dentist  Eyes: Negative for visual disturbance.       No diplopia or unilateral vision loss  Respiratory: Negative for cough, chest tightness and shortness of breath.   Cardiovascular: Negative for chest pain, palpitations and leg swelling.  Gastrointestinal: Negative for blood in stool, constipation, nausea and vomiting.       No heartburn  Endocrine: Negative for polydipsia and polyuria.  Genitourinary: Negative for difficulty urinating, frequency and urgency.       No sexual problems  Musculoskeletal: Negative for arthralgias, back pain and joint swelling.  Skin: Negative for rash.       No suspicious lesions  Allergic/Immunologic: Positive for environmental allergies. Negative for immunocompromised state.       Uses OTC in spring only  Neurological:  Negative for dizziness, syncope, light-headedness and headaches.  Hematological: Negative for adenopathy. Does not bruise/bleed easily.  Psychiatric/Behavioral: Negative for dysphoric mood and sleep disturbance. The patient is not nervous/anxious.        Objective:   Physical Exam  Constitutional: He is oriented to person, place, and time. He appears well-developed and well-nourished. No distress.  HENT:  Head: Normocephalic and atraumatic.  Right Ear: External ear normal.  Left Ear: External ear normal.  Mouth/Throat: Oropharynx is clear and moist. No oropharyngeal exudate.  Eyes: Conjunctivae are normal. Pupils are equal, round, and reactive to light.  Neck: Normal range of motion. Neck supple. No thyromegaly present.  Cardiovascular: Normal rate, regular rhythm, normal heart sounds and intact distal pulses.  Exam reveals no  gallop.   No murmur heard. Pulmonary/Chest: Effort normal and breath sounds normal. No respiratory distress. He has no wheezes. He has no rales.  Abdominal: Soft. There is no tenderness.  Musculoskeletal: He exhibits no edema or tenderness.  Lymphadenopathy:    He has no cervical adenopathy.  Neurological: He is alert and oriented to person, place, and time.  Skin: No rash noted. No erythema.  Psychiatric: He has a normal mood and affect. His behavior is normal.          Assessment & Plan:

## 2016-09-07 ENCOUNTER — Other Ambulatory Visit: Payer: Self-pay | Admitting: Internal Medicine

## 2017-01-25 DIAGNOSIS — H5213 Myopia, bilateral: Secondary | ICD-10-CM | POA: Diagnosis not present

## 2017-08-10 DIAGNOSIS — G4733 Obstructive sleep apnea (adult) (pediatric): Secondary | ICD-10-CM | POA: Diagnosis not present

## 2017-08-23 ENCOUNTER — Other Ambulatory Visit: Payer: Self-pay | Admitting: Internal Medicine

## 2017-09-01 ENCOUNTER — Encounter: Payer: Self-pay | Admitting: Internal Medicine

## 2017-09-01 ENCOUNTER — Ambulatory Visit (INDEPENDENT_AMBULATORY_CARE_PROVIDER_SITE_OTHER): Payer: BLUE CROSS/BLUE SHIELD | Admitting: Internal Medicine

## 2017-09-01 VITALS — BP 136/82 | HR 53 | Temp 98.0°F | Ht 68.0 in | Wt 235.0 lb

## 2017-09-01 DIAGNOSIS — Z125 Encounter for screening for malignant neoplasm of prostate: Secondary | ICD-10-CM | POA: Diagnosis not present

## 2017-09-01 DIAGNOSIS — Z0000 Encounter for general adult medical examination without abnormal findings: Secondary | ICD-10-CM

## 2017-09-01 DIAGNOSIS — R7301 Impaired fasting glucose: Secondary | ICD-10-CM | POA: Diagnosis not present

## 2017-09-01 DIAGNOSIS — I2510 Atherosclerotic heart disease of native coronary artery without angina pectoris: Secondary | ICD-10-CM

## 2017-09-01 DIAGNOSIS — Z1211 Encounter for screening for malignant neoplasm of colon: Secondary | ICD-10-CM | POA: Diagnosis not present

## 2017-09-01 DIAGNOSIS — G4733 Obstructive sleep apnea (adult) (pediatric): Secondary | ICD-10-CM

## 2017-09-01 DIAGNOSIS — Z23 Encounter for immunization: Secondary | ICD-10-CM | POA: Diagnosis not present

## 2017-09-01 DIAGNOSIS — R7303 Prediabetes: Secondary | ICD-10-CM | POA: Insufficient documentation

## 2017-09-01 LAB — LIPID PANEL
CHOL/HDL RATIO: 4
Cholesterol: 124 mg/dL (ref 0–200)
HDL: 29.1 mg/dL — ABNORMAL LOW (ref >39.00)
LDL CALC: 74 mg/dL (ref 0–99)
NonHDL: 95.16
TRIGLYCERIDES: 106 mg/dL (ref 0.0–149.0)
VLDL: 21.2 mg/dL (ref 0.0–40.0)

## 2017-09-01 LAB — CBC
HEMATOCRIT: 46.5 % (ref 39.0–52.0)
HEMOGLOBIN: 16.2 g/dL (ref 13.0–17.0)
MCHC: 34.9 g/dL (ref 30.0–36.0)
MCV: 97.5 fl (ref 78.0–100.0)
PLATELETS: 127 10*3/uL — AB (ref 150.0–400.0)
RBC: 4.77 Mil/uL (ref 4.22–5.81)
RDW: 13.2 % (ref 11.5–15.5)
WBC: 5.3 10*3/uL (ref 4.0–10.5)

## 2017-09-01 LAB — COMPREHENSIVE METABOLIC PANEL
ALT: 29 U/L (ref 0–53)
AST: 23 U/L (ref 0–37)
Albumin: 4.1 g/dL (ref 3.5–5.2)
Alkaline Phosphatase: 46 U/L (ref 39–117)
BILIRUBIN TOTAL: 0.7 mg/dL (ref 0.2–1.2)
BUN: 23 mg/dL (ref 6–23)
CALCIUM: 9.2 mg/dL (ref 8.4–10.5)
CHLORIDE: 106 meq/L (ref 96–112)
CO2: 28 meq/L (ref 19–32)
Creatinine, Ser: 1.15 mg/dL (ref 0.40–1.50)
GFR: 67.94 mL/min (ref >60.00)
Glucose, Bld: 153 mg/dL — ABNORMAL HIGH (ref 70–99)
Potassium: 4.8 mEq/L (ref 3.5–5.1)
Sodium: 143 mEq/L (ref 135–145)
Total Protein: 6.4 g/dL (ref 6.0–8.3)

## 2017-09-01 LAB — PSA: PSA: 2.87 ng/mL (ref 0.10–4.00)

## 2017-09-01 LAB — HEMOGLOBIN A1C: HEMOGLOBIN A1C: 6.9 % — AB (ref 4.6–6.5)

## 2017-09-01 MED ORDER — METOPROLOL SUCCINATE ER 25 MG PO TB24: 25.0000 mg | ORAL_TABLET | Freq: Every day | ORAL | 3 refills | Status: DC

## 2017-09-01 NOTE — Patient Instructions (Signed)
DASH Eating Plan DASH stands for "Dietary Approaches to Stop Hypertension." The DASH eating plan is a healthy eating plan that has been shown to reduce high blood pressure (hypertension). It may also reduce your risk for type 2 diabetes, heart disease, and stroke. The DASH eating plan may also help with weight loss. What are tips for following this plan? General guidelines  Avoid eating more than 2,300 mg (milligrams) of salt (sodium) a day. If you have hypertension, you may need to reduce your sodium intake to 1,500 mg a day.  Limit alcohol intake to no more than 1 drink a day for nonpregnant women and 2 drinks a day for men. One drink equals 12 oz of beer, 5 oz of wine, or 1 oz of hard liquor.  Work with your health care provider to maintain a healthy body weight or to lose weight. Ask what an ideal weight is for you.  Get at least 30 minutes of exercise that causes your heart to beat faster (aerobic exercise) most days of the week. Activities may include walking, swimming, or biking.  Work with your health care provider or diet and nutrition specialist (dietitian) to adjust your eating plan to your individual calorie needs. Reading food labels  Check food labels for the amount of sodium per serving. Choose foods with less than 5 percent of the Daily Value of sodium. Generally, foods with less than 300 mg of sodium per serving fit into this eating plan.  To find whole grains, look for the word "whole" as the first word in the ingredient list. Shopping  Buy products labeled as "low-sodium" or "no salt added."  Buy fresh foods. Avoid canned foods and premade or frozen meals. Cooking  Avoid adding salt when cooking. Use salt-free seasonings or herbs instead of table salt or sea salt. Check with your health care provider or pharmacist before using salt substitutes.  Do not fry foods. Cook foods using healthy methods such as baking, boiling, grilling, and broiling instead.  Cook with  heart-healthy oils, such as olive, canola, soybean, or sunflower oil. Meal planning   Eat a balanced diet that includes: ? 5 or more servings of fruits and vegetables each day. At each meal, try to fill half of your plate with fruits and vegetables. ? Up to 6-8 servings of whole grains each day. ? Less than 6 oz of lean meat, poultry, or fish each day. A 3-oz serving of meat is about the same size as a deck of cards. One egg equals 1 oz. ? 2 servings of low-fat dairy each day. ? A serving of nuts, seeds, or beans 5 times each week. ? Heart-healthy fats. Healthy fats called Omega-3 fatty acids are found in foods such as flaxseeds and coldwater fish, like sardines, salmon, and mackerel.  Limit how much you eat of the following: ? Canned or prepackaged foods. ? Food that is high in trans fat, such as fried foods. ? Food that is high in saturated fat, such as fatty meat. ? Sweets, desserts, sugary drinks, and other foods with added sugar. ? Full-fat dairy products.  Do not salt foods before eating.  Try to eat at least 2 vegetarian meals each week.  Eat more home-cooked food and less restaurant, buffet, and fast food.  When eating at a restaurant, ask that your food be prepared with less salt or no salt, if possible. What foods are recommended? The items listed may not be a complete list. Talk with your dietitian about what   dietary choices are best for you. Grains Whole-grain or whole-wheat bread. Whole-grain or whole-wheat pasta. Brown rice. Oatmeal. Quinoa. Bulgur. Whole-grain and low-sodium cereals. Pita bread. Low-fat, low-sodium crackers. Whole-wheat flour tortillas. Vegetables Fresh or frozen vegetables (raw, steamed, roasted, or grilled). Low-sodium or reduced-sodium tomato and vegetable juice. Low-sodium or reduced-sodium tomato sauce and tomato paste. Low-sodium or reduced-sodium canned vegetables. Fruits All fresh, dried, or frozen fruit. Canned fruit in natural juice (without  added sugar). Meat and other protein foods Skinless chicken or turkey. Ground chicken or turkey. Pork with fat trimmed off. Fish and seafood. Egg whites. Dried beans, peas, or lentils. Unsalted nuts, nut butters, and seeds. Unsalted canned beans. Lean cuts of beef with fat trimmed off. Low-sodium, lean deli meat. Dairy Low-fat (1%) or fat-free (skim) milk. Fat-free, low-fat, or reduced-fat cheeses. Nonfat, low-sodium ricotta or cottage cheese. Low-fat or nonfat yogurt. Low-fat, low-sodium cheese. Fats and oils Soft margarine without trans fats. Vegetable oil. Low-fat, reduced-fat, or light mayonnaise and salad dressings (reduced-sodium). Canola, safflower, olive, soybean, and sunflower oils. Avocado. Seasoning and other foods Herbs. Spices. Seasoning mixes without salt. Unsalted popcorn and pretzels. Fat-free sweets. What foods are not recommended? The items listed may not be a complete list. Talk with your dietitian about what dietary choices are best for you. Grains Baked goods made with fat, such as croissants, muffins, or some breads. Dry pasta or rice meal packs. Vegetables Creamed or fried vegetables. Vegetables in a cheese sauce. Regular canned vegetables (not low-sodium or reduced-sodium). Regular canned tomato sauce and paste (not low-sodium or reduced-sodium). Regular tomato and vegetable juice (not low-sodium or reduced-sodium). Pickles. Olives. Fruits Canned fruit in a light or heavy syrup. Fried fruit. Fruit in cream or butter sauce. Meat and other protein foods Fatty cuts of meat. Ribs. Fried meat. Bacon. Sausage. Bologna and other processed lunch meats. Salami. Fatback. Hotdogs. Bratwurst. Salted nuts and seeds. Canned beans with added salt. Canned or smoked fish. Whole eggs or egg yolks. Chicken or turkey with skin. Dairy Whole or 2% milk, cream, and half-and-half. Whole or full-fat cream cheese. Whole-fat or sweetened yogurt. Full-fat cheese. Nondairy creamers. Whipped toppings.  Processed cheese and cheese spreads. Fats and oils Butter. Stick margarine. Lard. Shortening. Ghee. Bacon fat. Tropical oils, such as coconut, palm kernel, or palm oil. Seasoning and other foods Salted popcorn and pretzels. Onion salt, garlic salt, seasoned salt, table salt, and sea salt. Worcestershire sauce. Tartar sauce. Barbecue sauce. Teriyaki sauce. Soy sauce, including reduced-sodium. Steak sauce. Canned and packaged gravies. Fish sauce. Oyster sauce. Cocktail sauce. Horseradish that you find on the shelf. Ketchup. Mustard. Meat flavorings and tenderizers. Bouillon cubes. Hot sauce and Tabasco sauce. Premade or packaged marinades. Premade or packaged taco seasonings. Relishes. Regular salad dressings. Where to find more information:  National Heart, Lung, and Blood Institute: www.nhlbi.nih.gov  American Heart Association: www.heart.org Summary  The DASH eating plan is a healthy eating plan that has been shown to reduce high blood pressure (hypertension). It may also reduce your risk for type 2 diabetes, heart disease, and stroke.  With the DASH eating plan, you should limit salt (sodium) intake to 2,300 mg a day. If you have hypertension, you may need to reduce your sodium intake to 1,500 mg a day.  When on the DASH eating plan, aim to eat more fresh fruits and vegetables, whole grains, lean proteins, low-fat dairy, and heart-healthy fats.  Work with your health care provider or diet and nutrition specialist (dietitian) to adjust your eating plan to your individual   calorie needs. This information is not intended to replace advice given to you by your health care provider. Make sure you discuss any questions you have with your health care provider. Document Released: 06/23/2011 Document Revised: 06/27/2016 Document Reviewed: 06/27/2016 Elsevier Interactive Patient Education  2018 Elsevier Inc.  

## 2017-09-01 NOTE — Progress Notes (Signed)
Subjective:    Patient ID: Anthony Hunt, male    DOB: 14-Jun-1951, 67 y.o.   MRN: [id number]  HPI Here for physical  No new concerns Stays active with work--tries to walk some  Doesn't check BP regularly Usually 128/80 when he checks at pharmacy  No problems with statin  Current Outpatient Medications on File Prior to Visit  Medication Sig Dispense Refill  . aspirin 81 MG tablet Take 81 mg by mouth daily.      . benazepril (LOTENSIN) 10 MG tablet TAKE 1 TABLET (10 MG TOTAL) BY MOUTH DAILY. 90 tablet 3  . fluticasone (FLONASE) 50 MCG/ACT nasal spray Place 2 sprays into both nostrils daily.    . metoprolol tartrate (LOPRESSOR) 25 MG tablet TAKE 1/2 TABLET BY MOUTH 2 TIMES A DAY 90 tablet 0  . simvastatin (ZOCOR) 80 MG tablet TAKE 1 TABLET (80 MG TOTAL) BY MOUTH AT BEDTIME. 90 tablet 3   No current facility-administered medications on file prior to visit.     No Known Allergies  Past Medical History:  Diagnosis Date  . Allergy   . CAD (coronary artery disease) 2005   MI  . Hyperlipidemia     Past Surgical History:  Procedure Laterality Date  . CERVICAL DISC SURGERY    . CORONARY STENT PLACEMENT  2005  . ULNAR NERVE REPAIR     Left ulnar nerve decompression 02/07  . US ECHOCARDIOGRAPHY      mild inf hypokinesis, EF 55%  09/05    Family History  Problem Relation Age of Onset  . Coronary artery disease Mother   . Peripheral vascular disease Mother   . Cancer Maternal Uncle   . Cancer Paternal Aunt   . Diabetes Neg Hx   . Hypertension Neg Hx     Social History   Socioeconomic History  . Marital status: Married    Spouse name: Not on file  . Number of children: 1  . Years of education: Not on file  . Highest education level: Not on file  Social Needs  . Financial resource strain: Not on file  . Food insecurity - worry: Not on file  . Food insecurity - inability: Not on file  . Transportation needs - medical: Not on file  . Transportation needs -  non-medical: Not on file  Occupational History  . Occupation: Furniture conservator/restorer  Tobacco Use  . Smoking status: Never Smoker  . Smokeless tobacco: Current User    Types: Chew  Substance and Sexual Activity  . Alcohol use: No    Alcohol/week: 0.0 oz  . Drug use: No  . Sexual activity: Not on file  Other Topics Concern  . Not on file  Social History Narrative  . Not on file   Review of Systems  Constitutional: Negative for fatigue and unexpected weight change.       Wears seat belt  HENT: Negative for dental problem, hearing loss, tinnitus and trouble swallowing.        Keeps up with dentist   Eyes: Negative for visual disturbance.       No diplopia or unilateral vision loss  Respiratory: Negative for cough, chest tightness and shortness of breath.   Cardiovascular: Negative for chest pain, palpitations and leg swelling.  Gastrointestinal: Negative for blood in stool and constipation.       Rare heartburn--tums helps  Genitourinary: Negative for difficulty urinating and urgency.       No sexual problems  Musculoskeletal: Negative for arthralgias,  back pain and joint swelling.  Skin: Negative for rash.       No suspicious lesions  Allergic/Immunologic: Positive for environmental allergies. Negative for immunocompromised state.       Not as bad lately  Neurological: Negative for dizziness, syncope, light-headedness and headaches.  Hematological: Negative for adenopathy. Does not bruise/bleed easily.  Psychiatric/Behavioral: Negative for dysphoric mood. The patient is not nervous/anxious.        Sleeps great on CPAP ---uses every night 7.5-8 hours per night AHI under 2 on the machine       Objective:   Physical Exam  Constitutional: He is oriented to person, place, and time. He appears well-developed. No distress.  HENT:  Head: Normocephalic and atraumatic.  Right Ear: External ear normal.  Left Ear: External ear normal.  Mouth/Throat: Oropharynx is clear and moist. No  oropharyngeal exudate.  Eyes: Conjunctivae are normal. Pupils are equal, round, and reactive to light.  Neck: No thyromegaly present.  Cardiovascular: Normal rate, regular rhythm, normal heart sounds and intact distal pulses. Exam reveals no gallop.  No murmur heard. Pulmonary/Chest: Effort normal and breath sounds normal. No respiratory distress. He has no wheezes. He has no rales.  Abdominal: Soft. He exhibits no distension. There is no tenderness. There is no rebound and no guarding.  Musculoskeletal: He exhibits no edema or tenderness.  Lymphadenopathy:    He has no cervical adenopathy.  Neurological: He is oriented to person, place, and time.  Skin: No rash noted. No erythema.  Psychiatric: He has a normal mood and affect. His behavior is normal.          Assessment & Plan:

## 2017-09-01 NOTE — Assessment & Plan Note (Signed)
No symptoms BP, lipid good On ASA

## 2017-09-01 NOTE — Assessment & Plan Note (Signed)
Does well on the machine

## 2017-09-01 NOTE — Assessment & Plan Note (Signed)
Discussed metformin ---will proceed if still sig elevated

## 2017-09-01 NOTE — Assessment & Plan Note (Signed)
Healthy but still needs to work on fitness Will check PSA after discussion FIT--please do it! DASH Flu vaccine today

## 2017-09-02 ENCOUNTER — Other Ambulatory Visit: Payer: Self-pay | Admitting: Internal Medicine

## 2017-09-02 DIAGNOSIS — R7303 Prediabetes: Secondary | ICD-10-CM

## 2017-09-02 MED ORDER — METFORMIN HCL ER 500 MG PO TB24: 500.0000 mg | ORAL_TABLET | Freq: Every day | ORAL | 3 refills | Status: DC

## 2017-09-02 NOTE — Progress Notes (Signed)
metf

## 2017-10-02 ENCOUNTER — Other Ambulatory Visit: Payer: BLUE CROSS/BLUE SHIELD

## 2017-10-03 ENCOUNTER — Other Ambulatory Visit (INDEPENDENT_AMBULATORY_CARE_PROVIDER_SITE_OTHER): Payer: BLUE CROSS/BLUE SHIELD

## 2017-10-03 DIAGNOSIS — R7303 Prediabetes: Secondary | ICD-10-CM | POA: Diagnosis not present

## 2017-10-03 LAB — RENAL FUNCTION PANEL
Albumin: 4.1 g/dL (ref 3.5–5.2)
BUN: 18 mg/dL (ref 6–23)
CALCIUM: 9.7 mg/dL (ref 8.4–10.5)
CHLORIDE: 104 meq/L (ref 96–112)
CO2: 27 meq/L (ref 19–32)
CREATININE: 1.19 mg/dL (ref 0.40–1.50)
GFR: 65.29 mL/min (ref >60.00)
Glucose, Bld: 134 mg/dL — ABNORMAL HIGH (ref 70–99)
PHOSPHORUS: 3 mg/dL (ref 2.3–4.6)
Potassium: 4.1 mEq/L (ref 3.5–5.1)
Sodium: 138 mEq/L (ref 135–145)

## 2017-11-22 ENCOUNTER — Other Ambulatory Visit: Payer: Self-pay | Admitting: Internal Medicine

## 2017-11-22 NOTE — Telephone Encounter (Signed)
Copied from [place]. Topic: Quick Communication - Rx Refill/Question >> Nov 22, 2017  2:10 PM Ether Griffins B wrote: Medication: simvastatin (ZOCOR) 80 MG tablet           benazepril (LOTENSIN) 10 MG tablet [9220]  Has the patient contacted their pharmacy? Yes.   (Agent: If no, request that the patient contact the pharmacy for the refill.) Preferred Pharmacy (with phone number or street name): [id number] [place], [place]: Please be advised that RX refills may take up to 3 business days. We ask that you follow-up with your pharmacy.

## 2017-11-23 NOTE — Telephone Encounter (Signed)
Benazepril and Simvastatin refills Last OV:09/01/17 Last refilled: Both 08/30/16 (expired prescriptions) [id number] Pharmacy: [id number] [place], [address] [phone] (Phone) [phone] (Fax)

## 2017-11-24 ENCOUNTER — Other Ambulatory Visit: Payer: Self-pay | Admitting: Internal Medicine

## 2017-11-24 MED ORDER — SIMVASTATIN 80 MG PO TABS: ORAL_TABLET | ORAL | 3 refills | Status: DC

## 2017-11-24 MED ORDER — BENAZEPRIL HCL 10 MG PO TABS: ORAL_TABLET | ORAL | 3 refills | Status: DC

## 2017-11-24 NOTE — Telephone Encounter (Signed)
I spoke with pt and pt had annual 09/01/17; no med change for benazepril and simvastatin. Refilled to [facility] [place] and pt voiced understanding.

## 2018-02-05 DIAGNOSIS — H524 Presbyopia: Secondary | ICD-10-CM | POA: Diagnosis not present

## 2018-03-15 ENCOUNTER — Ambulatory Visit: Payer: BLUE CROSS/BLUE SHIELD | Admitting: Family Medicine

## 2018-03-15 ENCOUNTER — Encounter: Payer: Self-pay | Admitting: Family Medicine

## 2018-03-15 VITALS — BP 142/64 | HR 68 | Temp 99.1°F | Ht 68.0 in | Wt 233.5 lb

## 2018-03-15 DIAGNOSIS — J069 Acute upper respiratory infection, unspecified: Secondary | ICD-10-CM | POA: Diagnosis not present

## 2018-03-15 DIAGNOSIS — B9789 Other viral agents as the cause of diseases classified elsewhere: Secondary | ICD-10-CM

## 2018-03-15 MED ORDER — BENZONATATE 200 MG PO CAPS: 200.0000 mg | ORAL_CAPSULE | Freq: Three times a day (TID) | ORAL | 1 refills | Status: DC | PRN

## 2018-03-15 MED ORDER — PROMETHAZINE-DM 6.25-15 MG/5ML PO SYRP: 5.0000 mL | ORAL_SOLUTION | Freq: Four times a day (QID) | ORAL | 0 refills | Status: DC | PRN

## 2018-03-15 NOTE — Progress Notes (Signed)
Subjective:    Patient ID: Anthony Hunt, male    DOB: [birth year], 67 y.o.   MRN: [id number]  HPI Here for uri symptoms for a week    Sore throat- since Saturday -got throat lozenges  A little better yesterday and then worse   Cough- very little phlegm/ no color Sore from coughing  No wheezing /non smoker   Nasal congestion -nasal d/c - not much volume/ more pnd  Ears are fine   Temp: 99.1 F (37.3 C)  No chills Anthony Hunt at home  No sick exposures   otc :  nyquil at night  vics throat lozenges    Patient Active Problem List   Diagnosis Date Noted  . Viral URI with cough 03/15/2018  . Elevated fasting glucose 09/01/2017  . Obstructive sleep apnea 08/16/2013  . Obesity 08/16/2012  . Routine general medical examination at a health care facility 05/19/2011  . Hyperlipemia 03/28/2007  . Coronary atherosclerosis of native coronary artery 03/27/2007  . ALLERGIC RHINITIS 03/27/2007   Past Medical History:  Diagnosis Date  . Allergy   . CAD (coronary artery disease) 2005   MI  . Hyperlipidemia    Past Surgical History:  Procedure Laterality Date  . CERVICAL DISC SURGERY    . CORONARY STENT PLACEMENT  2005  . ULNAR NERVE REPAIR     Left ulnar nerve decompression 02/07  . US ECHOCARDIOGRAPHY      mild inf hypokinesis, EF 55%  09/05   Social History   Tobacco Use  . Smoking status: Never Smoker  . Smokeless tobacco: Current User    Types: Chew  Substance Use Topics  . Alcohol use: No    Alcohol/week: 0.0 standard drinks  . Drug use: No   Family History  Problem Relation Age of Onset  . Coronary artery disease Mother   . Peripheral vascular disease Mother   . Cancer Maternal Uncle   . Cancer Paternal Aunt   . Diabetes Neg Hx   . Hypertension Neg Hx    No Known Allergies Current Outpatient Medications on File Prior to Visit  Medication Sig Dispense Refill  . aspirin 81 MG tablet Take 81 mg by mouth daily.      . benazepril (LOTENSIN) 10 MG  tablet TAKE 1 TABLET (10 MG TOTAL) BY MOUTH DAILY. 90 tablet 3  . fluticasone (FLONASE) 50 MCG/ACT nasal spray Place 2 sprays into both nostrils daily.    . metFORMIN (GLUCOPHAGE-XR) 500 MG 24 hr tablet Take 1 tablet (500 mg total) by mouth daily with breakfast. 90 tablet 3  . metoprolol succinate (TOPROL-XL) 25 MG 24 hr tablet Take 1 tablet (25 mg total) by mouth daily. 90 tablet 3  . simvastatin (ZOCOR) 80 MG tablet TAKE 1 TABLET (80 MG TOTAL) BY MOUTH AT BEDTIME. 90 tablet 3   No current facility-administered medications on file prior to visit.     Review of Systems  Constitutional: Positive for appetite change and fatigue. Negative for fever.  HENT: Positive for congestion, postnasal drip, sneezing and sore throat. Negative for ear pain, rhinorrhea, sinus pressure, sinus pain and voice change.        Mild nasal congestion  No facial pain  Eyes: Negative for pain and discharge.  Respiratory: Positive for cough. Negative for shortness of breath, wheezing and stridor.   Cardiovascular: Negative for chest pain.  Gastrointestinal: Negative for diarrhea, nausea and vomiting.  Genitourinary: Negative for frequency, hematuria and urgency.  Musculoskeletal: Negative for arthralgias and  myalgias.  Skin: Negative for rash.  Neurological: Positive for headaches. Negative for dizziness, weakness and light-headedness.  Psychiatric/Behavioral: Negative for confusion and dysphoric mood.       Objective:   Physical Exam  Constitutional: He appears well-developed and well-nourished. No distress.  overwt and well appearing  HENT:  Head: Normocephalic and atraumatic.  Right Ear: Tympanic membrane, external ear and ear canal normal. No swelling.  Left Ear: Tympanic membrane, external ear and ear canal normal. No swelling.  Mouth/Throat: Posterior oropharyngeal edema present.  Nares are injected and congested (mild) No sinus tenderness Clear rhinorrhea and post nasal drip   Mild post throat  injection  Eyes: Pupils are equal, round, and reactive to light. Conjunctivae and EOM are normal. Right eye exhibits no discharge. Left eye exhibits no discharge.  Neck: Normal range of motion. Neck supple.  Cardiovascular: Normal rate and normal heart sounds.  Pulmonary/Chest: Effort normal and breath sounds normal. No stridor. No respiratory distress. He has no wheezes. He has no rales. He exhibits no tenderness.  Good air exch No rales /rhonchi No wheeze even on forced exp  Harsh bs   Lymphadenopathy:    He has no cervical adenopathy.  Neurological: He is alert. No cranial nerve deficit.  Skin: Skin is warm and dry. No rash noted. No pallor.  Psychiatric: He has a normal mood and affect.          Assessment & Plan:   Problem List Items Addressed This Visit      Respiratory   Viral URI with cough - Primary    Day 6 -reassuring exam and no wheezing  Disc symptomatic care - see instructions on AVS  Px tessalon and prometh DM for cough control as needed Recommend acetaminophen for ST or elevated temp  Fluids/rest  Update if not starting to improve in a week or if worsening

## 2018-03-15 NOTE — Assessment & Plan Note (Signed)
Day 6 -reassuring exam and no wheezing  Disc symptomatic care - see instructions on AVS  Px tessalon and prometh DM for cough control as needed Recommend acetaminophen for ST or elevated temp  Fluids/rest  Update if not starting to improve in a week or if worsening

## 2018-03-15 NOTE — Patient Instructions (Addendum)
I think you have a viral upper respiratory infection  Rest and drink fluids  Take the tessalon three times daily for cough  The prometh- DM - when not working or driving (at night is best)  Get rest when you can   Tylenol over the counter should help sore throat   Update if not starting to improve in a week or if worsening  -especially if you develop worse cough or wheezing

## 2018-06-25 ENCOUNTER — Ambulatory Visit: Payer: BLUE CROSS/BLUE SHIELD | Admitting: Family Medicine

## 2018-06-25 ENCOUNTER — Encounter: Payer: Self-pay | Admitting: Family Medicine

## 2018-06-25 VITALS — BP 144/68 | HR 52 | Temp 98.2°F | Ht 68.0 in | Wt 234.0 lb

## 2018-06-25 DIAGNOSIS — M25511 Pain in right shoulder: Secondary | ICD-10-CM

## 2018-06-25 DIAGNOSIS — Z23 Encounter for immunization: Secondary | ICD-10-CM

## 2018-06-25 MED ORDER — PREDNISONE 10 MG PO TABS: ORAL_TABLET | ORAL | 0 refills | Status: DC

## 2018-06-25 NOTE — Progress Notes (Signed)
   Subjective:    Patient ID: Anthony Hunt, male    DOB: Nov 29, 1950, 67 y.o.   MRN: [id number]  HPI This is a 67 yo male who presents today with right shoulder pain x 3 weeks. Had been doing some heavy lifting prior to pain starting. Pain is constant, dull at rest (2/10), increased with activity (8/10). Pain with sleeping. Can't raise arm over head. No previous shoulder injury. Has been taking 800 mg ibuprofen every 4-6 hours with little relief.   Past Medical History:  Diagnosis Date  . Allergy   . CAD (coronary artery disease) 2005   MI  . Hyperlipidemia    Past Surgical History:  Procedure Laterality Date  . CERVICAL DISC SURGERY    . CORONARY STENT PLACEMENT  2005  . ULNAR NERVE REPAIR     Left ulnar nerve decompression 02/07  . US ECHOCARDIOGRAPHY      mild inf hypokinesis, EF 55%  09/05   Family History  Problem Relation Age of Onset  . Coronary artery disease Mother   . Peripheral vascular disease Mother   . Cancer Maternal Uncle   . Cancer Paternal Aunt   . Diabetes Neg Hx   . Hypertension Neg Hx    Social History   Tobacco Use  . Smoking status: Never Smoker  . Smokeless tobacco: Current User    Types: Chew  Substance Use Topics  . Alcohol use: No    Alcohol/week: 0.0 standard drinks  . Drug use: No      Review of Systems Per HPI    Objective:   Physical Exam  Constitutional: He is oriented to person, place, and time. He appears well-developed and well-nourished.  HENT:  Head: Normocephalic and atraumatic.  Eyes: Conjunctivae are normal.  Cardiovascular: Normal rate.  Pulmonary/Chest: Effort normal.  Musculoskeletal:       Right shoulder: He exhibits decreased range of motion, tenderness, bony tenderness and decreased strength.  Neurological: He is alert and oriented to person, place, and time.  Skin: Skin is warm and dry.  Psychiatric: He has a normal mood and affect. His behavior is normal. Judgment and thought content normal.  Vitals  reviewed.     BP (!) 144/68 (BP Location: Left Arm, Patient Position: Sitting, Cuff Size: Large)   Pulse (!) 52   Temp 98.2 F (36.8 C) (Oral)   Ht 5\' 8"  (1.727 m)   Wt 234 lb (106.1 kg)   SpO2 98%   BMI 35.58 kg/m  Wt Readings from Last 3 Encounters:  06/25/18 234 lb (106.1 kg)  03/15/18 233 lb 8 oz (105.9 kg)  09/01/17 235 lb (106.6 kg)       Assessment & Plan:  1. Acute pain of right shoulder - Ambulatory referral to Orthopedic Surgery - No improvement with high dose OTC NSAID, will give him a prednisone taper. He was advised to avoid any otc NSAID while taking prednisone.  - predniSONE (DELTASONE) 10 MG tablet; Take 6 x 1 day, 5x1, 4x1, 3x1, 2x1, 1x1  Dispense: 18 tablet; Refill: 0- wrong quantity sent to pharmacy who called and dispense number adjusted to 21  2. Need for influenza vaccination - Flu vaccine HIGH DOSE PF   Clarene Reamer, FNP-BC  [facility], [facility]  06/25/2018 3:46 PM

## 2018-06-25 NOTE — Patient Instructions (Signed)
Good to see you today  I have sent in a prescription for prednisone to your pharmacy- take with food and a full glass of water  You will get a call about an orthopedic appointment  Use heat/ice as needed, can also take Tylenol

## 2018-06-26 ENCOUNTER — Encounter: Payer: Self-pay | Admitting: Family Medicine

## 2018-07-02 DIAGNOSIS — M7541 Impingement syndrome of right shoulder: Secondary | ICD-10-CM | POA: Diagnosis not present

## 2018-08-17 ENCOUNTER — Other Ambulatory Visit: Payer: Self-pay | Admitting: Internal Medicine

## 2018-09-07 ENCOUNTER — Encounter: Payer: BLUE CROSS/BLUE SHIELD | Admitting: Internal Medicine

## 2018-10-19 ENCOUNTER — Encounter: Payer: BLUE CROSS/BLUE SHIELD | Admitting: Internal Medicine

## 2018-11-16 ENCOUNTER — Telehealth: Payer: Self-pay | Admitting: Internal Medicine

## 2018-11-16 DIAGNOSIS — R7301 Impaired fasting glucose: Secondary | ICD-10-CM

## 2018-11-16 DIAGNOSIS — E785 Hyperlipidemia, unspecified: Secondary | ICD-10-CM

## 2018-11-16 NOTE — Telephone Encounter (Signed)
Patient is doing virtual cpx on 11/19/18 and coming to get his bp checked at 8:30.  Patient wanted to know if he could have his labs drawn when he comes at 8:30?

## 2018-11-16 NOTE — Telephone Encounter (Signed)
I sent in his refills for 90 days. He has [employer] so we need to do his CPE virtually. I will need to check his BP before the visit. Please schedule. Thanks!

## 2018-11-16 NOTE — Telephone Encounter (Signed)
I left a message on patient's voice mail to call me back.  Please schedule virtual wellness visit with Dr.Letvak and  nurse visit prior to appointment.

## 2018-11-16 NOTE — Telephone Encounter (Signed)
I called patient and I let him know that Dr.Letvak and Larene Beach are out of the office this afternoon.  I asked him to come on Monday at 8:30 fasting and if Dr.Letvak wants him to have labs done, he'll be ready.

## 2018-11-18 NOTE — Telephone Encounter (Signed)
Lab orders placed.  

## 2018-11-19 ENCOUNTER — Encounter: Payer: Self-pay | Admitting: Internal Medicine

## 2018-11-19 ENCOUNTER — Other Ambulatory Visit: Payer: Self-pay

## 2018-11-19 ENCOUNTER — Other Ambulatory Visit (INDEPENDENT_AMBULATORY_CARE_PROVIDER_SITE_OTHER): Payer: BLUE CROSS/BLUE SHIELD

## 2018-11-19 ENCOUNTER — Ambulatory Visit: Payer: BLUE CROSS/BLUE SHIELD

## 2018-11-19 ENCOUNTER — Ambulatory Visit (INDEPENDENT_AMBULATORY_CARE_PROVIDER_SITE_OTHER): Payer: BLUE CROSS/BLUE SHIELD | Admitting: Internal Medicine

## 2018-11-19 VITALS — BP 134/84

## 2018-11-19 VITALS — BP 134/84 | Ht 69.0 in | Wt 230.0 lb

## 2018-11-19 DIAGNOSIS — G4733 Obstructive sleep apnea (adult) (pediatric): Secondary | ICD-10-CM

## 2018-11-19 DIAGNOSIS — E785 Hyperlipidemia, unspecified: Secondary | ICD-10-CM

## 2018-11-19 DIAGNOSIS — I2510 Atherosclerotic heart disease of native coronary artery without angina pectoris: Secondary | ICD-10-CM | POA: Diagnosis not present

## 2018-11-19 DIAGNOSIS — R7301 Impaired fasting glucose: Secondary | ICD-10-CM | POA: Diagnosis not present

## 2018-11-19 DIAGNOSIS — Z1211 Encounter for screening for malignant neoplasm of colon: Secondary | ICD-10-CM

## 2018-11-19 DIAGNOSIS — M79641 Pain in right hand: Secondary | ICD-10-CM

## 2018-11-19 DIAGNOSIS — M79643 Pain in unspecified hand: Secondary | ICD-10-CM | POA: Insufficient documentation

## 2018-11-19 DIAGNOSIS — Z0000 Encounter for general adult medical examination without abnormal findings: Secondary | ICD-10-CM

## 2018-11-19 DIAGNOSIS — M79642 Pain in left hand: Secondary | ICD-10-CM

## 2018-11-19 DIAGNOSIS — R7303 Prediabetes: Secondary | ICD-10-CM

## 2018-11-19 DIAGNOSIS — D696 Thrombocytopenia, unspecified: Secondary | ICD-10-CM | POA: Insufficient documentation

## 2018-11-19 LAB — COMPREHENSIVE METABOLIC PANEL
ALT: 28 U/L (ref 0–53)
AST: 24 U/L (ref 0–37)
Albumin: 3.9 g/dL (ref 3.5–5.2)
Alkaline Phosphatase: 50 U/L (ref 39–117)
BUN: 21 mg/dL (ref 6–23)
CO2: 27 mEq/L (ref 19–32)
Calcium: 9.1 mg/dL (ref 8.4–10.5)
Chloride: 105 mEq/L (ref 96–112)
Creatinine, Ser: 1.25 mg/dL (ref 0.40–1.50)
GFR: 57.84 mL/min — ABNORMAL LOW (ref >60.00)
Glucose, Bld: 181 mg/dL — ABNORMAL HIGH (ref 70–99)
Potassium: 5.2 mEq/L — ABNORMAL HIGH (ref 3.5–5.1)
Sodium: 140 mEq/L (ref 135–145)
Total Bilirubin: 0.5 mg/dL (ref 0.2–1.2)
Total Protein: 6.1 g/dL (ref 6.0–8.3)

## 2018-11-19 LAB — HEMOGLOBIN A1C: Hgb A1c MFr Bld: 6.8 % — ABNORMAL HIGH (ref 4.6–6.5)

## 2018-11-19 LAB — CBC
HCT: 46.6 % (ref 39.0–52.0)
Hemoglobin: 16.2 g/dL (ref 13.0–17.0)
MCHC: 34.8 g/dL (ref 30.0–36.0)
MCV: 98.1 fl (ref 78.0–100.0)
Platelets: 130 10*3/uL — ABNORMAL LOW (ref 150.0–400.0)
RBC: 4.75 Mil/uL (ref 4.22–5.81)
RDW: 13.1 % (ref 11.5–15.5)
WBC: 5.9 10*3/uL (ref 4.0–10.5)

## 2018-11-19 LAB — LIPID PANEL
Cholesterol: 139 mg/dL (ref 0–200)
HDL: 32.6 mg/dL — ABNORMAL LOW (ref >39.00)
LDL Cholesterol: 78 mg/dL (ref 0–99)
NonHDL: 106.04
Total CHOL/HDL Ratio: 4
Triglycerides: 138 mg/dL (ref 0.0–149.0)
VLDL: 27.6 mg/dL (ref 0.0–40.0)

## 2018-11-19 MED ORDER — SIMVASTATIN 80 MG PO TABS: ORAL_TABLET | ORAL | 3 refills | Status: DC

## 2018-11-19 MED ORDER — METFORMIN HCL ER 500 MG PO TB24: ORAL_TABLET | ORAL | 3 refills | Status: DC

## 2018-11-19 MED ORDER — METFORMIN HCL ER 500 MG PO TB24: 1000.0000 mg | ORAL_TABLET | Freq: Every day | ORAL | 3 refills | Status: DC

## 2018-11-19 MED ORDER — BENAZEPRIL HCL 10 MG PO TABS: ORAL_TABLET | ORAL | 3 refills | Status: DC

## 2018-11-19 MED ORDER — METOPROLOL SUCCINATE ER 25 MG PO TB24: 25.0000 mg | ORAL_TABLET | Freq: Every day | ORAL | 3 refills | Status: DC

## 2018-11-19 NOTE — Progress Notes (Addendum)
Subjective:    Patient ID: Anthony Hunt, male    DOB: 05/03/1951, 68 y.o.   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 aspirin 81 MG tablet Take 81 mg by mouth daily.      . benazepril (LOTENSIN) 10 MG tablet TAKE 1 TABLET BY MOUTH EVERY DAY 90 tablet 0  . metFORMIN (GLUCOPHAGE-XR) 500 MG 24 hr tablet TAKE 1 TABLET BY MOUTH EVERY DAY WITH BREAKFAST 90 tablet 0  . metoprolol succinate (TOPROL-XL) 25 MG 24 hr tablet TAKE 1 TABLET BY MOUTH EVERY DAY 90 tablet 0  . simvastatin (ZOCOR) 80 MG tablet TAKE 1 TABLET BY MOUTH EVERYDAY AT BEDTIME 90 tablet 0   No current facility-administered medications on file prior to visit.     No Known Allergies  Past Medical History:  Diagnosis Date  . Allergy   . CAD (coronary artery disease) 2005   MI  . Hyperlipidemia     Past Surgical History:  Procedure Laterality Date  . CERVICAL DISC SURGERY    . CORONARY STENT PLACEMENT  2005  . ULNAR NERVE REPAIR     Left ulnar nerve decompression 02/07  . US ECHOCARDIOGRAPHY      mild inf hypokinesis, EF 55%  09/05    Family History  Problem Relation Age of Onset  . Coronary artery disease Mother   . Peripheral vascular disease  Mother   . Cancer Maternal Uncle   . Cancer Paternal Aunt   . Diabetes Neg Hx   . Hypertension Neg Hx     Social History   Socioeconomic History  . Marital status: Married    Spouse name: Not on file  . Number of children: 1  . Years of education: Not on file  . Highest education level: Not on file  Occupational History  . Occupation: Furniture conservator/restorer  Social Needs  . Financial resource strain: Not on file  . Food insecurity:    Worry: Not on file    Inability: Not on file  . Transportation needs:    Medical: Not on file    Non-medical: Not on file  Tobacco Use  . Smoking status: Never Smoker  . Smokeless tobacco: Current User    Types: Chew  Substance and Sexual Activity  . Alcohol use: No    Alcohol/week: 0.0 standard drinks  . Drug use: No  . Sexual activity: Not on file  Lifestyle  . Physical activity:    Days per week: Not on file    Minutes per session: Not on file  . Stress: Not on file  Relationships  . Social connections:    Talks on phone: Not on file  Gets together: Not on file    Attends religious service: Not on file    Active member of club or organization: Not on file    Attends meetings of clubs or organizations: Not on file    Relationship status: Not on file  . Intimate partner violence:    Fear of current or ex partner: Not on file    Emotionally abused: Not on file    Physically abused: Not on file    Forced sexual activity: Not on file  Other Topics Concern  . Not on file  Social History Narrative  . Not on file   Review of Systems  Constitutional: Negative for fatigue and unexpected weight change.       Wears seat belt  HENT: Negative for dental problem, hearing loss, tinnitus and trouble swallowing.        Keeps up with dentist  Eyes: Negative for visual disturbance.       No diplopia or unilateral vision loss  Respiratory: Negative for cough, chest tightness and shortness of breath.   Cardiovascular: Negative for chest pain,  palpitations and leg swelling.  Gastrointestinal: Negative for blood in stool and constipation.       No heartburn lately  Endocrine: Negative for polydipsia and polyuria.  Genitourinary: Negative for difficulty urinating and urgency.  Musculoskeletal: Negative for back pain and joint swelling.       Hand pain  Skin: Negative for rash.       No suspicious lesions  Allergic/Immunologic: Positive for environmental allergies. Negative for immunocompromised state.       Does fine with loratadine---bid   Neurological: Negative for dizziness, syncope, light-headedness and headaches.  Hematological: Negative for adenopathy. Does not bruise/bleed easily.  Psychiatric/Behavioral: Negative for dysphoric mood and sleep disturbance. The patient is not nervous/anxious.        Objective:   Physical Exam  Constitutional: He appears well-developed. No distress.  Respiratory: Effort normal. No respiratory distress.  Musculoskeletal:     Comments: ROM in fingers seems full No obvious synovitis in fingers  Psychiatric: He has a normal mood and affect. His behavior is normal.           Assessment & Plan:

## 2018-11-19 NOTE — Addendum Note (Signed)
Addended by: Viviana Simpler I on: 11/19/2018 12:43 PM   Modules accepted: Orders

## 2018-11-19 NOTE — Assessment & Plan Note (Signed)
Again just hasn't done the FIT---discussed and he agrees to go ahead with colonoscopy Consider PSA again next year Discussed fitness Flu vaccine in the fall Will get shingrix when available

## 2018-11-19 NOTE — Assessment & Plan Note (Signed)
Likely early OA Discussed heat/tylenol May need in office assessment if worsens

## 2018-11-19 NOTE — Assessment & Plan Note (Signed)
Chronic, mild and no symptoms

## 2018-11-19 NOTE — Assessment & Plan Note (Signed)
Does well with the CPAP Uses every night--all night

## 2018-11-19 NOTE — Assessment & Plan Note (Signed)
No symptoms Is on statin, beta blocker, ACEI and ASA

## 2018-11-19 NOTE — Assessment & Plan Note (Signed)
Hopefully sugars will be better on the metformin

## 2018-12-20 ENCOUNTER — Encounter: Payer: Self-pay | Admitting: Gastroenterology

## 2019-01-10 ENCOUNTER — Ambulatory Visit: Payer: BC Managed Care – PPO | Admitting: *Deleted

## 2019-01-10 ENCOUNTER — Other Ambulatory Visit: Payer: Self-pay

## 2019-01-10 VITALS — Ht 69.0 in | Wt 230.0 lb

## 2019-01-10 DIAGNOSIS — Z1211 Encounter for screening for malignant neoplasm of colon: Secondary | ICD-10-CM

## 2019-01-10 MED ORDER — SUPREP BOWEL PREP KIT 17.5-3.13-1.6 GM/177ML PO SOLN: 1.0000 | Freq: Once | ORAL | 0 refills | Status: AC

## 2019-01-10 NOTE — Progress Notes (Signed)
No egg or soy allergy known to patient  No issues with past sedation with any surgeries  or procedures, no intubation problems  No diet pills per patient No home 02 use per patient  No blood thinners per patient  Pt denies issues with constipation  No A fib or A flutter  EMMI video sent to pt's e mail   If you use chewing tobacco or snuff, please DO NOT use this the day of the procedure. If you do, your procedure will be cancelled !!  Pt verified name, DOB, address and insurance during PV today. Pt mailed instruction packet to included paper to complete and mail back to [facility] with addressed and stamped envelope, Emmi video, copy of consent form to read and not return, and instructions. Suprep $15  coupon mailed in packet. PV completed over the phone. Pt encouraged to call with questions or issues   Pt is aware that care partner will wait in the car during parking lot; if they feel like they will be too hot to wait in the car; they may wait in the lobby.  We want them to wear a mask (we do not have any that we can provide them), practice social distancing, and we will check their temperatures when they get here.  I did remind patient that their care partner needs to stay in the parking lot the entire time. Pt will wear mask into building

## 2019-01-11 ENCOUNTER — Telehealth: Payer: Self-pay | Admitting: Gastroenterology

## 2019-01-11 NOTE — Telephone Encounter (Signed)
Pt called back regarding response from his insurance.

## 2019-01-11 NOTE — Telephone Encounter (Signed)
Spoke with patient. He states he called his insurance company and the screening colonoscopy is covered. No further questions or concerns from the patient at this time. He will call us back if needed.

## 2019-01-24 ENCOUNTER — Encounter: Payer: BLUE CROSS/BLUE SHIELD | Admitting: Gastroenterology

## 2019-01-25 ENCOUNTER — Telehealth: Payer: Self-pay | Admitting: Gastroenterology

## 2019-01-25 NOTE — Telephone Encounter (Signed)
It will be better to test him prior to the procedure given his exposure history even though he is asymptomatic. We can either reschedule the procedure for 2 weeks without testing, but if patient wants to reschedule within 2 weeks, we will need to proceed with coronavirus test either through his primary care office or [facility] testing

## 2019-01-25 NOTE — Telephone Encounter (Signed)

## 2019-01-25 NOTE — Telephone Encounter (Addendum)
After speaking with Dr. Silverio Decamp, phoned patient and advised him that we would either need to get him tested for covid or reschedule further out.  He wishes to just reschedule.  Changed his appointment to 7/27 at 4:00pm Will plan to send new prep instructions via my chart.

## 2019-01-25 NOTE — Telephone Encounter (Signed)
Ok thanks 

## 2019-01-25 NOTE — Telephone Encounter (Signed)
Patient co-worker tested positive saw her last Tuesday 6/30  She was tested positive on 7/02. Please call patient if he is okay to come in.

## 2019-01-28 ENCOUNTER — Encounter: Payer: BLUE CROSS/BLUE SHIELD | Admitting: Gastroenterology

## 2019-02-11 ENCOUNTER — Encounter: Payer: BC Managed Care – PPO | Admitting: Gastroenterology

## 2019-02-21 ENCOUNTER — Telehealth: Payer: Self-pay | Admitting: *Deleted

## 2019-02-21 NOTE — Telephone Encounter (Signed)
New prep instructions sent via MyChart 

## 2019-02-21 NOTE — Telephone Encounter (Signed)
-----   Message from Bea Laura, RN sent at 01/29/2019  7:40 AM EDT ----- Had to reschedule this patient d/t exposure to covid.  Can you redo his prep instructions and send via mychart? Thanks!

## 2019-03-08 ENCOUNTER — Telehealth: Payer: Self-pay | Admitting: Gastroenterology

## 2019-03-08 NOTE — Telephone Encounter (Addendum)
Called back and answered "NO" to all screening questions.  Pt advised that care partner is welcome to wait in the care or come up to the lobby during procedure as long as they enter the building wearing a mask.

## 2019-03-08 NOTE — Telephone Encounter (Signed)

## 2019-03-11 ENCOUNTER — Other Ambulatory Visit: Payer: Self-pay

## 2019-03-11 ENCOUNTER — Ambulatory Visit (AMBULATORY_SURGERY_CENTER): Payer: BC Managed Care – PPO | Admitting: Gastroenterology

## 2019-03-11 ENCOUNTER — Encounter: Payer: Self-pay | Admitting: Gastroenterology

## 2019-03-11 VITALS — BP 138/69 | HR 52 | Temp 95.9°F | Resp 17 | Ht 69.0 in | Wt 230.0 lb

## 2019-03-11 DIAGNOSIS — D125 Benign neoplasm of sigmoid colon: Secondary | ICD-10-CM

## 2019-03-11 DIAGNOSIS — Z1211 Encounter for screening for malignant neoplasm of colon: Secondary | ICD-10-CM | POA: Diagnosis not present

## 2019-03-11 DIAGNOSIS — D128 Benign neoplasm of rectum: Secondary | ICD-10-CM

## 2019-03-11 DIAGNOSIS — D127 Benign neoplasm of rectosigmoid junction: Secondary | ICD-10-CM | POA: Diagnosis not present

## 2019-03-11 MED ORDER — SODIUM CHLORIDE 0.9 % IV SOLN: 500.0000 mL | Freq: Once | INTRAVENOUS | Status: DC

## 2019-03-11 NOTE — Progress Notes (Signed)
No problems noted in the recovery room. maw 

## 2019-03-11 NOTE — Progress Notes (Signed)
Called to room to assist during endoscopic procedure.  Patient ID and intended procedure confirmed with present staff. Received instructions for my participation in the procedure from the performing physician.  

## 2019-03-11 NOTE — Patient Instructions (Signed)
YOU HAD AN ENDOSCOPIC PROCEDURE TODAY AT [facility]:   Refer to the procedure report that was given to you for any specific questions about what was found during the examination.  If the procedure report does not answer your questions, please call your gastroenterologist to clarify.  If you requested that your care partner not be given the details of your procedure findings, then the procedure report has been included in a sealed envelope for you to review at your convenience later.  YOU SHOULD EXPECT: Some feelings of bloating in the abdomen. Passage of more gas than usual.  Walking can help get rid of the air that was put into your GI tract during the procedure and reduce the bloating. If you had a lower endoscopy (such as a colonoscopy or flexible sigmoidoscopy) you may notice spotting of blood in your stool or on the toilet paper. If you underwent a bowel prep for your procedure, you may not have a normal bowel movement for a few days.  Please Note:  You might notice some irritation and congestion in your nose or some drainage.  This is from the oxygen used during your procedure.  There is no need for concern and it should clear up in a day or so.  SYMPTOMS TO REPORT IMMEDIATELY:   Following lower endoscopy (colonoscopy or flexible sigmoidoscopy):  Excessive amounts of blood in the stool  Significant tenderness or worsening of abdominal pains  Swelling of the abdomen that is new, acute  Fever of 100F or higher    For urgent or emergent issues, a gastroenterologist can be reached at any hour by calling [phone].   DIET:  We do recommend a small meal at first, but then you may proceed to your regular diet.  Drink plenty of fluids but you should avoid alcoholic beverages for 24 hours.  ACTIVITY:  You should plan to take it easy for the rest of today and you should NOT DRIVE or use heavy machinery until tomorrow (because of the sedation medicines used during the test).     FOLLOW UP: Our staff will call the number listed on your records 48-72 hours following your procedure to check on you and address any questions or concerns that you may have regarding the information given to you following your procedure. If we do not reach you, we will leave a message.  We will attempt to reach you two times.  During this call, we will ask if you have developed any symptoms of COVID 19. If you develop any symptoms (ie: fever, flu-like symptoms, shortness of breath, cough etc.) before then, please call [phone].  If you test positive for Covid 19 in the 2 weeks post procedure, please call and report this information to [place].    If any biopsies were taken you will be contacted by phone or by letter within the next 1-3 weeks.  Please call us at [phone] if you have not heard about the biopsies in 3 weeks.    SIGNATURES/CONFIDENTIALITY: You and/or your care partner have signed paperwork which will be entered into your electronic medical record.  These signatures attest to the fact that that the information above on your After Visit Summary has been reviewed and is understood.  Full responsibility of the confidentiality of this discharge information lies with you and/or your care-partner.    Handouts were given to you on polyps, diverticulosis, and  Hemorrhoids. Your blood sugar was 136 in the recovery room. You may resume  current medications today. Await biopsy results. Please call if any questions or concerns.   

## 2019-03-11 NOTE — Progress Notes (Signed)
Report given to PACU, vss 

## 2019-03-11 NOTE — Op Note (Signed)
[place] Patient Name: Anthony Hunt Procedure Date: 03/11/2019 7:38 AM MRN: [id number] Endoscopist: Mauri Pole , MD Age: 68 Referring MD:  Date of Birth: 1950/11/02 Gender: Male Account #: [id number] Procedure:                Colonoscopy Indications:              Screening for colorectal malignant neoplasm Medicines:                Monitored Anesthesia Care Procedure:                Pre-Anesthesia Assessment:                           - Prior to the procedure, a History and Physical                            was performed, and patient medications and                            allergies were reviewed. The patient's tolerance of                            previous anesthesia was also reviewed. The risks                            and benefits of the procedure and the sedation                            options and risks were discussed with the patient.                            All questions were answered, and informed consent                            was obtained. Prior Anticoagulants: The patient has                            taken no previous anticoagulant or antiplatelet                            agents. ASA Grade Assessment: III - A patient with                            severe systemic disease. After reviewing the risks                            and benefits, the patient was deemed in                            satisfactory condition to undergo the procedure.                           After obtaining informed consent, the colonoscope  was passed under direct vision. Throughout the                            procedure, the patient's blood pressure, pulse, and                            oxygen saturations were monitored continuously. The                            Colonoscope was introduced through the anus and                            advanced to the the terminal ileum, with                            identification of  the appendiceal orifice and IC                            valve. The colonoscopy was performed without                            difficulty. The patient tolerated the procedure                            well. The quality of the bowel preparation was                            adequate. The terminal ileum, ileocecal valve,                            appendiceal orifice, and rectum were photographed. Scope In: 7:44:35 AM Scope Out: 8:04:30 AM Scope Withdrawal Time: 0 hours 13 minutes 56 seconds  Total Procedure Duration: 0 hours 19 minutes 55 seconds  Findings:                 The perianal and digital rectal examinations were                            normal.                           Four sessile polyps were found in the rectum. The                            polyps were 4 to 7 mm in size. These polyps were                            removed with a cold snare. Resection and retrieval                            were complete.                           A 8 mm polyp was found in the sigmoid colon. The  polyp was semi-pedunculated. The polyp was removed                            with a hot snare. Resection and retrieval were                            complete.                           A 2 mm polyp was found in the rectum. The polyp was                            sessile. The polyp was removed with a cold biopsy                            forceps. Resection and retrieval were complete.                           Scattered small and large-mouthed diverticula were                            found in the sigmoid colon, descending colon,                            transverse colon, ascending colon and cecum.                           Non-bleeding internal hemorrhoids were found during                            retroflexion. The hemorrhoids were small. Complications:            No immediate complications. Estimated Blood Loss:     Estimated blood loss was  minimal. Impression:               - Four 4 to 7 mm polyps in the rectum, removed with                            a cold snare. Resected and retrieved.                           - One 8 mm polyp in the sigmoid colon, removed with                            a hot snare. Resected and retrieved.                           - One 2 mm polyp in the rectum, removed with a cold                            biopsy forceps. Resected and retrieved.                           - Moderate diverticulosis in the sigmoid  colon, in                            the descending colon, in the transverse colon, in                            the ascending colon and in the cecum.                           - Non-bleeding internal hemorrhoids. Recommendation:           - Patient has a contact number available for                            emergencies. The signs and symptoms of potential                            delayed complications were discussed with the                            patient. Return to normal activities tomorrow.                            Written discharge instructions were provided to the                            patient.                           - Resume previous diet.                           - Continue present medications.                           - Await pathology results.                           - Repeat colonoscopy in 3 years for surveillance                            based on pathology results. Mauri Pole, MD 03/11/2019 8:10:42 AM This report has been signed electronically.

## 2019-03-11 NOTE — Progress Notes (Signed)
Pt's states no medical or surgical changes since previsit or office visit.  Temp taken by JB VS taken by CW 

## 2019-03-13 ENCOUNTER — Telehealth: Payer: Self-pay | Admitting: *Deleted

## 2019-03-13 NOTE — Telephone Encounter (Signed)
  Follow up Call-  Call back number 03/11/2019  Post procedure Call Back phone  # [phone]  Permission to leave phone message Yes  Some recent data might be hidden    [facility]

## 2019-03-13 NOTE — Telephone Encounter (Signed)
  Follow up Call-  Call back number 03/11/2019  Post procedure Call Back phone  # [phone]  Permission to leave phone message Yes  Some recent data might be hidden     Patient questions:  Do you have a fever, pain , or abdominal swelling? No. Pain Score  0 *  Have you tolerated food without any problems? Yes.    Have you been able to return to your normal activities? Yes.    Do you have any questions about your discharge instructions: Diet   No. Medications  No. Follow up visit  No.  Do you have questions or concerns about your Care? No.  Actions: * If pain score is 4 or above: No action needed, pain <4.   1. Have you developed a fever since your procedure? no  2.   Have you had an respiratory symptoms (SOB or cough) since your procedure? no  3.   Have you tested positive for COVID 19 since your procedure no  4.   Have you had any family members/close contacts diagnosed with the COVID 19 since your procedure?  no   If yes to any of these questions please route to Joylene John, RN and Alphonsa Gin, Therapist, sports.

## 2019-03-15 ENCOUNTER — Telehealth: Payer: Self-pay | Admitting: Gastroenterology

## 2019-03-15 NOTE — Telephone Encounter (Signed)
Spoke with the patient. He understands the pathology has not been reviewed by his primary GI and he will receive a letter with this information. Advised the pathology states no high grade dysplasia or malignancy

## 2019-03-19 ENCOUNTER — Encounter: Payer: Self-pay | Admitting: Gastroenterology

## 2019-03-21 ENCOUNTER — Other Ambulatory Visit: Payer: Self-pay

## 2019-03-21 ENCOUNTER — Ambulatory Visit (INDEPENDENT_AMBULATORY_CARE_PROVIDER_SITE_OTHER): Payer: BC Managed Care – PPO

## 2019-03-21 DIAGNOSIS — Z23 Encounter for immunization: Secondary | ICD-10-CM | POA: Diagnosis not present

## 2019-05-22 ENCOUNTER — Ambulatory Visit: Payer: BLUE CROSS/BLUE SHIELD | Admitting: Internal Medicine

## 2019-05-28 ENCOUNTER — Ambulatory Visit: Payer: BC Managed Care – PPO | Admitting: Internal Medicine

## 2019-05-28 ENCOUNTER — Encounter: Payer: Self-pay | Admitting: Internal Medicine

## 2019-05-28 ENCOUNTER — Other Ambulatory Visit: Payer: Self-pay

## 2019-05-28 DIAGNOSIS — E1159 Type 2 diabetes mellitus with other circulatory complications: Secondary | ICD-10-CM

## 2019-05-28 LAB — POCT GLYCOSYLATED HEMOGLOBIN (HGB A1C): Hemoglobin A1C: 6.4 % — AB (ref 4.0–5.6)

## 2019-05-28 LAB — HM DIABETES FOOT EXAM

## 2019-05-28 NOTE — Progress Notes (Signed)
Subjective:    Patient ID: Anthony Hunt, male    DOB: 03-08-1951, 68 y.o.   MRN: [id number]  HPI Here for follow up of diabetes---recently changed diagnosis from prediabetes to diabetes  He has changed the way he is eating somewhat Less fast food Lots of grilled chicken, etc Still not really exercising Hasn't been checking sugars  No foot sores or numbness No chest pain No SOB  Current Outpatient Medications on File Prior to Visit  Medication Sig Dispense Refill  . aspirin 81 MG tablet Take 81 mg by mouth daily.      . benazepril (LOTENSIN) 10 MG tablet TAKE 1 TABLET BY MOUTH EVERY DAY 90 tablet 3  . metFORMIN (GLUCOPHAGE-XR) 500 MG 24 hr tablet Take 2 tablets (1,000 mg total) by mouth daily with breakfast. TAKE 1 TABLET BY MOUTH EVERY DAY WITH BREAKFAST (Patient taking differently: Take 1,000 mg by mouth daily with breakfast. ) 180 tablet 3  . metoprolol succinate (TOPROL-XL) 25 MG 24 hr tablet Take 1 tablet (25 mg total) by mouth daily. 90 tablet 3  . simvastatin (ZOCOR) 80 MG tablet TAKE 1 TABLET BY MOUTH EVERYDAY AT BEDTIME 90 tablet 3   No current facility-administered medications on file prior to visit.     No Known Allergies  Past Medical History:  Diagnosis Date  . Allergy   . 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 CAD (coronary artery disease) 2005   MI  . Hyperlipidemia   . Hypertension   . Myocardial infarction ([place]) 2005  . Pre-diabetes   . Sleep apnea    Uses Cpap    Past Surgical History:  Procedure Laterality Date  . CERVICAL DISC SURGERY    . CORONARY STENT PLACEMENT  2005  . ULNAR NERVE REPAIR     Left ulnar nerve decompression 02/07  . US ECHOCARDIOGRAPHY      mild inf hypokinesis, EF 55%  09/05    Family History  Problem Relation Age of Onset  . Coronary artery disease Mother   . Peripheral vascular disease Mother   . Cancer Maternal Uncle   . Cancer Paternal Aunt   . Diabetes Neg Hx   . Hypertension Neg Hx   . Colon  cancer Neg Hx   . Colon polyps Neg Hx   . Esophageal cancer Neg Hx   . Rectal cancer Neg Hx   . Stomach cancer Neg Hx     Social History   Socioeconomic History  . Marital status: Married    Spouse name: Not on file  . Number of children: 1  . Years of education: Not on file  . Highest education level: Not on file  Occupational History  . Occupation: Furniture conservator/restorer  Social Needs  . Financial resource strain: Not on file  . Food insecurity    Worry: Not on file    Inability: Not on file  . Transportation needs    Medical: Not on file    Non-medical: Not on file  Tobacco Use  . Smoking status: Never Smoker  . Smokeless tobacco: Current User    Types: Chew  Substance and Sexual Activity  . Alcohol use: No    Alcohol/week: 0.0 standard drinks  . Drug use: No  . Sexual activity: Not on file  Lifestyle  . Physical activity    Days per week: Not on file    Minutes per session: Not on file  . Stress: Not on  file  Relationships  . Social Herbalist on phone: Not on file    Gets together: Not on file    Attends religious service: Not on file    Active member of club or organization: Not on file    Attends meetings of clubs or organizations: Not on file    Relationship status: Not on file  . Intimate partner violence    Fear of current or ex partner: Not on file    Emotionally abused: Not on file    Physically abused: Not on file    Forced sexual activity: Not on file  Other Topics Concern  . Not on file  Social History Narrative  . Not on file   Review of Systems Weight is about the same Sleeps well    Objective:   Physical Exam  Constitutional: He appears well-developed. No distress.  Neck: No thyromegaly present.  Cardiovascular: Normal rate, regular rhythm, normal heart sounds and intact distal pulses. Exam reveals no gallop.  No murmur heard. Respiratory: Effort normal and breath sounds normal. No respiratory distress. He has no wheezes. He  has no rales.  Musculoskeletal:        General: No edema.  Lymphadenopathy:    He has no cervical adenopathy.  Neurological:  Normal sensation in feet  Skin:  No foot lesions  Psychiatric: He has a normal mood and affect. His behavior is normal.           Assessment & Plan:

## 2019-05-28 NOTE — Assessment & Plan Note (Addendum)
Now over the threshold for diabetes Has improved his eating Will set up diabetic education Lab Results  Component Value Date   HGBA1C 6.4 (A) 05/28/2019   Has improved Borderline creatinine--is on ACEI Known CAD

## 2019-07-01 ENCOUNTER — Other Ambulatory Visit: Payer: Self-pay

## 2019-07-01 ENCOUNTER — Encounter: Payer: Self-pay | Admitting: Internal Medicine

## 2019-07-01 ENCOUNTER — Ambulatory Visit: Payer: BC Managed Care – PPO | Admitting: Internal Medicine

## 2019-07-01 DIAGNOSIS — M5417 Radiculopathy, lumbosacral region: Secondary | ICD-10-CM

## 2019-07-01 MED ORDER — VALACYCLOVIR HCL 1 G PO TABS: 1000.0000 mg | ORAL_TABLET | Freq: Three times a day (TID) | ORAL | 1 refills | Status: DC

## 2019-07-01 NOTE — Progress Notes (Signed)
Subjective:    Patient ID: Anthony Hunt, male    DOB: 1951-04-22, 68 y.o.   MRN: [id number]  HPI Here due to left flank pain--concerned about recurrent shingles  This visit occurred during the SARS-CoV-2 public health emergency.  Safety protocols were in place, including screening questions prior to the visit, additional usage of staff PPE, and extensive cleaning of exam room while observing appropriate contact time as indicated for disinfecting solutions.   Started 3 days ago--feels "like needles sticking in me" Can't stand to be touched there Fairly constant at first---"let off a little yesterday" Able to sleep better last night Better today--though still present  Feels exactly the same as 3 other times in same spot--that was presumed to be shingles No lesions again  Current Outpatient Medications on File Prior to Visit  Medication Sig Dispense Refill  . aspirin 81 MG tablet Take 81 mg by mouth daily.      . benazepril (LOTENSIN) 10 MG tablet TAKE 1 TABLET BY MOUTH EVERY DAY 90 tablet 3  . metFORMIN (GLUCOPHAGE-XR) 500 MG 24 hr tablet Take 1,000 mg by mouth daily with breakfast.    . metoprolol succinate (TOPROL-XL) 25 MG 24 hr tablet Take 1 tablet (25 mg total) by mouth daily. 90 tablet 3  . simvastatin (ZOCOR) 80 MG tablet TAKE 1 TABLET BY MOUTH EVERYDAY AT BEDTIME 90 tablet 3   No current facility-administered medications on file prior to visit.    No Known Allergies  Past Medical History:  Diagnosis Date  . Allergy   . Bright's disease    age [age]  no issues since   . CAD (coronary artery disease) 2005   MI  . Hyperlipidemia   . Hypertension   . Myocardial infarction ([place]) 2005  . Pre-diabetes   . Sleep apnea    Uses Cpap    Past Surgical History:  Procedure Laterality Date  . CERVICAL DISC SURGERY    . CORONARY STENT PLACEMENT  2005  . ULNAR NERVE REPAIR     Left ulnar nerve decompression 02/07  . US ECHOCARDIOGRAPHY      mild inf hypokinesis, EF 55%   09/05    Family History  Problem Relation Age of Onset  . Coronary artery disease Mother   . Peripheral vascular disease Mother   . Cancer Maternal Uncle   . Cancer Paternal Aunt   . Diabetes Neg Hx   . Hypertension Neg Hx   . Colon cancer Neg Hx   . Colon polyps Neg Hx   . Esophageal cancer Neg Hx   . Rectal cancer Neg Hx   . Stomach cancer Neg Hx     Social History   Socioeconomic History  . Marital status: Married    Spouse name: Not on file  . Number of children: 1  . Years of education: Not on file  . Highest education level: Not on file  Occupational History  . Occupation: Furniture conservator/restorer  Tobacco Use  . Smoking status: Never Smoker  . Smokeless tobacco: Current User    Types: Chew  Substance and Sexual Activity  . Alcohol use: No    Alcohol/week: 0.0 standard drinks  . Drug use: No  . Sexual activity: Not on file  Other Topics Concern  . Not on file  Social History Narrative  . Not on file   Social Determinants of Health   Financial Resource Strain:   . Difficulty of Paying Living Expenses: Not on file  Food Insecurity:   .  Worried About Charity fundraiser in the Last Year: Not on file  . Ran Out of Food in the Last Year: Not on file  Transportation Needs:   . Lack of Transportation (Medical): Not on file  . Lack of Transportation (Non-Medical): Not on file  Physical Activity:   . Days of Exercise per Week: Not on file  . Minutes of Exercise per Session: Not on file  Stress:   . Feeling of Stress : Not on file  Social Connections:   . Frequency of Communication with Friends and Family: Not on file  . Frequency of Social Gatherings with Friends and Family: Not on file  . Attends Religious Services: Not on file  . Active Member of Clubs or Organizations: Not on file  . Attends Archivist Meetings: Not on file  . Marital Status: Not on file  Intimate Partner Violence:   . Fear of Current or Ex-Partner: Not on file  . Emotionally  Abused: Not on file  . Physically Abused: Not on file  . Sexually Abused: Not on file   Review of Systems No fever No pain or blood in urine No muscular pain    Objective:   Physical Exam  Constitutional: He appears well-developed. No distress.  Neurological:  Abnormal sensation in left L2 dermatome but no lesions           Assessment & Plan:

## 2019-07-01 NOTE — Assessment & Plan Note (Signed)
Recurrent presumed zoster without lesions Doubt disk disease since no injury, sudden and recurrent in the same spot Pain is some better now--will just use valacyclovir shingrix when things settle down

## 2019-07-02 ENCOUNTER — Ambulatory Visit: Payer: BC Managed Care – PPO | Admitting: Internal Medicine

## 2019-07-02 ENCOUNTER — Telehealth: Payer: Self-pay | Admitting: *Deleted

## 2019-07-02 MED ORDER — GABAPENTIN 100 MG PO CAPS: 100.0000 mg | ORAL_CAPSULE | Freq: Three times a day (TID) | ORAL | 0 refills | Status: DC

## 2019-07-02 NOTE — Telephone Encounter (Signed)
Sent in Gabapentin

## 2019-07-02 NOTE — Telephone Encounter (Signed)
Patient left a voicemail stating that he was diagnosed with shingles. Patient stated that he thought the pain had eased up and did not get any pain medication at his visit. Patient stated that the pain has gotten worse and is requesting something for pain.  Dr. Silvio Pate is out of the office this afternoon. Pharmacy [facility]

## 2019-07-02 NOTE — Addendum Note (Signed)
Addended by: Jearld Fenton on: 07/02/2019 03:41 PM   Modules accepted: Orders

## 2019-07-02 NOTE — Telephone Encounter (Signed)
Spoke to pt. Advised him to start with 1 at night for a few nights and then if he needs more to try 1 in the morning. After that he can add another if needed. Rx is written for up to 3 times daily.

## 2019-08-29 ENCOUNTER — Ambulatory Visit: Payer: BC Managed Care – PPO | Attending: Internal Medicine

## 2019-08-29 ENCOUNTER — Other Ambulatory Visit: Payer: Self-pay

## 2019-08-29 DIAGNOSIS — Z23 Encounter for immunization: Secondary | ICD-10-CM | POA: Insufficient documentation

## 2019-08-29 NOTE — Progress Notes (Signed)
   Covid-19 Vaccination Clinic  Name:  Anthony Hunt    MRN: [id number] DOB: 1951-04-08  08/29/2019  [name] was observed post Covid-19 immunization for 15 minutes without incidence. He was provided with Vaccine Information Sheet and instruction to access the V-Safe system.   [name] was instructed to call 911 with any severe reactions post vaccine: Marland Kitchen Difficulty breathing  . Swelling of your face and throat  . A fast heartbeat  . A bad rash all over your body  . Dizziness and weakness    Immunizations Administered    Name Date Dose VIS Date Route   Pfizer COVID-19 Vaccine 08/29/2019  8:33 AM 0.3 mL 06/28/2019 Intramuscular   Manufacturer: [place]   Lot: [id number]   [place]: [id number]

## 2019-09-19 ENCOUNTER — Ambulatory Visit: Payer: BC Managed Care – PPO | Attending: Internal Medicine

## 2019-09-19 DIAGNOSIS — Z23 Encounter for immunization: Secondary | ICD-10-CM | POA: Insufficient documentation

## 2019-09-19 NOTE — Progress Notes (Signed)
   Covid-19 Vaccination Clinic  Name:  Anthony Hunt    MRN: [id number] DOB: Sep 25, 1950  09/19/2019  Anthony Hunt was observed post Covid-19 immunization for 15 minutes without incident. He was provided with Vaccine Information Sheet and instruction to access the V-Safe system.   Anthony Hunt was instructed to call 911 with any severe reactions post vaccine: Marland Kitchen Difficulty breathing  . Swelling of face and throat  . A fast heartbeat  . A bad rash all over body  . Dizziness and weakness

## 2019-09-19 NOTE — Progress Notes (Signed)
   Covid-19 Vaccination Clinic  Name:  Anthony Hunt    MRN: [id number] DOB: Aug 18, 1950  09/19/2019  [name] was observed post Covid-19 immunization for 15 minutes without incident. He was provided with Vaccine Information Sheet and instruction to access the V-Safe system.   [name] was instructed to call 911 with any severe reactions post vaccine: Marland Kitchen Difficulty breathing  . Swelling of face and throat  . A fast heartbeat  . A bad rash all over body  . Dizziness and weakness

## 2019-09-25 ENCOUNTER — Ambulatory Visit: Payer: BC Managed Care – PPO

## 2019-10-15 DIAGNOSIS — G4733 Obstructive sleep apnea (adult) (pediatric): Secondary | ICD-10-CM | POA: Diagnosis not present

## 2019-11-06 LAB — HM DIABETES EYE EXAM

## 2019-11-20 ENCOUNTER — Other Ambulatory Visit: Payer: Self-pay | Admitting: Internal Medicine

## 2019-11-21 ENCOUNTER — Other Ambulatory Visit: Payer: Self-pay | Admitting: Internal Medicine

## 2019-11-22 ENCOUNTER — Encounter: Payer: Self-pay | Admitting: Internal Medicine

## 2019-11-22 ENCOUNTER — Other Ambulatory Visit: Payer: Self-pay

## 2019-11-22 ENCOUNTER — Ambulatory Visit (INDEPENDENT_AMBULATORY_CARE_PROVIDER_SITE_OTHER): Payer: BC Managed Care – PPO | Admitting: Internal Medicine

## 2019-11-22 VITALS — BP 126/78 | HR 58 | Temp 96.9°F | Ht 67.75 in | Wt 222.0 lb

## 2019-11-22 DIAGNOSIS — G4733 Obstructive sleep apnea (adult) (pediatric): Secondary | ICD-10-CM

## 2019-11-22 DIAGNOSIS — D696 Thrombocytopenia, unspecified: Secondary | ICD-10-CM

## 2019-11-22 DIAGNOSIS — E1159 Type 2 diabetes mellitus with other circulatory complications: Secondary | ICD-10-CM

## 2019-11-22 DIAGNOSIS — Z23 Encounter for immunization: Secondary | ICD-10-CM

## 2019-11-22 DIAGNOSIS — I2510 Atherosclerotic heart disease of native coronary artery without angina pectoris: Secondary | ICD-10-CM | POA: Diagnosis not present

## 2019-11-22 DIAGNOSIS — Z0000 Encounter for general adult medical examination without abnormal findings: Secondary | ICD-10-CM | POA: Diagnosis not present

## 2019-11-22 DIAGNOSIS — Z125 Encounter for screening for malignant neoplasm of prostate: Secondary | ICD-10-CM | POA: Diagnosis not present

## 2019-11-22 LAB — CBC
HCT: 48.9 % (ref 39.0–52.0)
Hemoglobin: 17 g/dL (ref 13.0–17.0)
MCHC: 34.7 g/dL (ref 30.0–36.0)
MCV: 98.9 fl (ref 78.0–100.0)
Platelets: 151 10*3/uL (ref 150.0–400.0)
RBC: 4.95 Mil/uL (ref 4.22–5.81)
RDW: 13.1 % (ref 11.5–15.5)
WBC: 7.5 10*3/uL (ref 4.0–10.5)

## 2019-11-22 LAB — HEMOGLOBIN A1C: Hgb A1c MFr Bld: 6.1 % (ref 4.6–6.5)

## 2019-11-22 LAB — COMPREHENSIVE METABOLIC PANEL
ALT: 38 U/L (ref 0–53)
AST: 33 U/L (ref 0–37)
Albumin: 4.5 g/dL (ref 3.5–5.2)
Alkaline Phosphatase: 46 U/L (ref 39–117)
BUN: 24 mg/dL — ABNORMAL HIGH (ref 6–23)
CO2: 30 mEq/L (ref 19–32)
Calcium: 9.7 mg/dL (ref 8.4–10.5)
Chloride: 103 mEq/L (ref 96–112)
Creatinine, Ser: 1.15 mg/dL (ref 0.40–1.50)
GFR: 63.48 mL/min (ref >60.00)
Glucose, Bld: 129 mg/dL — ABNORMAL HIGH (ref 70–99)
Potassium: 4.9 mEq/L (ref 3.5–5.1)
Sodium: 136 mEq/L (ref 135–145)
Total Bilirubin: 0.9 mg/dL (ref 0.2–1.2)
Total Protein: 7.1 g/dL (ref 6.0–8.3)

## 2019-11-22 LAB — LIPID PANEL
Cholesterol: 128 mg/dL (ref 0–200)
HDL: 33.1 mg/dL — ABNORMAL LOW (ref >39.00)
LDL Cholesterol: 66 mg/dL (ref 0–99)
NonHDL: 95.3
Total CHOL/HDL Ratio: 4
Triglycerides: 149 mg/dL (ref 0.0–149.0)
VLDL: 29.8 mg/dL (ref 0.0–40.0)

## 2019-11-22 LAB — HM DIABETES FOOT EXAM

## 2019-11-22 LAB — PSA: PSA: 3.12 ng/mL (ref 0.10–4.00)

## 2019-11-22 NOTE — Patient Instructions (Signed)
Check with your insurance to see what glucose meter is covered (your pharmacist may be able to figure it out).

## 2019-11-22 NOTE — Assessment & Plan Note (Signed)
Has worked on fitness and lost weight Prevnar and shingrix today Flu vaccine in the fall Colon due again in 2023 Discussed PSA---will check

## 2019-11-22 NOTE — Assessment & Plan Note (Signed)
No symptoms On beta blocker, ARB, asa and statin

## 2019-11-22 NOTE — Addendum Note (Signed)
Addended by: Pilar Grammes on: 11/22/2019 10:19 AM   Modules accepted: Orders

## 2019-11-22 NOTE — Progress Notes (Signed)
Subjective:    Patient ID: Anthony Hunt, male    DOB: 1951-04-18, 69 y.o.   MRN: [id number]  HPI Here for physical This visit occurred during the SARS-CoV-2 public health emergency.  Safety protocols were in place, including screening questions prior to the visit, additional usage of staff PPE, and extensive cleaning of exam room while observing appropriate contact time as indicated for disinfecting solutions.   Not checking sugars Watching what he eats---has lost some weight Staying active -- mostly at work No foot pain, numbness, sensation changes  Current Outpatient Medications on File Prior to Visit  Medication Sig Dispense Refill  . aspirin 81 MG tablet Take 81 mg by mouth daily.      . benazepril (LOTENSIN) 10 MG tablet TAKE 1 TABLET BY MOUTH EVERY DAY 90 tablet 0  . metFORMIN (GLUCOPHAGE-XR) 500 MG 24 hr tablet TAKE 2 TABLETS BY MOUTH DAILY WITH BREAKFAST. 180 tablet 3  . metoprolol succinate (TOPROL-XL) 25 MG 24 hr tablet TAKE 1 TABLET BY MOUTH EVERY DAY 90 tablet 3  . simvastatin (ZOCOR) 80 MG tablet TAKE 1 TABLET BY MOUTH EVERYDAY AT BEDTIME 90 tablet 0   No current facility-administered medications on file prior to visit.    No Known Allergies  Past Medical History:  Diagnosis Date  . Allergy   . Bright's disease    age 49-6  no issues since   . CAD (coronary artery disease) 2005   MI  . Hyperlipidemia   . Hypertension   . Myocardial infarction ([place]) 2005  . Pre-diabetes   . Sleep apnea    Uses Cpap    Past Surgical History:  Procedure Laterality Date  . CERVICAL DISC SURGERY    . CORONARY STENT PLACEMENT  2005  . ULNAR NERVE REPAIR     Left ulnar nerve decompression 02/07  . US ECHOCARDIOGRAPHY      mild inf hypokinesis, EF 55%  09/05    Family History  Problem Relation Age of Onset  . Coronary artery disease Mother   . Peripheral vascular disease Mother   . Cancer Maternal Uncle   . Cancer Paternal Aunt   . Diabetes Neg Hx   . Hypertension  Neg Hx   . Colon cancer Neg Hx   . Colon polyps Neg Hx   . Esophageal cancer Neg Hx   . Rectal cancer Neg Hx   . Stomach cancer Neg Hx     Social History   Socioeconomic History  . Marital status: Married    Spouse name: Not on file  . Number of children: 1  . Years of education: Not on file  . Highest education level: Not on file  Occupational History  . Occupation: Furniture conservator/restorer  Tobacco Use  . Smoking status: Never Smoker  . Smokeless tobacco: Current User    Types: Chew  Substance and Sexual Activity  . Alcohol use: No    Alcohol/week: 0.0 standard drinks  . Drug use: No  . Sexual activity: Not on file  Other Topics Concern  . Not on file  Social History Narrative  . Not on file   Social Determinants of Health   Financial Resource Strain:   . Difficulty of Paying Living Expenses:   Food Insecurity:   . Worried About Charity fundraiser in the Last Year:   . Arboriculturist in the Last Year:   Transportation Needs:   . Film/video editor (Medical):   Marland Kitchen Lack of Transportation (  Non-Medical):   Physical Activity:   . Days of Exercise per Week:   . Minutes of Exercise per Session:   Stress:   . Feeling of Stress :   Social Connections:   . Frequency of Communication with Friends and Family:   . Frequency of Social Gatherings with Friends and Family:   . Attends Religious Services:   . Active Member of Clubs or Organizations:   . Attends Archivist Meetings:   Marland Kitchen Marital Status:   Intimate Partner Violence:   . Fear of Current or Ex-Partner:   . Emotionally Abused:   Marland Kitchen Physically Abused:   . Sexually Abused:    Review of Systems  Constitutional: Negative for fatigue.       Weight is down a bit Wears seat belt  HENT: Negative for dental problem, hearing loss, tinnitus and trouble swallowing.        Keeps up with dentist  Eyes: Negative for visual disturbance.       Recent eye exam benign  Respiratory: Negative for cough, chest  tightness and shortness of breath.   Cardiovascular: Negative for chest pain, palpitations and leg swelling.  Gastrointestinal: Negative for blood in stool and constipation.       Rare heartburn--no meds  Endocrine: Negative for polydipsia and polyuria.  Genitourinary: Negative for difficulty urinating and urgency.       No sexual problems  Musculoskeletal: Negative for arthralgias, back pain and joint swelling.  Skin: Negative for rash.       No suspicious lesions  Allergic/Immunologic: Positive for environmental allergies. Negative for immunocompromised state.       Uses loratadine bid with success (in season)  Neurological: Negative for dizziness, syncope, light-headedness and headaches.  Hematological: Negative for adenopathy. Does not bruise/bleed easily.  Psychiatric/Behavioral: Negative for dysphoric mood and sleep disturbance. The patient is not nervous/anxious.        Objective:   Physical Exam  Constitutional: He appears well-developed. No distress.  HENT:  Head: Normocephalic and atraumatic.  Right Ear: External ear normal.  Left Ear: External ear normal.  Mouth/Throat: Oropharynx is clear and moist. No oropharyngeal exudate.  Eyes: Pupils are equal, round, and reactive to light. Conjunctivae are normal.  Neck: No thyromegaly present.  Cardiovascular: Normal rate, regular rhythm, normal heart sounds and intact distal pulses. Exam reveals no gallop.  No murmur heard. Respiratory: Effort normal and breath sounds normal. No respiratory distress. He has no wheezes. He has no rales.  GI: Soft. There is no abdominal tenderness.  Musculoskeletal:        General: No tenderness or edema.  Lymphadenopathy:    He has no cervical adenopathy.  Neurological:  Normal sensation in feet  Skin: No rash noted. No erythema.  No foot lesions Some benign nevi (recommended routine derm)  Psychiatric: He has a normal mood and affect. His behavior is normal.           Assessment &  Plan:

## 2019-11-22 NOTE — Assessment & Plan Note (Signed)
Asked him to get glucometer Will check A1c Seems okay on just metformin

## 2019-11-22 NOTE — Assessment & Plan Note (Signed)
Uses CPAP every night Really notices how much it helps

## 2019-11-22 NOTE — Assessment & Plan Note (Signed)
No symptoms Will recheck CBC

## 2020-02-18 ENCOUNTER — Other Ambulatory Visit: Payer: Self-pay | Admitting: Internal Medicine

## 2020-06-02 ENCOUNTER — Encounter: Payer: Self-pay | Admitting: Internal Medicine

## 2020-06-02 ENCOUNTER — Ambulatory Visit (INDEPENDENT_AMBULATORY_CARE_PROVIDER_SITE_OTHER)
Admission: RE | Admit: 2020-06-02 | Discharge: 2020-06-02 | Disposition: A | Discharge status: Home / Self Care | Payer: BC Managed Care – PPO | Source: Ambulatory Visit | Attending: Internal Medicine | Admitting: Internal Medicine

## 2020-06-02 ENCOUNTER — Ambulatory Visit (INDEPENDENT_AMBULATORY_CARE_PROVIDER_SITE_OTHER): Payer: BC Managed Care – PPO | Admitting: Internal Medicine

## 2020-06-02 ENCOUNTER — Other Ambulatory Visit: Payer: Self-pay

## 2020-06-02 VITALS — BP 122/74 | HR 55 | Temp 96.9°F | Ht 68.0 in | Wt 232.0 lb

## 2020-06-02 DIAGNOSIS — E6609 Other obesity due to excess calories: Secondary | ICD-10-CM | POA: Diagnosis not present

## 2020-06-02 DIAGNOSIS — Z6835 Body mass index (BMI) 35.0-35.9, adult: Secondary | ICD-10-CM

## 2020-06-02 DIAGNOSIS — M25551 Pain in right hip: Secondary | ICD-10-CM | POA: Insufficient documentation

## 2020-06-02 DIAGNOSIS — Z23 Encounter for immunization: Secondary | ICD-10-CM

## 2020-06-02 DIAGNOSIS — E1159 Type 2 diabetes mellitus with other circulatory complications: Secondary | ICD-10-CM

## 2020-06-02 LAB — POCT GLYCOSYLATED HEMOGLOBIN (HGB A1C): Hemoglobin A1C: 7.7 % — AB (ref 4.0–5.6)

## 2020-06-02 MED ORDER — SEMAGLUTIDE 7 MG PO TABS: 1.0000 | ORAL_TABLET | Freq: Every day | ORAL | 3 refills | Status: DC

## 2020-06-02 MED ORDER — GLUCOSE BLOOD VI STRP: ORAL_STRIP | 4 refills | Status: AC

## 2020-06-02 MED ORDER — ONETOUCH DELICA LANCETS 30G MISC: 1.0000 | Freq: Every day | 4 refills | Status: AC

## 2020-06-02 MED ORDER — SEMAGLUTIDE 3 MG PO TABS: 1.0000 | ORAL_TABLET | Freq: Every day | ORAL | 0 refills | Status: DC

## 2020-06-02 NOTE — Assessment & Plan Note (Addendum)
Doesn't seem to be arthritic--but will check x-ray No bursa tenderness ?sciatica Will set up with Dr Lorelei Pont if persists Okay to use the ibuprofen as he has been---prn

## 2020-06-02 NOTE — Patient Instructions (Signed)
If your hip doesn't improve soon, set up an appointment with the sports medicine doctor here---Dr [place]. Let me know if you have any troubles filling the semaglutide prescription. You need to start checking your sugars---at least a few times weekly

## 2020-06-02 NOTE — Addendum Note (Signed)
Addended by: Pilar Grammes on: 06/02/2020 12:00 PM   Modules accepted: Orders

## 2020-06-02 NOTE — Assessment & Plan Note (Signed)
Lab Results  Component Value Date   HGBA1C 7.7 (A) 06/02/2020   Much worse  He needs to start monitoring sugars Discussed increasing metformin vs adding -glutide---he is willing to try the injections

## 2020-06-02 NOTE — Addendum Note (Signed)
Addended by: Pilar Grammes on: 06/02/2020 12:04 PM   Modules accepted: Orders

## 2020-06-02 NOTE — Progress Notes (Signed)
Subjective:    Patient ID: Anthony Hunt, male    DOB: 11/21/1950, 69 y.o.   MRN: [id number]  HPI Here for follow up of diabetes and other chronic health conditions This visit occurred during the SARS-CoV-2 public health emergency.  Safety protocols were in place, including screening questions prior to the visit, additional usage of staff PPE, and extensive cleaning of exam room while observing appropriate contact time as indicated for disinfecting solutions.   Having trouble with his right hip Feels okay upon awakening---then aching down leg as the day goes on (all the way to foot) No back pain No injury Goes back 1/5-2 months---but worsening Tried ibuprofen when gets bad--and then regularly. This helps at night No leg weakness  Still not checking sugars Takes the metformin daily-- 2 daily No foot numbness or pain  Current Outpatient Medications on File Prior to Visit  Medication Sig Dispense Refill  . aspirin 81 MG tablet Take 81 mg by mouth daily.      . benazepril (LOTENSIN) 10 MG tablet TAKE 1 TABLET BY MOUTH EVERY DAY 90 tablet 3  . metFORMIN (GLUCOPHAGE-XR) 500 MG 24 hr tablet TAKE 2 TABLETS BY MOUTH DAILY WITH BREAKFAST. 180 tablet 3  . metoprolol succinate (TOPROL-XL) 25 MG 24 hr tablet TAKE 1 TABLET BY MOUTH EVERY DAY 90 tablet 3  . simvastatin (ZOCOR) 80 MG tablet TAKE 1 TABLET BY MOUTH EVERYDAY AT BEDTIME 90 tablet 3   No current facility-administered medications on file prior to visit.    No Known Allergies  Past Medical History:  Diagnosis Date  . Allergy   . Bright's disease    age [age]  no issues since   . CAD (coronary artery disease) 2005   MI  . Hyperlipidemia   . Hypertension   . Myocardial infarction ([place]) 2005  . Pre-diabetes   . Sleep apnea    Uses Cpap    Past Surgical History:  Procedure Laterality Date  . CERVICAL DISC SURGERY    . CORONARY STENT PLACEMENT  2005  . ULNAR NERVE REPAIR     Left ulnar nerve decompression 02/07  . US  ECHOCARDIOGRAPHY      mild inf hypokinesis, EF 55%  09/05    Family History  Problem Relation Age of Onset  . Coronary artery disease Mother   . Peripheral vascular disease Mother   . Cancer Maternal Uncle   . Cancer Paternal Aunt   . Diabetes Neg Hx   . Hypertension Neg Hx   . Colon cancer Neg Hx   . Colon polyps Neg Hx   . Esophageal cancer Neg Hx   . Rectal cancer Neg Hx   . Stomach cancer Neg Hx     Social History   Socioeconomic History  . Marital status: Married    Spouse name: Not on file  . Number of children: 1  . Years of education: Not on file  . Highest education level: Not on file  Occupational History  . Occupation: Furniture conservator/restorer  Tobacco Use  . Smoking status: Never Smoker  . Smokeless tobacco: Current User    Types: Chew  Vaping Use  . Vaping Use: Never used  Substance and Sexual Activity  . Alcohol use: No    Alcohol/week: 0.0 standard drinks  . Drug use: No  . Sexual activity: Not on file  Other Topics Concern  . Not on file  Social History Narrative  . Not on file   Social Determinants of Health  Financial Resource Strain:   . Difficulty of Paying Living Expenses: Not on file  Food Insecurity:   . Worried About Charity fundraiser in the Last Year: Not on file  . Ran Out of Food in the Last Year: Not on file  Transportation Needs:   . Lack of Transportation (Medical): Not on file  . Lack of Transportation (Non-Medical): Not on file  Physical Activity:   . Days of Exercise per Week: Not on file  . Minutes of Exercise per Session: Not on file  Stress:   . Feeling of Stress : Not on file  Social Connections:   . Frequency of Communication with Friends and Family: Not on file  . Frequency of Social Gatherings with Friends and Family: Not on file  . Attends Religious Services: Not on file  . Active Member of Clubs or Organizations: Not on file  . Attends Archivist Meetings: Not on file  . Marital Status: Not on file    Intimate Partner Violence:   . Fear of Current or Ex-Partner: Not on file  . Emotionally Abused: Not on file  . Physically Abused: Not on file  . Sexually Abused: Not on file   Review of Systems Gained 10#---wasn't aware No chest pain or SOB Sleeps okay--uses the CPAP every night    Objective:   Physical Exam Constitutional:      Appearance: Normal appearance.  Cardiovascular:     Rate and Rhythm: Normal rate and regular rhythm.     Pulses: Normal pulses.     Heart sounds: No murmur heard.  No gallop.   Pulmonary:     Effort: Pulmonary effort is normal.     Breath sounds: Normal breath sounds. No wheezing or rales.  Musculoskeletal:     Cervical back: Neck supple.     Comments: Fairly normal back flexion No back tenderness No bursa tenderness Right hip ROM is fairly normal SLR basically negative  Lymphadenopathy:     Cervical: No cervical adenopathy.  Neurological:     Mental Status: He is alert.     Comments: Normal gait No leg weakness  Psychiatric:        Mood and Affect: Mood normal.        Behavior: Behavior normal.            Assessment & Plan:

## 2020-06-02 NOTE — Assessment & Plan Note (Signed)
BMI ~35 Will start the semaglutide if approved by insurance

## 2020-06-12 ENCOUNTER — Encounter (HOSPITAL_BASED_OUTPATIENT_CLINIC_OR_DEPARTMENT_OTHER): Payer: Self-pay

## 2020-06-12 ENCOUNTER — Emergency Department (HOSPITAL_BASED_OUTPATIENT_CLINIC_OR_DEPARTMENT_OTHER): Payer: BC Managed Care – PPO

## 2020-06-12 ENCOUNTER — Emergency Department (HOSPITAL_BASED_OUTPATIENT_CLINIC_OR_DEPARTMENT_OTHER)
Admission: EM | Admit: 2020-06-12 | Discharge: 2020-06-12 | Disposition: A | Discharge status: Home / Self Care | Payer: BC Managed Care – PPO | Attending: Emergency Medicine | Admitting: Emergency Medicine

## 2020-06-12 ENCOUNTER — Other Ambulatory Visit: Payer: Self-pay

## 2020-06-12 DIAGNOSIS — E119 Type 2 diabetes mellitus without complications: Secondary | ICD-10-CM | POA: Diagnosis not present

## 2020-06-12 DIAGNOSIS — R109 Unspecified abdominal pain: Secondary | ICD-10-CM | POA: Diagnosis not present

## 2020-06-12 DIAGNOSIS — F17220 Nicotine dependence, chewing tobacco, uncomplicated: Secondary | ICD-10-CM | POA: Diagnosis not present

## 2020-06-12 DIAGNOSIS — I10 Essential (primary) hypertension: Secondary | ICD-10-CM | POA: Diagnosis not present

## 2020-06-12 DIAGNOSIS — N3000 Acute cystitis without hematuria: Secondary | ICD-10-CM | POA: Diagnosis not present

## 2020-06-12 DIAGNOSIS — K5730 Diverticulosis of large intestine without perforation or abscess without bleeding: Secondary | ICD-10-CM | POA: Diagnosis not present

## 2020-06-12 DIAGNOSIS — Z955 Presence of coronary angioplasty implant and graft: Secondary | ICD-10-CM | POA: Diagnosis not present

## 2020-06-12 DIAGNOSIS — N281 Cyst of kidney, acquired: Secondary | ICD-10-CM | POA: Diagnosis not present

## 2020-06-12 DIAGNOSIS — Z7982 Long term (current) use of aspirin: Secondary | ICD-10-CM | POA: Diagnosis not present

## 2020-06-12 DIAGNOSIS — I2510 Atherosclerotic heart disease of native coronary artery without angina pectoris: Secondary | ICD-10-CM | POA: Insufficient documentation

## 2020-06-12 DIAGNOSIS — K7689 Other specified diseases of liver: Secondary | ICD-10-CM | POA: Diagnosis not present

## 2020-06-12 DIAGNOSIS — Z7984 Long term (current) use of oral hypoglycemic drugs: Secondary | ICD-10-CM | POA: Insufficient documentation

## 2020-06-12 DIAGNOSIS — I252 Old myocardial infarction: Secondary | ICD-10-CM | POA: Diagnosis not present

## 2020-06-12 DIAGNOSIS — Z79899 Other long term (current) drug therapy: Secondary | ICD-10-CM | POA: Diagnosis not present

## 2020-06-12 DIAGNOSIS — M4319 Spondylolisthesis, multiple sites in spine: Secondary | ICD-10-CM | POA: Diagnosis not present

## 2020-06-12 LAB — URINALYSIS, MICROSCOPIC (REFLEX)

## 2020-06-12 LAB — URINALYSIS, ROUTINE W REFLEX MICROSCOPIC
Glucose, UA: 100 mg/dL — AB
Ketones, ur: NEGATIVE mg/dL
Nitrite: NEGATIVE
Protein, ur: NEGATIVE mg/dL
Specific Gravity, Urine: >1.03 — ABNORMAL HIGH (ref 1.005–1.030)
pH: 5.5 (ref 5.0–8.0)

## 2020-06-12 LAB — BASIC METABOLIC PANEL
Anion gap: 11 (ref 5–15)
BUN: 27 mg/dL — ABNORMAL HIGH (ref 8–23)
CO2: 21 mmol/L — ABNORMAL LOW (ref 22–32)
Calcium: 8.7 mg/dL — ABNORMAL LOW (ref 8.9–10.3)
Chloride: 101 mmol/L (ref 98–111)
Creatinine, Ser: 1.28 mg/dL — ABNORMAL HIGH (ref 0.61–1.24)
GFR, Estimated: >60 mL/min (ref >60)
Glucose, Bld: 122 mg/dL — ABNORMAL HIGH (ref 70–99)
Potassium: 4.4 mmol/L (ref 3.5–5.1)
Sodium: 133 mmol/L — ABNORMAL LOW (ref 135–145)

## 2020-06-12 LAB — CBC
HCT: 48.6 % (ref 39.0–52.0)
Hemoglobin: 16.8 g/dL (ref 13.0–17.0)
MCH: 34.1 pg — ABNORMAL HIGH (ref 26.0–34.0)
MCHC: 34.6 g/dL (ref 30.0–36.0)
MCV: 98.8 fL (ref 80.0–100.0)
Platelets: 141 10*3/uL — ABNORMAL LOW (ref 150–400)
RBC: 4.92 MIL/uL (ref 4.22–5.81)
RDW: 12.5 % (ref 11.5–15.5)
WBC: 16 10*3/uL — ABNORMAL HIGH (ref 4.0–10.5)
nRBC: 0 % (ref 0.0–0.2)

## 2020-06-12 MED ORDER — SODIUM CHLORIDE 0.9 % IV SOLN: INTRAVENOUS | Status: DC

## 2020-06-12 MED ORDER — SODIUM CHLORIDE 0.9 % IV SOLN
1.0000 g | Freq: Once | INTRAVENOUS | Status: AC
  Administered 2020-06-12: 1 g via INTRAVENOUS
  Filled 2020-06-12: qty 10

## 2020-06-12 MED ORDER — SODIUM CHLORIDE 0.9 % IV BOLUS
1000.0000 mL | Freq: Once | INTRAVENOUS | Status: AC
  Administered 2020-06-12: 1000 mL via INTRAVENOUS

## 2020-06-12 MED ORDER — PHENAZOPYRIDINE HCL 100 MG PO TABS
200.0000 mg | ORAL_TABLET | Freq: Once | ORAL | Status: AC
  Administered 2020-06-12: 200 mg via ORAL
  Filled 2020-06-12: qty 2

## 2020-06-12 MED ORDER — CEPHALEXIN 500 MG PO CAPS: 500.0000 mg | ORAL_CAPSULE | Freq: Four times a day (QID) | ORAL | 0 refills | Status: DC

## 2020-06-12 MED ORDER — ONDANSETRON HCL 4 MG/2ML IJ SOLN
4.0000 mg | Freq: Once | INTRAMUSCULAR | Status: DC
  Filled 2020-06-12: qty 2

## 2020-06-12 MED ORDER — PHENAZOPYRIDINE HCL 200 MG PO TABS: 200.0000 mg | ORAL_TABLET | Freq: Three times a day (TID) | ORAL | 0 refills | Status: DC

## 2020-06-12 NOTE — ED Triage Notes (Signed)
Pt reports R sided flank pain. Pt reports frequency and burning with urination for a couple of days. Pt states when he does urinate it is very little. Pt denies blood in urine or n/v.

## 2020-06-12 NOTE — ED Provider Notes (Signed)
[facility] Provider Note   CSN: [id number] Arrival date & time: 06/12/20  1049     History Chief Complaint  Patient presents with   Flank Pain    Anthony Hunt is a 69 y.o. male.  Pt presents to the ED today with right sided flank pain and dysuria/frequency.  He has had sx for a few days.  He denies any f/c.  No vomiting, but feels a little nauseous.  No hx of kidney stones.        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   Thrombocytopenia ([place]) 11/19/2018   Obstructive sleep apnea 08/16/2013   Obesity 08/16/2012   Routine general medical examination at a health care facility 05/19/2011   Hyperlipemia 03/28/2007   Coronary atherosclerosis of native coronary artery 03/27/2007   ALLERGIC RHINITIS 03/27/2007    Past Surgical History:  Procedure Laterality Date   CERVICAL DISC SURGERY     CORONARY STENT PLACEMENT  2005   ULNAR NERVE REPAIR     Left ulnar nerve decompression 02/07   US ECHOCARDIOGRAPHY      mild inf hypokinesis, EF 55%  09/05       Family History  Problem Relation Age of Onset   Coronary artery disease Mother    Peripheral vascular disease Mother    Cancer Maternal Uncle    Cancer Paternal Aunt    Diabetes Neg Hx    Hypertension Neg Hx    Colon cancer Neg Hx    Colon polyps Neg Hx    Esophageal cancer Neg Hx    Rectal cancer Neg Hx    Stomach cancer Neg Hx     Social History   Tobacco Use   Smoking status: Never Smoker   Smokeless tobacco: Current User    Types: Chew  Vaping Use   Vaping Use:  Never used  Substance Use Topics   Alcohol use: No    Alcohol/week: 0.0 standard drinks   Drug use: No    Home Medications Prior to Admission medications   Medication Sig Start Date End Date Taking? Authorizing Provider  aspirin 81 MG tablet Take 81 mg by mouth daily.      [provider]  benazepril (LOTENSIN) 10 MG tablet TAKE 1 TABLET BY MOUTH EVERY DAY 02/18/20   Venia Carbon, MD  cephALEXin (KEFLEX) 500 MG capsule Take 1 capsule (500 mg total) by mouth 4 (four) times daily. 06/12/20   Isla Pence, MD  glucose blood test strip Use to check blood sugar once a day. Dx Code E11.59 06/02/20   Viviana Simpler I, MD  metFORMIN (GLUCOPHAGE-XR) 500 MG 24 hr tablet TAKE 2 TABLETS BY MOUTH DAILY WITH BREAKFAST. 11/20/19   Viviana Simpler I, MD  metoprolol succinate (TOPROL-XL) 25 MG 24 hr tablet TAKE 1 TABLET BY MOUTH EVERY DAY 11/20/19   Venia Carbon, MD  OneTouch Delica Lancets 09O MISC 1 each by Does not apply route daily. Use to check blood sugar daily. Dx Code  E11.59 06/02/20   Venia Carbon, MD  phenazopyridine (PYRIDIUM) 200 MG tablet Take 1 tablet (200 mg total) by mouth 3 (three) times daily. 06/12/20   Isla Pence, MD  Semaglutide 3 MG TABS Take 1 tablet by mouth daily. For 1 month 06/02/20   Venia Carbon, MD  Semaglutide 7 MG TABS Take 1 tablet by mouth daily. Start in 1 month 06/02/20   Venia Carbon, MD  simvastatin (ZOCOR) 80 MG tablet TAKE 1 TABLET BY MOUTH EVERYDAY AT BEDTIME 02/18/20   Venia Carbon, MD    Allergies    Patient has no known allergies.  Review of Systems   Review of Systems  Genitourinary: Positive for decreased urine volume, dysuria and frequency.  All other systems reviewed and are negative.   Physical Exam Updated Vital Signs BP (!) 141/66 (BP Location: Right Arm)    Pulse 70    Temp 98.7 F (37.1 C) (Oral)    Resp 18    Ht 5\' 9"  (1.753 m)    Wt 104.3 kg    SpO2 94%    BMI 33.97 kg/m   Physical Exam Vitals and  nursing note reviewed.  Constitutional:      Appearance: Normal appearance.  HENT:     Head: Normocephalic and atraumatic.     Right Ear: External ear normal.     Left Ear: External ear normal.     Nose: Nose normal.     Mouth/Throat:     Mouth: Mucous membranes are moist.     Pharynx: Oropharynx is clear.  Eyes:     Extraocular Movements: Extraocular movements intact.     Conjunctiva/sclera: Conjunctivae normal.     Pupils: Pupils are equal, round, and reactive to light.  Cardiovascular:     Rate and Rhythm: Normal rate and regular rhythm.     Pulses: Normal pulses.     Heart sounds: Normal heart sounds.  Pulmonary:     Effort: Pulmonary effort is normal.     Breath sounds: Normal breath sounds.  Abdominal:     General: Abdomen is flat. Bowel sounds are normal.     Palpations: Abdomen is soft.  Musculoskeletal:        General: Normal range of motion.     Cervical back: Normal range of motion and neck supple.  Skin:    General: Skin is warm.     Capillary Refill: Capillary refill takes less than 2 seconds.  Neurological:     General: No focal deficit present.     Mental Status: He is alert and oriented to person, place, and time.  Psychiatric:        Mood and Affect: Mood normal.        Behavior: Behavior normal.     ED Results / Procedures / Treatments   Labs (all labs ordered are listed, but only abnormal results are displayed) Labs Reviewed  URINALYSIS, ROUTINE W REFLEX MICROSCOPIC - Abnormal; Notable for the following components:      Result Value   Color, Urine ORANGE (*)    APPearance HAZY (*)    Specific Gravity, Urine >1.030 (*)    Glucose, UA 100 (*)    Hgb urine dipstick MODERATE (*)    Bilirubin Urine SMALL (*)    Leukocytes,Ua SMALL (*)    All other components within normal limits  CBC - Abnormal; Notable for the following components:   WBC 16.0 (*)    MCH 34.1 (*)    Platelets  141 (*)    All other components within normal limits  BASIC METABOLIC  PANEL - Abnormal; Notable for the following components:   Sodium 133 (*)    CO2 21 (*)    Glucose, Bld 122 (*)    BUN 27 (*)    Creatinine, Ser 1.28 (*)    Calcium 8.7 (*)    All other components within normal limits  URINALYSIS, MICROSCOPIC (REFLEX) - Abnormal; Notable for the following components:   Bacteria, UA MANY (*)    All other components within normal limits  URINE CULTURE    EKG None  Radiology CT RENAL STONE STUDY  Result Date: 06/12/2020 CLINICAL DATA:  Right flank pain for 3 days EXAM: CT ABDOMEN AND PELVIS WITHOUT CONTRAST TECHNIQUE: Multidetector CT imaging of the abdomen and pelvis was performed following the standard protocol without IV contrast. COMPARISON:  None. FINDINGS: Lower chest: No acute abnormality. Hepatobiliary: Mildly decreased attenuation of the hepatic parenchyma. No focal liver lesion is identified. Unremarkable gallbladder. No hyperdense gallstone. No biliary dilatation. Pancreas: Unremarkable. No pancreatic ductal dilatation or surrounding inflammatory changes. Spleen: Normal in size without focal abnormality. Adrenals/Urinary Tract: Unremarkable adrenal glands. 3.0 cm cyst in the interpolar region of the right kidney. 1.5 cm cyst in the interpolar region of the left kidney. No renal stone or hydronephrosis. Mild nonspecific bilateral perinephric stranding. Bilateral ureters are unremarkable. Urinary bladder within normal limits. Stomach/Bowel: Stomach is within normal limits. Appendix appears normal (series 2, image 56). Diverticulosis throughout the colon. No evidence of bowel wall thickening, distention, or inflammatory changes. Vascular/Lymphatic: Scattered aortoiliac atherosclerotic calcifications without aneurysm. No abdominopelvic lymphadenopathy. Reproductive: Mildly enlarged prostate gland. Other: No free fluid. No abdominopelvic fluid collection. No pneumoperitoneum. Two nonspecific coarse calcifications within the pelvis. No abdominal wall hernia.  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 Trace retrolisthesis L3 on L4. No acute osseous findings. IMPRESSION: 1. No acute abdominopelvic findings. Specifically, no evidence of obstructive uropathy. 2. Colonic diverticulosis without evidence of acute diverticulitis. 3. Hepatic steatosis. 4. 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 5. Aortic atherosclerosis. (ICD10-I70.0). Electronically Signed   By: Davina Poke D.O.   On: 06/12/2020 13:06    Procedures Procedures (including critical care time)  Medications Ordered in ED Medications  sodium chloride 0.9 % bolus 1,000 mL (1,000 mLs Intravenous New Bag/Given 06/12/20 1324)    And  0.9 %  sodium chloride infusion (has no administration in time range)  ondansetron (ZOFRAN) injection 4 mg (4 mg Intravenous Refused 06/12/20 1325)  cefTRIAXone (ROCEPHIN) 1 g in sodium chloride 0.9 % 100 mL IVPB (has no administration in time range)  phenazopyridine (PYRIDIUM) tablet 200 mg (has no administration in time range)    ED Course  I have reviewed the triage vital signs and the nursing notes.  Pertinent labs & imaging results that were available during my care of the patient were reviewed by me and considered in my medical decision making (see chart for details).    MDM Rules/Calculators/A&P                          Pt is feeling better after fluids.  No evidence of kidney stone or pyelonephritis.  He is given a dose of rocephin and d/c.  He is instructed to return if worse.  F/u with pcp.  Final Clinical Impression(s) / ED Diagnoses Final diagnoses:  Acute cystitis  without hematuria    Rx / DC Orders ED Discharge Orders         Ordered    cephALEXin (KEFLEX) 500 MG capsule  4 times daily        06/12/20 1433    phenazopyridine (PYRIDIUM) 200 MG tablet  3 times daily        06/12/20 1433           Isla Pence,  MD 06/12/20 1435

## 2020-06-14 LAB — URINE CULTURE: Culture: 50000 — AB

## 2020-06-15 ENCOUNTER — Telehealth: Payer: Self-pay | Admitting: *Deleted

## 2020-06-15 NOTE — Telephone Encounter (Signed)
Post ED Visit - Positive Culture Follow-up: Unsuccessful Patient Follow-up  Culture assessed and recommendations reviewed by:  []  Elenor Quinones, Pharm.D. []  Heide Guile, Pharm.D., BCPS AQ-ID []  Parks Neptune, Pharm.D., BCPS []  Alycia Rossetti, Pharm.D., BCPS []  Godfrey, Pharm.D., BCPS, AAHIVP []  Legrand Como, Pharm.D., BCPS, AAHIVP []  Wynell Balloon, PharmD []  Vincenza Hews, PharmD, BCPS  Positive urine culture  []  Patient discharged without antimicrobial prescription and treatment is now indicated [x]  Organism is resistant to prescribed ED discharge antimicrobial []  Patient with positive blood cultures  Plan:  Stop Cephalexin and start Ampicillin 250mg  po QID, #28,  Domenic Moras, PA-C  Unable to contact patient after 3 attempts, letter will be sent to address on file  Ardeen Fillers 06/15/2020, 10:34 AM

## 2020-06-17 ENCOUNTER — Other Ambulatory Visit: Payer: Self-pay

## 2020-06-17 ENCOUNTER — Ambulatory Visit: Payer: BC Managed Care – PPO | Admitting: Internal Medicine

## 2020-06-17 ENCOUNTER — Encounter: Payer: Self-pay | Admitting: Internal Medicine

## 2020-06-17 DIAGNOSIS — N390 Urinary tract infection, site not specified: Secondary | ICD-10-CM | POA: Diagnosis not present

## 2020-06-17 DIAGNOSIS — R319 Hematuria, unspecified: Secondary | ICD-10-CM | POA: Diagnosis not present

## 2020-06-17 MED ORDER — AMOXICILLIN 500 MG PO CAPS: 1000.0000 mg | ORAL_CAPSULE | Freq: Two times a day (BID) | ORAL | 0 refills | Status: AC

## 2020-06-17 NOTE — Assessment & Plan Note (Signed)
Better but still symptoms---probably reflecting the Enterococcus and Rx with cephalosporin CT was reassuring--but probably needs urology and cysto May need contrasted scan as well  Will change to amoxil Urology evaluation

## 2020-06-17 NOTE — Progress Notes (Signed)
Subjective:    Patient ID: Anthony Hunt, male    DOB: 04-05-1951, 69 y.o.   MRN: [id number]  HPI Here for ER follow up This visit occurred during the SARS-CoV-2 public health emergency.  Safety protocols were in place, including screening questions prior to the visit, additional usage of staff PPE, and extensive cleaning of exam room while observing appropriate contact time as indicated for disinfecting solutions.   Started having difficulty going to the bathroom Some back pain---"my regular back pain" Urgency and "hurt like hell"----and not much voiding No visible blood No fever  Started 1 week ago---to ER 2 days later Urinalysis showed microscopic blood and leuks  CT stone study benign Started on cephalexin after rocephin  Now his urine is almost clear Still has some dysuria--but much better  Volume has picked up  Current Outpatient Medications on File Prior to Visit  Medication Sig Dispense Refill  . aspirin 81 MG tablet Take 81 mg by mouth daily.      . benazepril (LOTENSIN) 10 MG tablet TAKE 1 TABLET BY MOUTH EVERY DAY 90 tablet 3  . cephALEXin (KEFLEX) 500 MG capsule Take 1 capsule (500 mg total) by mouth 4 (four) times daily. 28 capsule 0  . glucose blood test strip Use to check blood sugar once a day. Dx Code E11.59 100 each 4  . metFORMIN (GLUCOPHAGE-XR) 500 MG 24 hr tablet TAKE 2 TABLETS BY MOUTH DAILY WITH BREAKFAST. 180 tablet 3  . metoprolol succinate (TOPROL-XL) 25 MG 24 hr tablet TAKE 1 TABLET BY MOUTH EVERY DAY 90 tablet 3  . OneTouch Delica Lancets 74M MISC 1 each by Does not apply route daily. Use to check blood sugar daily. Dx Code E11.59 100 each 4  . Semaglutide 3 MG TABS Take 1 tablet by mouth daily. For 1 month 30 tablet 0  . Semaglutide 7 MG TABS Take 1 tablet by mouth daily. Start in 1 month 90 tablet 3  . simvastatin (ZOCOR) 80 MG tablet TAKE 1 TABLET BY MOUTH EVERYDAY AT BEDTIME 90 tablet 3   No current facility-administered medications on file  prior to visit.    No Known Allergies  Past Medical History:  Diagnosis Date  . Allergy   . Bright's disease    age 44-6  no issues since   . CAD (coronary artery disease) 2005   MI  . Hyperlipidemia   . Hypertension   . Myocardial infarction ([place]) 2005  . Pre-diabetes   . Sleep apnea    Uses Cpap    Past Surgical History:  Procedure Laterality Date  . CERVICAL DISC SURGERY    . CORONARY STENT PLACEMENT  2005  . ULNAR NERVE REPAIR     Left ulnar nerve decompression 02/07  . US ECHOCARDIOGRAPHY      mild inf hypokinesis, EF 55%  09/05    Family History  Problem Relation Age of Onset  . Coronary artery disease Mother   . Peripheral vascular disease Mother   . Cancer Maternal Uncle   . Cancer Paternal Aunt   . Diabetes Neg Hx   . Hypertension Neg Hx   . Colon cancer Neg Hx   . Colon polyps Neg Hx   . Esophageal cancer Neg Hx   . Rectal cancer Neg Hx   . Stomach cancer Neg Hx     Social History   Socioeconomic History  . Marital status: Married    Spouse name: Not on file  . Number of children: 1  .  Years of education: Not on file  . Highest education level: Not on file  Occupational History  . Occupation: Furniture conservator/restorer  Tobacco Use  . Smoking status: Never Smoker  . Smokeless tobacco: Current User    Types: Chew  Vaping Use  . Vaping Use: Never used  Substance and Sexual Activity  . Alcohol use: No    Alcohol/week: 0.0 standard drinks  . Drug use: No  . Sexual activity: Not on file  Other Topics Concern  . Not on file  Social History Narrative  . Not on file   Social Determinants of Health   Financial Resource Strain:   . Difficulty of Paying Living Expenses: Not on file  Food Insecurity:   . Worried About Charity fundraiser in the Last Year: Not on file  . Ran Out of Food in the Last Year: Not on file  Transportation Needs:   . Lack of Transportation (Medical): Not on file  . Lack of Transportation (Non-Medical): Not on file  Physical  Activity:   . Days of Exercise per Week: Not on file  . Minutes of Exercise per Session: Not on file  Stress:   . Feeling of Stress : Not on file  Social Connections:   . Frequency of Communication with Friends and Family: Not on file  . Frequency of Social Gatherings with Friends and Family: Not on file  . Attends Religious Services: Not on file  . Active Member of Clubs or Organizations: Not on file  . Attends Archivist Meetings: Not on file  . Marital Status: Not on file  Intimate Partner Violence:   . Fear of Current or Ex-Partner: Not on file  . Emotionally Abused: Not on file  . Physically Abused: Not on file  . Sexually Abused: Not on file   Review of Systems No N/V Eating okay    Objective:   Physical Exam Constitutional:      Appearance: Normal appearance.  Abdominal:     Palpations: Abdomen is soft.     Tenderness: There is no abdominal tenderness. There is no right CVA tenderness or left CVA tenderness.  Neurological:     Mental Status: He is alert.            Assessment & Plan:

## 2020-06-22 ENCOUNTER — Encounter: Payer: Self-pay | Admitting: Urology

## 2020-06-22 ENCOUNTER — Ambulatory Visit: Payer: BC Managed Care – PPO | Admitting: Urology

## 2020-06-22 ENCOUNTER — Other Ambulatory Visit: Payer: Self-pay

## 2020-06-22 VITALS — BP 138/70 | HR 59 | Ht 68.0 in | Wt 221.0 lb

## 2020-06-22 DIAGNOSIS — R319 Hematuria, unspecified: Secondary | ICD-10-CM | POA: Diagnosis not present

## 2020-06-22 DIAGNOSIS — N390 Urinary tract infection, site not specified: Secondary | ICD-10-CM

## 2020-06-22 DIAGNOSIS — N138 Other obstructive and reflux uropathy: Secondary | ICD-10-CM

## 2020-06-22 DIAGNOSIS — N401 Enlarged prostate with lower urinary tract symptoms: Secondary | ICD-10-CM

## 2020-06-22 LAB — URINALYSIS, COMPLETE
Bilirubin, UA: NEGATIVE
Glucose, UA: NEGATIVE
Ketones, UA: NEGATIVE
Leukocytes,UA: NEGATIVE
Nitrite, UA: NEGATIVE
Protein,UA: NEGATIVE
RBC, UA: NEGATIVE
Specific Gravity, UA: 1.01 (ref 1.005–1.030)
Urobilinogen, Ur: 1 mg/dL (ref 0.2–1.0)
pH, UA: 5.5 (ref 5.0–7.5)

## 2020-06-22 LAB — MICROSCOPIC EXAMINATION
Bacteria, UA: NONE SEEN
Epithelial Cells (non renal): NONE SEEN /hpf (ref 0–10)

## 2020-06-22 LAB — BLADDER SCAN AMB NON-IMAGING

## 2020-06-22 NOTE — Patient Instructions (Signed)
Benign Prostatic Hyperplasia  Benign prostatic hyperplasia (BPH) is an enlarged prostate gland that is caused by the normal aging process and not by cancer. The prostate is a walnut-sized gland that is involved in the production of semen. It is located in front of the rectum and below the bladder. The bladder stores urine and the urethra is the tube that carries the urine out of the body. The prostate may get bigger as a man gets older. An enlarged prostate can press on the urethra. This can make it harder to pass urine. The build-up of urine in the bladder can cause infection. Back pressure and infection may progress to bladder damage and kidney (renal) failure. What are the causes? This condition is part of a normal aging process. However, not all men develop problems from this condition. If the prostate enlarges away from the urethra, urine flow will not be blocked. If it enlarges toward the urethra and compresses it, there will be problems passing urine. What increases the risk? This condition is more likely to develop in men over the age of 50 years. What are the signs or symptoms? Symptoms of this condition include:  Getting up often during the night to urinate.  Needing to urinate frequently during the day.  Difficulty starting urine flow.  Decrease in size and strength of your urine stream.  Leaking (dribbling) after urinating.  Inability to pass urine. This needs immediate treatment.  Inability to completely empty your bladder.  Pain when you pass urine. This is more common if there is also an infection.  Urinary tract infection (UTI). How is this diagnosed? This condition is diagnosed based on your medical history, a physical exam, and your symptoms. Tests will also be done, such as:  A post-void bladder scan. This measures any amount of urine that may remain in your bladder after you finish urinating.  A digital rectal exam. In a rectal exam, your health care provider  checks your prostate by putting a lubricated, gloved finger into your rectum to feel the back of your prostate gland. This exam detects the size of your gland and any abnormal lumps or growths.  An exam of your urine (urinalysis).  A prostate specific antigen (PSA) screening. This is a blood test used to screen for prostate cancer.  An ultrasound. This test uses sound waves to electronically produce a picture of your prostate gland. Your health care provider may refer you to a specialist in kidney and prostate diseases (urologist). How is this treated? Once symptoms begin, your health care provider will monitor your condition (active surveillance or watchful waiting). Treatment for this condition will depend on the severity of your condition. Treatment may include:  Observation and yearly exams. This may be the only treatment needed if your condition and symptoms are mild.  Medicines to relieve your symptoms, including: ? Medicines to shrink the prostate. ? Medicines to relax the muscle of the prostate.  Surgery in severe cases. Surgery may include: ? Prostatectomy. In this procedure, the prostate tissue is removed completely through an open incision or with a laparoscope or robotics. ? Transurethral resection of the prostate (TURP). In this procedure, a tool is inserted through the opening at the tip of the penis (urethra). It is used to cut away tissue of the inner core of the prostate. The pieces are removed through the same opening of the penis. This removes the blockage. ? Transurethral incision (TUIP). In this procedure, small cuts are made in the prostate. This lessens   the prostate's pressure on the urethra. ? Transurethral microwave thermotherapy (TUMT). This procedure uses microwaves to create heat. The heat destroys and removes a small amount of prostate tissue. ? Transurethral needle ablation (TUNA). This procedure uses radio frequencies to destroy and remove a small amount of  prostate tissue. ? Interstitial laser coagulation ([place]). This procedure uses a laser to destroy and remove a small amount of prostate tissue. ? Transurethral electrovaporization (TUVP). This procedure uses electrodes to destroy and remove a small amount of prostate tissue. ? Prostatic urethral lift. This procedure inserts an implant to push the lobes of the prostate away from the urethra. Follow these instructions at home:  Take over-the-counter and prescription medicines only as told by your health care provider.  Monitor your symptoms for any changes. Contact your health care provider with any changes.  Avoid drinking large amounts of liquid before going to bed or out in public.  Avoid or reduce how much caffeine or alcohol you drink.  Give yourself time when you urinate.  Keep all follow-up visits as told by your health care provider. This is important. Contact a health care provider if:  You have unexplained back pain.  Your symptoms do not get better with treatment.  You develop side effects from the medicine you are taking.  Your urine becomes very dark or has a bad smell.  Your lower abdomen becomes distended and you have trouble passing your urine. Get help right away if:  You have a fever or chills.  You suddenly cannot urinate.  You feel lightheaded, or very dizzy, or you faint.  There are large amounts of blood or clots in the urine.  Your urinary problems become hard to manage.  You develop moderate to severe low back or flank pain. The flank is the side of your body between the ribs and the hip. These symptoms may represent a serious problem that is an emergency. Do not wait to see if the symptoms will go away. Get medical help right away. Call your local emergency services (911 in the [place] Do not drive yourself to the hospital. Summary  Benign prostatic hyperplasia (BPH) is an enlarged prostate that is caused by the normal aging process and not by  cancer.  An enlarged prostate can press on the urethra. This can make it hard to pass urine.  This condition is part of a normal aging process and is more likely to develop in men over the age of 44 years.  Get help right away if you suddenly cannot urinate. This information is not intended to replace advice given to you by your health care provider. Make sure you discuss any questions you have with your health care provider. Document Revised: 05/29/2018 Document Reviewed: 08/08/2016 Elsevier Patient Education  [place]. Urinary Tract Infection, Adult  A urinary tract infection (UTI) is an infection of any part of the urinary tract. The urinary tract includes the kidneys, ureters, bladder, and urethra. These organs make, store, and get rid of urine in the body. Your health care provider may use other names to describe the infection. An upper UTI affects the ureters and kidneys (pyelonephritis). A lower UTI affects the bladder (cystitis) and urethra (urethritis). What are the causes? Most urinary tract infections are caused by bacteria in your genital area, around the entrance to your urinary tract (urethra). These bacteria grow and cause inflammation of your urinary tract. What increases the risk? You are more likely to develop this condition if:  You have  a urinary catheter that stays in place (indwelling).  You are not able to control when you urinate or have a bowel movement (you have incontinence).  You are male and you: ? Use a spermicide or diaphragm for birth control. ? Have low estrogen levels. ? Are pregnant.  You have certain genes that increase your risk (genetics).  You are sexually active.  You take antibiotic medicines.  You have a condition that causes your flow of urine to slow down, such as: ? An enlarged prostate, if you are male. ? Blockage in your urethra (stricture). ? A kidney stone. ? A nerve condition that affects your bladder control  (neurogenic bladder). ? Not getting enough to drink, or not urinating often.  You have certain medical conditions, such as: ? Diabetes. ? A weak disease-fighting system (immunesystem). ? Sickle cell disease. ? Gout. ? Spinal cord injury. What are the signs or symptoms? Symptoms of this condition include:  Needing to urinate right away (urgently).  Frequent urination or passing small amounts of urine frequently.  Pain or burning with urination.  Blood in the urine.  Urine that smells bad or unusual.  Trouble urinating.  Cloudy urine.  Vaginal discharge, if you are male.  Pain in the abdomen or the lower back. You may also have:  Vomiting or a decreased appetite.  Confusion.  Irritability or tiredness.  A fever.  Diarrhea. The first symptom in older adults may be confusion. In some cases, they may not have any symptoms until the infection has worsened. How is this diagnosed? This condition is diagnosed based on your medical history and a physical exam. You may also have other tests, including:  Urine tests.  Blood tests.  Tests for sexually transmitted infections (STIs). If you have had more than one UTI, a cystoscopy or imaging studies may be done to determine the cause of the infections. How is this treated? Treatment for this condition includes:  Antibiotic medicine.  Over-the-counter medicines to treat discomfort.  Drinking enough water to stay hydrated. If you have frequent infections or have other conditions such as a kidney stone, you may need to see a health care provider who specializes in the urinary tract (urologist). In rare cases, urinary tract infections can cause sepsis. Sepsis is a life-threatening condition that occurs when the body responds to an infection. Sepsis is treated in the hospital with IV antibiotics, fluids, and other medicines. Follow these instructions at home:  Medicines  Take over-the-counter and prescription medicines  only as told by your health care provider.  If you were prescribed an antibiotic medicine, take it as told by your health care provider. Do not stop using the antibiotic even if you start to feel better. General instructions  Make sure you: ? Empty your bladder often and completely. Do not hold urine for long periods of time. ? Empty your bladder after sex. ? Wipe from front to back after a bowel movement if you are male. Use each tissue one time when you wipe.  Drink enough fluid to keep your urine pale yellow.  Keep all follow-up visits as told by your health care provider. This is important. Contact a health care provider if:  Your symptoms do not get better after 1-2 days.  Your symptoms go away and then return. Get help right away if you have:  Severe pain in your back or your lower abdomen.  A fever.  Nausea or vomiting. Summary  A urinary tract infection (UTI) is an infection of  any part of the urinary tract, which includes the kidneys, ureters, bladder, and urethra.  Most urinary tract infections are caused by bacteria in your genital area, around the entrance to your urinary tract (urethra).  Treatment for this condition often includes antibiotic medicines.  If you were prescribed an antibiotic medicine, take it as told by your health care provider. Do not stop using the antibiotic even if you start to feel better.  Keep all follow-up visits as told by your health care provider. This is important. This information is not intended to replace advice given to you by your health care provider. Make sure you discuss any questions you have with your health care provider. Document Revised: 06/21/2018 Document Reviewed: 01/11/2018 Elsevier Patient Education  2020 [employer].

## 2020-06-22 NOTE — Progress Notes (Signed)
06/22/20 2:12 PM   Anthony Hunt 08-05-1950 [id number]  CC: UTI, BPH  HPI: I saw Mr. Anthony Hunt in urology clinic today for evaluation of a UTI.  He is a 69 year old male with CAD and diabetes who recently was diagnosed with a culture documented UTI on 06/12/2020 with Enterococcus.  He was treated with culture appropriate amoxicillin with complete resolution in his urinary symptoms.  He originally presented to the ED with right-sided flank pain and dysuria with urinary frequency.  He has no history of kidney stones.  A CT was performed that showed some perinephric stranding, but no hydronephrosis or stone disease, and prostate was significantly enlarged at 115 g.  Urinalysis today is benign with 0-5 WBCs, 0-2 RBCs, no bacteria, nitrate negative.  PVR is normal at 58 mL.  He reports that prior to his UTI he denies any significant urinary symptoms.  He denies any history of gross hematuria, prior UTIs, or urinary retention.  PSA has been normal, and was most recently 3.12 in May 2021.  PMH: Past Medical History:  Diagnosis Date  . Allergy   . Bright's disease    age 39-6  no issues since   . CAD (coronary artery disease) 2005   MI  . Hyperlipidemia   . Hypertension   . Myocardial infarction ([place]) 2005  . Pre-diabetes   . Sleep apnea    Uses Cpap    Surgical History: Past Surgical History:  Procedure Laterality Date  . CERVICAL DISC SURGERY    . CORONARY STENT PLACEMENT  2005  . ULNAR NERVE REPAIR     Left ulnar nerve decompression 02/07  . US ECHOCARDIOGRAPHY      mild inf hypokinesis, EF 55%  09/05    Family History: Family History  Problem Relation Age of Onset  . Coronary artery disease Mother   . Peripheral vascular disease Mother   . Cancer Maternal Uncle   . Cancer Paternal Aunt   . Diabetes Neg Hx   . Hypertension Neg Hx   . Colon cancer Neg Hx   . Colon polyps Neg Hx   . Esophageal cancer Neg Hx   . Rectal cancer Neg Hx   . Stomach cancer Neg Hx   .  Bladder Cancer Neg Hx   . Prostate cancer Neg Hx   . Kidney cancer Neg Hx     Social History:  reports that he has never smoked. His smokeless tobacco use includes chew. He reports that he does not drink alcohol and does not use drugs.  Physical Exam: BP 138/70   Pulse (!) 59   Ht 5\' 8"  (1.727 m)   Wt 221 lb (100.2 kg)   BMI 33.60 kg/m    Constitutional:  Alert and oriented, No acute distress. Cardiovascular: No clubbing, cyanosis, or edema. Respiratory: Normal respiratory effort, no increased work of breathing. GI: Abdomen is soft, nontender, nondistended, no abdominal masses   Laboratory Data: Reviewed, see HPI  Pertinent Imaging: I have personally viewed and interpreted the CT showing no hydronephrosis, perinephric stranding, and a 115 g prostate  Assessment & Plan:   69 year old male with a number of comorbidities who recently was diagnosed with a single UTI, resolved with antibiotics.  Prostate measures 115 g on CT, but no hydronephrosis, and he is emptying his bladder well with a PVR of 58 mL today.  We discussed UTI prevention strategies including control of diabetes, adequate hydration, cranberry tablets.  We discussed that in the future if he is having  worsening urinary symptoms or recurrent urinary tract infections, we sometimes need to consider an outlet procedure like HOLEP.  It sounds like he had only minimal urinary symptoms prior to being diagnosed with a UTI, and I would recommend following up in 6 months for PVR and urinary symptom check.  No indication for Flomax at this time.  RTC 6 months for symptom check, PVR, IPSS  Nickolas Madrid, MD 06/22/2020  [facility] [address], [place], [place] [address] [phone]

## 2020-12-02 ENCOUNTER — Encounter: Payer: Self-pay | Admitting: Internal Medicine

## 2020-12-02 ENCOUNTER — Ambulatory Visit (INDEPENDENT_AMBULATORY_CARE_PROVIDER_SITE_OTHER): Payer: BC Managed Care – PPO | Admitting: Internal Medicine

## 2020-12-02 ENCOUNTER — Other Ambulatory Visit: Payer: Self-pay

## 2020-12-02 VITALS — BP 126/74 | HR 56 | Temp 97.3°F | Ht 67.5 in | Wt 216.0 lb

## 2020-12-02 DIAGNOSIS — E1159 Type 2 diabetes mellitus with other circulatory complications: Secondary | ICD-10-CM

## 2020-12-02 DIAGNOSIS — Z125 Encounter for screening for malignant neoplasm of prostate: Secondary | ICD-10-CM | POA: Diagnosis not present

## 2020-12-02 DIAGNOSIS — G4733 Obstructive sleep apnea (adult) (pediatric): Secondary | ICD-10-CM

## 2020-12-02 DIAGNOSIS — I2510 Atherosclerotic heart disease of native coronary artery without angina pectoris: Secondary | ICD-10-CM

## 2020-12-02 DIAGNOSIS — Z23 Encounter for immunization: Secondary | ICD-10-CM

## 2020-12-02 DIAGNOSIS — Z0000 Encounter for general adult medical examination without abnormal findings: Secondary | ICD-10-CM

## 2020-12-02 DIAGNOSIS — D696 Thrombocytopenia, unspecified: Secondary | ICD-10-CM

## 2020-12-02 LAB — CBC
HCT: 46.6 % (ref 39.0–52.0)
Hemoglobin: 16 g/dL (ref 13.0–17.0)
MCHC: 34.3 g/dL (ref 30.0–36.0)
MCV: 98.2 fl (ref 78.0–100.0)
Platelets: 128 10*3/uL — ABNORMAL LOW (ref 150.0–400.0)
RBC: 4.74 Mil/uL (ref 4.22–5.81)
RDW: 13.7 % (ref 11.5–15.5)
WBC: 6.6 10*3/uL (ref 4.0–10.5)

## 2020-12-02 LAB — COMPREHENSIVE METABOLIC PANEL
ALT: 23 U/L (ref 0–53)
AST: 20 U/L (ref 0–37)
Albumin: 4.2 g/dL (ref 3.5–5.2)
Alkaline Phosphatase: 38 U/L — ABNORMAL LOW (ref 39–117)
BUN: 22 mg/dL (ref 6–23)
CO2: 27 mEq/L (ref 19–32)
Calcium: 9.4 mg/dL (ref 8.4–10.5)
Chloride: 106 mEq/L (ref 96–112)
Creatinine, Ser: 1.12 mg/dL (ref 0.40–1.50)
GFR: 67.85 mL/min (ref >60.00)
Glucose, Bld: 115 mg/dL — ABNORMAL HIGH (ref 70–99)
Potassium: 4.6 mEq/L (ref 3.5–5.1)
Sodium: 141 mEq/L (ref 135–145)
Total Bilirubin: 0.8 mg/dL (ref 0.2–1.2)
Total Protein: 6.5 g/dL (ref 6.0–8.3)

## 2020-12-02 LAB — HM DIABETES FOOT EXAM

## 2020-12-02 LAB — LIPID PANEL
Cholesterol: 131 mg/dL (ref 0–200)
HDL: 40.5 mg/dL (ref >39.00)
LDL Cholesterol: 71 mg/dL (ref 0–99)
NonHDL: 90.9
Total CHOL/HDL Ratio: 3
Triglycerides: 98 mg/dL (ref 0.0–149.0)
VLDL: 19.6 mg/dL (ref 0.0–40.0)

## 2020-12-02 LAB — HEMOGLOBIN A1C: Hgb A1c MFr Bld: 5.7 % (ref 4.6–6.5)

## 2020-12-02 LAB — PSA: PSA: 6.86 ng/mL — ABNORMAL HIGH (ref 0.10–4.00)

## 2020-12-02 NOTE — Progress Notes (Signed)
Subjective:    Patient ID: Anthony Hunt, male    DOB: 29-May-1951, 70 y.o.   MRN: [id number]  HPI Here for physical This visit occurred during the SARS-CoV-2 public health emergency.  Safety protocols were in place, including screening questions prior to the visit, additional usage of staff PPE, and extensive cleaning of exam room while observing appropriate contact time as indicated for disinfecting solutions.   Did see the urologist Going back for follow up next month  Has been taking semaglutide --no problems with this Rarely checks sugars---mostly fasting (usually under 130) No hypoglycemic reactions  Current Outpatient Medications on File Prior to Visit  Medication Sig Dispense Refill  . aspirin 81 MG tablet Take 81 mg by mouth daily.    . benazepril (LOTENSIN) 10 MG tablet TAKE 1 TABLET BY MOUTH EVERY DAY 90 tablet 3  . glucose blood test strip Use to check blood sugar once a day. Dx Code E11.59 100 each 4  . metFORMIN (GLUCOPHAGE-XR) 500 MG 24 hr tablet TAKE 2 TABLETS BY MOUTH DAILY WITH BREAKFAST. 180 tablet 3  . metoprolol succinate (TOPROL-XL) 25 MG 24 hr tablet TAKE 1 TABLET BY MOUTH EVERY DAY 90 tablet 3  . OneTouch Delica Lancets 06C MISC 1 each by Does not apply route daily. Use to check blood sugar daily. Dx Code E11.59 100 each 4  . Semaglutide 7 MG TABS Take 1 tablet by mouth daily. Start in 1 month 90 tablet 3  . simvastatin (ZOCOR) 80 MG tablet TAKE 1 TABLET BY MOUTH EVERYDAY AT BEDTIME 90 tablet 3   No current facility-administered medications on file prior to visit.    No Known Allergies  Past Medical History:  Diagnosis Date  . Allergy   . Bright's disease    age 53-6  no issues since   . CAD (coronary artery disease) 2005   MI  . Hyperlipidemia   . Hypertension   . Myocardial infarction ([place]) 2005  . Pre-diabetes   . Sleep apnea    Uses Cpap    Past Surgical History:  Procedure Laterality Date  . CERVICAL DISC SURGERY    . CORONARY STENT  PLACEMENT  2005  . ULNAR NERVE REPAIR     Left ulnar nerve decompression 02/07  . US ECHOCARDIOGRAPHY      mild inf hypokinesis, EF 55%  09/05    Family History  Problem Relation Age of Onset  . Coronary artery disease Mother   . Peripheral vascular disease Mother   . Cancer Maternal Uncle   . Cancer Paternal Aunt   . Diabetes Neg Hx   . Hypertension Neg Hx   . Colon cancer Neg Hx   . Colon polyps Neg Hx   . Esophageal cancer Neg Hx   . Rectal cancer Neg Hx   . Stomach cancer Neg Hx   . Bladder Cancer Neg Hx   . Prostate cancer Neg Hx   . Kidney cancer Neg Hx     Social History   Socioeconomic History  . Marital status: Married    Spouse name: Not on file  . Number of children: 1  . Years of education: Not on file  . Highest education level: Not on file  Occupational History  . Occupation: Furniture conservator/restorer  Tobacco Use  . Smoking status: Never Smoker  . Smokeless tobacco: Current User    Types: Chew  Vaping Use  . Vaping Use: Never used  Substance and Sexual Activity  . Alcohol use:  No    Alcohol/week: 0.0 standard drinks  . Drug use: No  . Sexual activity: Not on file  Other Topics Concern  . Not on file  Social History Narrative  . Not on file   Social Determinants of Health   Financial Resource Strain: Not on file  Food Insecurity: Not on file  Transportation Needs: Not on file  Physical Activity: Not on file  Stress: Not on file  Social Connections: Not on file  Intimate Partner Violence: Not on file   Review of Systems  Constitutional: Negative for fatigue.       Has lost 20# from usual previous weights  Wears seat belt  HENT: Negative for dental problem, hearing loss and tinnitus.        Keeps up with dentist  Eyes: Negative for visual disturbance.       Has eye appt  Respiratory: Negative for cough, chest tightness and shortness of breath.   Cardiovascular: Negative for chest pain, palpitations and leg swelling.       Some  walking--physically active at times at work  Gastrointestinal: Negative for abdominal pain, blood in stool and constipation.       No heartburn  Endocrine: Negative for polydipsia and polyuria.  Genitourinary: Negative for difficulty urinating and urgency.       Stream is good No sexual problems  Musculoskeletal: Negative for arthralgias, back pain and joint swelling.  Skin: Negative for rash.       No suspicious lesions  Allergic/Immunologic: Positive for environmental allergies. Negative for immunocompromised state.       Uses loratadine--not as helpful (even bid)  Neurological: Negative for dizziness, syncope, light-headedness and headaches.       No foot sensory changes  Hematological: Negative for adenopathy. Does not bruise/bleed easily.  Psychiatric/Behavioral: Negative for dysphoric mood and sleep disturbance. The patient is not nervous/anxious.        Objective:   Physical Exam Constitutional:      Appearance: Normal appearance.  HENT:     Right Ear: Tympanic membrane normal.     Left Ear: Tympanic membrane normal.     Mouth/Throat:     Pharynx: No oropharyngeal exudate or posterior oropharyngeal erythema.  Eyes:     Conjunctiva/sclera: Conjunctivae normal.     Pupils: Pupils are equal, round, and reactive to light.  Cardiovascular:     Rate and Rhythm: Normal rate and regular rhythm.     Pulses: Normal pulses.     Heart sounds: No murmur heard. No gallop.   Pulmonary:     Effort: Pulmonary effort is normal.     Breath sounds: Normal breath sounds. No wheezing or rales.  Abdominal:     Palpations: Abdomen is soft.     Tenderness: There is no abdominal tenderness.  Musculoskeletal:     Cervical back: Neck supple.     Right lower leg: No edema.     Left lower leg: No edema.  Lymphadenopathy:     Cervical: No cervical adenopathy.  Skin:    General: Skin is warm.     Findings: No rash.  Neurological:     General: No focal deficit present.     Mental Status:  He is alert and oriented to person, place, and time.     Comments: Normal sensation in feet  Psychiatric:        Mood and Affect: Mood normal.        Behavior: Behavior normal.  Assessment & Plan:

## 2020-12-02 NOTE — Assessment & Plan Note (Signed)
Uses the CPAP nightly with good results 

## 2020-12-02 NOTE — Assessment & Plan Note (Signed)
Healthy Discussed fitness Colon due 2023 Will check PSA Consider second COVID booster Flu vaccine in the fall

## 2020-12-02 NOTE — Assessment & Plan Note (Signed)
Mild No bleeding

## 2020-12-02 NOTE — Assessment & Plan Note (Signed)
Quiet on beta blocker, ACEI, statin and ASA

## 2020-12-02 NOTE — Assessment & Plan Note (Signed)
Seems to have good control on the semaglutide and metformin If A1c not under 7.5%--consider increasing semaglutide

## 2020-12-02 NOTE — Addendum Note (Signed)
Addended by: Pilar Grammes on: 12/02/2020 09:48 AM   Modules accepted: Orders

## 2020-12-12 ENCOUNTER — Other Ambulatory Visit: Payer: Self-pay | Admitting: Internal Medicine

## 2020-12-23 ENCOUNTER — Encounter: Payer: Self-pay | Admitting: Urology

## 2020-12-23 ENCOUNTER — Ambulatory Visit: Payer: BC Managed Care – PPO | Admitting: Urology

## 2020-12-23 ENCOUNTER — Other Ambulatory Visit: Payer: Self-pay

## 2020-12-23 VITALS — BP 144/74 | HR 61 | Ht 68.0 in | Wt 216.0 lb

## 2020-12-23 DIAGNOSIS — N138 Other obstructive and reflux uropathy: Secondary | ICD-10-CM | POA: Diagnosis not present

## 2020-12-23 DIAGNOSIS — N401 Enlarged prostate with lower urinary tract symptoms: Secondary | ICD-10-CM | POA: Diagnosis not present

## 2020-12-23 DIAGNOSIS — R972 Elevated prostate specific antigen [PSA]: Secondary | ICD-10-CM

## 2020-12-23 LAB — BLADDER SCAN AMB NON-IMAGING

## 2020-12-23 NOTE — Patient Instructions (Signed)
Prostate Cancer Screening  Prostate cancer screening is a test that is done to check for the presence of prostate cancer in men. The prostate gland is a walnut-sized gland that is located below the bladder and in front of the rectum in males. The function of the prostate is to add fluid to semen during ejaculation. Prostate cancer is the second most common type of cancer in men. Who should have prostate cancer screening?  Screening recommendations vary based on age and other risk factors. Screening is recommended if:  You are older than age 55. If you are age 55-69, talk with your health care provider about your need for screening and how often screening should be done. Because most prostate cancers are slow growing and will not cause death, screening is generally reserved in this age group for men who have a 10-15-year life expectancy.  You are younger than age 55, and you have these risk factors: ? Being a black male or a male of African descent. ? Having a father, brother, or uncle who has been diagnosed with prostate cancer. The risk is higher if your family member's cancer occurred at an early age. Screening is not recommended if:  You are younger than age 40.  You are between the ages of 40 and 54 and you have no risk factors.  You are 70 years of age or older. At this age, the risks that screening can cause are greater than the benefits that it may provide. If you are at high risk for prostate cancer, your health care provider may recommend that you have screenings more often or that you start screening at a younger age. How is screening for prostate cancer done? The recommended prostate cancer screening test is a blood test called the prostate-specific antigen (PSA) test. PSA is a protein that is made in the prostate. As you age, your prostate naturally produces more PSA. Abnormally high PSA levels may be caused by:  Prostate cancer.  An enlarged prostate that is not caused by cancer  (benign prostatic hyperplasia, BPH). This condition is very common in older men.  A prostate gland infection (prostatitis). Depending on the PSA results, you may need more tests, such as:  A physical exam to check the size of your prostate gland.  Blood and imaging tests.  A procedure to remove tissue samples from your prostate gland for testing (biopsy). What are the benefits of prostate cancer screening?  Screening can help to identify cancer at an early stage, before symptoms start and when the cancer can be treated more easily.  There is a small chance that screening may lower your risk of dying from prostate cancer. The chance is small because prostate cancer is a slow-growing cancer, and most men with prostate cancer die from a different cause. What are the risks of prostate cancer screening? The main risk of prostate cancer screening is diagnosing and treating prostate cancer that would never have caused any symptoms or problems. This is called overdiagnosisand overtreatment. PSA screening cannot tell you if your PSA is high due to cancer or a different cause. A prostate biopsy is the only procedure to diagnose prostate cancer. Even the results of a biopsy may not tell you if your cancer needs to be treated. Slow-growing prostate cancer may not need any treatment other than monitoring, so diagnosing and treating it may cause unnecessary stress or other side effects. A prostate biopsy may also cause:  Infection or fever.  A false negative. This is   a result that shows that you do not have prostate cancer when you actually do have prostate cancer. Questions to ask your health care provider  When should I start prostate cancer screening?  What is my risk for prostate cancer?  How often do I need screening?  What type of screening tests do I need?  How do I get my test results?  What do my results mean?  Do I need treatment? Where to find more information  The American Cancer  Society: www.cancer.org  American Urological Association: www.auanet.org Contact a health care provider if:  You have difficulty urinating.  You have pain when you urinate or ejaculate.  You have blood in your urine or semen.  You have pain in your back or in the area of your prostate. Summary  Prostate cancer is a common type of cancer in men. The prostate gland is located below the bladder and in front of the rectum. This gland adds fluid to semen during ejaculation.  Prostate cancer screening may identify cancer at an early stage, when the cancer can be treated more easily.  The prostate-specific antigen (PSA) test is the recommended screening test for prostate cancer.  Discuss the risks and benefits of prostate cancer screening with your health care provider. If you are age 27 or older, the risks that screening can cause are greater than the benefits that it may provide. This information is not intended to replace advice given to you by your health care provider. Make sure you discuss any questions you have with your health care provider. Document Revised: 10/25/2019 Document Reviewed: 02/14/2019 Elsevier Patient Education  [employer].

## 2020-12-23 NOTE — Progress Notes (Signed)
   12/23/2020 1:10 PM   Anthony Hunt 1951/07/16 [id number]  Reason for visit: Follow up UTI, elevated PSA  HPI: 70 year old male who I saw in December 2021 after recently being diagnosed with a Enterococcus UTI that completely resolved with antibiotics.  A CT was performed at that time that showed some perinephric stranding but no hydronephrosis or stone disease, prostate was enlarged at 115 g.  PVR was normal at that visit at 58 mL, and is normal again today at 40 mL.  He denies any urinary symptoms at all and IPSS score is 0 today.  PSA was checked by his PCP in May 2022 and was elevated at 6.86 which had increased from 3.12 in May 2021, and 2.87 in February 2019.  We discussed possible causes of false elevation of PSA including recent infection, inflammation, enlarged prostate/BPH.  His PSA density is very reassuring at 0.06.  We discussed options including transrectal ultrasound-guided biopsy with risks of bleeding, infection, and sepsis, prostate MRI, or repeat PSA with reflex to free in 3 months.  He would like to start by repeating the PSA in 3 months which I think is very reasonable.  If PSA remains elevated would warrant prostate MRI or biopsy.  Lab visit for PSA with reflex to free in 3 months, call with results  Billey Co, MD  [facility] [address], [place], [place] [address] [phone]

## 2021-01-04 ENCOUNTER — Telehealth: Payer: Self-pay | Admitting: Internal Medicine

## 2021-01-04 MED ORDER — BENAZEPRIL HCL 10 MG PO TABS
10.0000 mg | ORAL_TABLET | Freq: Every day | ORAL | 0 refills | Status: DC
Start: 2021-01-04 — End: 2021-03-10

## 2021-01-04 NOTE — Telephone Encounter (Signed)
Rx sent electronically. Spoke to wife.

## 2021-01-04 NOTE — Telephone Encounter (Signed)
Anthony Hunt called in due to they are on vacation and he got his medication. And he only needs 7 pills . The medication is benazepril (LOTENSIN) 10 MG tablet.  And the pharmacy - [facility] [address]- [phone]  [place], [place], [place] [address]

## 2021-01-13 DIAGNOSIS — H35033 Hypertensive retinopathy, bilateral: Secondary | ICD-10-CM | POA: Diagnosis not present

## 2021-01-13 DIAGNOSIS — H524 Presbyopia: Secondary | ICD-10-CM | POA: Diagnosis not present

## 2021-01-13 LAB — HM DIABETES EYE EXAM

## 2021-03-10 ENCOUNTER — Other Ambulatory Visit: Payer: Self-pay | Admitting: Internal Medicine

## 2021-03-25 ENCOUNTER — Other Ambulatory Visit: Payer: Self-pay

## 2021-03-25 ENCOUNTER — Other Ambulatory Visit: Payer: BC Managed Care – PPO

## 2021-03-25 DIAGNOSIS — N138 Other obstructive and reflux uropathy: Secondary | ICD-10-CM

## 2021-03-25 DIAGNOSIS — N401 Enlarged prostate with lower urinary tract symptoms: Secondary | ICD-10-CM | POA: Diagnosis not present

## 2021-03-26 ENCOUNTER — Telehealth: Payer: Self-pay

## 2021-03-26 DIAGNOSIS — R972 Elevated prostate specific antigen [PSA]: Secondary | ICD-10-CM

## 2021-03-26 LAB — PSA TOTAL (REFLEX TO FREE): Prostate Specific Ag, Serum: 9.1 ng/mL — ABNORMAL HIGH (ref 0.0–4.0)

## 2021-03-26 LAB — FPSA% REFLEX
% FREE PSA: 22.1 %
PSA, FREE: 2.01 ng/mL

## 2021-03-26 NOTE — Telephone Encounter (Signed)
Called pt informed her of the information below. Pt gave verbal understanding. MRI ordered. Instructions sent.

## 2021-03-26 NOTE — Telephone Encounter (Signed)
-----   Message from Billey Co, MD sent at 03/26/2021 10:13 AM EDT ----- PSA continues to increase, and is now 9.1.  This may just be secondary to his enlarged prostate, but would recommend a prostate MRI to evaluate for any suspicious lesions as we discussed in clinic.  If there are any abnormalities this may require biopsy of this in the future.  Nickolas Madrid, MD 03/26/2021

## 2021-04-22 ENCOUNTER — Telehealth: Payer: Self-pay | Admitting: Internal Medicine

## 2021-04-22 NOTE — Telephone Encounter (Addendum)
Pt came into office stating that he retired from his job and his insurance dropped and he applied for part B  and he has someone working on c and d. Pt stated medication Semaglutide has ran out. He still has three refills but no insurance. Part B is taken affect but C and D takes affect on November 1. Pt cannot afford medication with no insurance because medication is over 2,000/month. Pt wants to know if there is anything that the provider can do about medication. Pt wants to be called regarding this matter. Please advise

## 2021-04-22 NOTE — Telephone Encounter (Signed)
Spoke to pt. His insurance is being retroactive to Oct 1st. He found the medication at a pharmacy for $75. He will go ahead and get that.

## 2021-04-26 NOTE — Telephone Encounter (Signed)
Pt called stating that he did get medication, and it was $75 for 90 days. Pt stated that going forward it would be over $300 and would like to know if he could get a generic brand.

## 2021-04-26 NOTE — Telephone Encounter (Signed)
Spoke to pt. He will get with [place] before he runs out of what he purchased and decide about changes.

## 2021-06-04 ENCOUNTER — Other Ambulatory Visit: Payer: Self-pay

## 2021-06-04 ENCOUNTER — Ambulatory Visit (INDEPENDENT_AMBULATORY_CARE_PROVIDER_SITE_OTHER): Payer: Medicare HMO | Admitting: Internal Medicine

## 2021-06-04 ENCOUNTER — Encounter: Payer: Self-pay | Admitting: Internal Medicine

## 2021-06-04 VITALS — BP 114/66 | HR 59 | Temp 97.8°F | Ht 68.0 in | Wt 217.0 lb

## 2021-06-04 DIAGNOSIS — E1159 Type 2 diabetes mellitus with other circulatory complications: Secondary | ICD-10-CM | POA: Diagnosis not present

## 2021-06-04 DIAGNOSIS — E6609 Other obesity due to excess calories: Secondary | ICD-10-CM

## 2021-06-04 DIAGNOSIS — G4733 Obstructive sleep apnea (adult) (pediatric): Secondary | ICD-10-CM | POA: Diagnosis not present

## 2021-06-04 DIAGNOSIS — Z6833 Body mass index (BMI) 33.0-33.9, adult: Secondary | ICD-10-CM

## 2021-06-04 DIAGNOSIS — Z23 Encounter for immunization: Secondary | ICD-10-CM

## 2021-06-04 DIAGNOSIS — I2510 Atherosclerotic heart disease of native coronary artery without angina pectoris: Secondary | ICD-10-CM

## 2021-06-04 LAB — POCT GLYCOSYLATED HEMOGLOBIN (HGB A1C): Hemoglobin A1C: 5.5 % (ref 4.0–5.6)

## 2021-06-04 MED ORDER — METOPROLOL SUCCINATE ER 25 MG PO TB24
25.0000 mg | ORAL_TABLET | Freq: Every day | ORAL | 3 refills | Status: DC
Start: 2021-06-04 — End: 2022-03-24

## 2021-06-04 MED ORDER — SIMVASTATIN 80 MG PO TABS
80.0000 mg | ORAL_TABLET | Freq: Every day | ORAL | 3 refills | Status: DC
Start: 2021-06-04 — End: 2022-03-24

## 2021-06-04 MED ORDER — BENAZEPRIL HCL 10 MG PO TABS
10.0000 mg | ORAL_TABLET | Freq: Every day | ORAL | 3 refills | Status: DC
Start: 2021-06-04 — End: 2022-03-24

## 2021-06-04 MED ORDER — SEMAGLUTIDE 7 MG PO TABS
1.0000 | ORAL_TABLET | Freq: Every day | ORAL | 3 refills | Status: DC
Start: 2021-06-04 — End: 2022-01-11

## 2021-06-04 MED ORDER — METFORMIN HCL ER 500 MG PO TB24
1000.0000 mg | ORAL_TABLET | Freq: Every day | ORAL | 3 refills | Status: DC
Start: 2021-06-04 — End: 2022-03-24

## 2021-06-04 NOTE — Addendum Note (Signed)
Addended by: Pilar Grammes on: 06/04/2021 03:09 PM   Modules accepted: Orders

## 2021-06-04 NOTE — Progress Notes (Signed)
Subjective:    Patient ID: Anthony Hunt, male    DOB: 11-19-1950, 70 y.o.   MRN: [id number]  HPI Here for follow up of diabetes and other chronic health conditions This visit occurred during the SARS-CoV-2 public health emergency.  Safety protocols were in place, including screening questions prior to the visit, additional usage of staff PPE, and extensive cleaning of exam room while observing appropriate contact time as indicated for disinfecting solutions.   Checking sugars every 2 weeks or so Sticking with healthy eating No GI problems with semaglutide No foot numbness or burning  No chest pain or SOB No dizziness or syncope No palpitations No edema No set exercise---but plans to start Silver Sneakers  Current Outpatient Medications on File Prior to Visit  Medication Sig Dispense Refill   aspirin 81 MG tablet Take 81 mg by mouth daily.     benazepril (LOTENSIN) 10 MG tablet TAKE 1 TABLET BY MOUTH EVERY DAY 90 tablet 3   glucose blood test strip Use to check blood sugar once a day. Dx Code E11.59 100 each 4   metFORMIN (GLUCOPHAGE-XR) 500 MG 24 hr tablet TAKE 2 TABLETS BY MOUTH DAILY WITH BREAKFAST. 180 tablet 3   metoprolol succinate (TOPROL-XL) 25 MG 24 hr tablet TAKE 1 TABLET BY MOUTH EVERY DAY 90 tablet 3   OneTouch Delica Lancets 34V MISC 1 each by Does not apply route daily. Use to check blood sugar daily. Dx Code E11.59 100 each 4   Semaglutide 7 MG TABS Take 1 tablet by mouth daily. Start in 1 month 90 tablet 3   simvastatin (ZOCOR) 80 MG tablet TAKE 1 TABLET BY MOUTH EVERYDAY AT BEDTIME 90 tablet 3   No current facility-administered medications on file prior to visit.    No Known Allergies  Past Medical History:  Diagnosis Date   Allergy    Bright's disease    age 42-6  no issues since    CAD (coronary artery disease) 2005   MI   Hyperlipidemia    Hypertension    Myocardial infarction ([place]) 2005   Pre-diabetes    Sleep apnea    Uses Cpap    Past  Surgical History:  Procedure Laterality Date   CERVICAL DISC SURGERY     CORONARY STENT PLACEMENT  2005   ULNAR NERVE REPAIR     Left ulnar nerve decompression 02/07   US ECHOCARDIOGRAPHY      mild inf hypokinesis, EF 55%  09/05    Family History  Problem Relation Age of Onset   Coronary artery disease Mother    Peripheral vascular disease Mother    Cancer Maternal Uncle    Cancer Paternal Aunt    Diabetes Neg Hx    Hypertension Neg Hx    Colon cancer Neg Hx    Colon polyps Neg Hx    Esophageal cancer Neg Hx    Rectal cancer Neg Hx    Stomach cancer Neg Hx    Bladder Cancer Neg Hx    Prostate cancer Neg Hx    Kidney cancer Neg Hx     Social History   Socioeconomic History   Marital status: Married    Spouse name: Not on file   Number of children: 1   Years of education: Not on file   Highest education level: Not on file  Occupational History   Occupation: [employer]    Comment: Retired 10/22  Tobacco Use   Smoking status: Never   Smokeless  tobacco: Current    Types: Chew  Vaping Use   Vaping Use: Never used  Substance and Sexual Activity   Alcohol use: No    Alcohol/week: 0.0 standard drinks   Drug use: No   Sexual activity: Not on file  Other Topics Concern   Not on file  Social History Narrative   Not on file   Social Determinants of Health   Financial Resource Strain: Not on file  Food Insecurity: Not on file  Transportation Needs: Not on file  Physical Activity: Not on file  Stress: Not on file  Social Connections: Not on file  Intimate Partner Violence: Not on file   Review of Systems No joint pains Sleeps well---uses CPAP nightly    Objective:   Physical Exam Constitutional:      Appearance: Normal appearance.  Cardiovascular:     Rate and Rhythm: Normal rate and regular rhythm.     Pulses: Normal pulses.     Heart sounds: No murmur heard.   No gallop.  Pulmonary:     Effort: Pulmonary effort is normal.     Breath sounds:  Normal breath sounds. No wheezing or rales.  Musculoskeletal:     Cervical back: Neck supple.     Right lower leg: No edema.     Left lower leg: No edema.  Lymphadenopathy:     Cervical: No cervical adenopathy.  Skin:    Findings: No rash.  Neurological:     Mental Status: He is alert.  Psychiatric:        Mood and Affect: Mood normal.        Behavior: Behavior normal.           Assessment & Plan:

## 2021-06-04 NOTE — Assessment & Plan Note (Signed)
Lab Results  Component Value Date   HGBA1C 5.5 06/04/2021   Continues to have excellent control on semaglutide and metformin If money issues--could consider victoza or trulicity

## 2021-06-04 NOTE — Assessment & Plan Note (Signed)
Has maintained weight loss on semaglutide Discussed increase to 14mg --will hold off

## 2021-06-04 NOTE — Patient Instructions (Signed)
If the rybelsus is not affordable, we should consider victoza or trulicity (both injections)

## 2021-06-04 NOTE — Assessment & Plan Note (Signed)
Quiet on metoprolol, benzepril, simvastatin, ASA

## 2021-06-04 NOTE — Assessment & Plan Note (Signed)
Sleeps nightly on the CPAP

## 2021-06-07 ENCOUNTER — Telehealth: Payer: Self-pay | Admitting: Internal Medicine

## 2021-06-07 NOTE — Telephone Encounter (Signed)
Spoke to pt. While talking to him I looked at my faxes and found a form about his simvastatin. We will fill it out and send back.

## 2021-06-07 NOTE — Telephone Encounter (Signed)
Pt called in stated [facility] did fill all but one proscription stated they needed additional info . Would like to get  this resolved . Pt did not know the name of RX   [phone]

## 2021-06-08 ENCOUNTER — Other Ambulatory Visit: Payer: Self-pay | Admitting: Internal Medicine

## 2021-06-08 NOTE — Telephone Encounter (Signed)
Spoke to pt. Dr Silvio Pate gave him a l;ist of the injections. If he cannot find them he will call me back. He will check with insurance on coverage and let us know what he wants to do.

## 2021-06-08 NOTE — Telephone Encounter (Signed)
Pt called in to know is there other options for Rx Semaglutide 7 MG TABS . Pharmacy is charging $459.00 can not afford . Please advise [phone]

## 2021-06-17 ENCOUNTER — Other Ambulatory Visit: Payer: Self-pay

## 2021-06-17 ENCOUNTER — Ambulatory Visit
Admission: RE | Admit: 2021-06-17 | Discharge: 2021-06-17 | Disposition: A | Discharge status: Home / Self Care | Payer: BC Managed Care – PPO | Source: Ambulatory Visit | Attending: Urology | Admitting: Urology

## 2021-06-17 DIAGNOSIS — N400 Enlarged prostate without lower urinary tract symptoms: Secondary | ICD-10-CM | POA: Diagnosis not present

## 2021-06-17 DIAGNOSIS — R972 Elevated prostate specific antigen [PSA]: Secondary | ICD-10-CM | POA: Diagnosis not present

## 2021-06-17 DIAGNOSIS — R590 Localized enlarged lymph nodes: Secondary | ICD-10-CM | POA: Diagnosis not present

## 2021-06-17 MED ORDER — GADOBUTROL 1 MMOL/ML IV SOLN
9.0000 mL | Freq: Once | INTRAVENOUS | Status: AC | PRN
  Administered 2021-06-17: 9 mL via INTRAVENOUS

## 2021-06-24 ENCOUNTER — Encounter: Payer: Self-pay | Admitting: Internal Medicine

## 2021-06-24 ENCOUNTER — Telehealth (INDEPENDENT_AMBULATORY_CARE_PROVIDER_SITE_OTHER): Payer: BC Managed Care – PPO | Admitting: Internal Medicine

## 2021-06-24 ENCOUNTER — Other Ambulatory Visit: Payer: Self-pay

## 2021-06-24 DIAGNOSIS — R519 Headache, unspecified: Secondary | ICD-10-CM | POA: Diagnosis not present

## 2021-06-24 DIAGNOSIS — U071 COVID-19: Secondary | ICD-10-CM | POA: Insufficient documentation

## 2021-06-24 DIAGNOSIS — R0981 Nasal congestion: Secondary | ICD-10-CM

## 2021-06-24 DIAGNOSIS — R509 Fever, unspecified: Secondary | ICD-10-CM

## 2021-06-24 DIAGNOSIS — R059 Cough, unspecified: Secondary | ICD-10-CM | POA: Diagnosis not present

## 2021-06-24 MED ORDER — NIRMATRELVIR/RITONAVIR (PAXLOVID)TABLET: 3.0000 | ORAL_TABLET | Freq: Two times a day (BID) | ORAL | 0 refills | Status: AC

## 2021-06-24 NOTE — Progress Notes (Signed)
Subjective:    Patient ID: Anthony Hunt, male    DOB: 02-Sep-1950, 70 y.o.   MRN: [id number]  HPI Telephone virtual visit due to COVID infection Identification done Reviewed limitations and billing and he gave consent Participants--patient at home and I am in my office  Started with symptoms last night---fever, headache (not too bad) Some stuffy nose and a few coughs No SOB Some aching in back--mild Feels cold---and some shaking  Hasn't taken antipyretics Usually uses ibuprofen prn but hasn't used it  Wife positive 3 days ago  Current Outpatient Medications on File Prior to Visit  Medication Sig Dispense Refill   aspirin 81 MG tablet Take 81 mg by mouth daily.     benazepril (LOTENSIN) 10 MG tablet Take 1 tablet (10 mg total) by mouth daily. 90 tablet 3   glucose blood test strip Use to check blood sugar once a day. Dx Code E11.59 100 each 4   metFORMIN (GLUCOPHAGE-XR) 500 MG 24 hr tablet Take 2 tablets (1,000 mg total) by mouth daily with breakfast. 180 tablet 3   metoprolol succinate (TOPROL-XL) 25 MG 24 hr tablet Take 1 tablet (25 mg total) by mouth daily. 90 tablet 3   OneTouch Delica Lancets 34H MISC 1 each by Does not apply route daily. Use to check blood sugar daily. Dx Code E11.59 100 each 4   Semaglutide 7 MG TABS Take 1 tablet by mouth daily. Start in 1 month 90 tablet 3   simvastatin (ZOCOR) 80 MG tablet Take 1 tablet (80 mg total) by mouth daily at 6 PM. 90 tablet 3   No current facility-administered medications on file prior to visit.    No Known Allergies  Past Medical History:  Diagnosis Date   Allergy    Bright's disease    age 87-6  no issues since    CAD (coronary artery disease) 2005   MI   Hyperlipidemia    Hypertension    Myocardial infarction ([place]) 2005   Pre-diabetes    Sleep apnea    Uses Cpap    Past Surgical History:  Procedure Laterality Date   CERVICAL DISC SURGERY     CORONARY STENT PLACEMENT  2005   ULNAR NERVE REPAIR     Left  ulnar nerve decompression 02/07   US ECHOCARDIOGRAPHY      mild inf hypokinesis, EF 55%  09/05    Family History  Problem Relation Age of Onset   Coronary artery disease Mother    Peripheral vascular disease Mother    Cancer Maternal Uncle    Cancer Paternal Aunt    Diabetes Neg Hx    Hypertension Neg Hx    Colon cancer Neg Hx    Colon polyps Neg Hx    Esophageal cancer Neg Hx    Rectal cancer Neg Hx    Stomach cancer Neg Hx    Bladder Cancer Neg Hx    Prostate cancer Neg Hx    Kidney cancer Neg Hx     Social History   Socioeconomic History   Marital status: Married    Spouse name: Not on file   Number of children: 1   Years of education: Not on file   Highest education level: Not on file  Occupational History   Occupation: [employer]    Comment: Retired 10/22  Tobacco Use   Smoking status: Never   Smokeless tobacco: Current    Types: Chew  Vaping Use   Vaping Use: Never used  Substance and Sexual Activity   Alcohol use: No    Alcohol/week: 0.0 standard drinks   Drug use: No   Sexual activity: Not on file  Other Topics Concern   Not on file  Social History Narrative   Not on file   Social Determinants of Health   Financial Resource Strain: Not on file  Food Insecurity: Not on file  Transportation Needs: Not on file  Physical Activity: Not on file  Stress: Not on file  Social Connections: Not on file  Intimate Partner Violence: Not on file   Review of Systems No loss of taste or smell No N/V Eating okay    Objective:   Physical Exam Constitutional:      Comments: Normal voice  Pulmonary:     Effort: No respiratory distress.  Neurological:     Mental Status: He is alert.           Assessment & Plan:

## 2021-06-24 NOTE — Assessment & Plan Note (Addendum)
Having fever and chills but minimal respiratory symptoms Had 2 vaccines and 1 booster so should have reasonable protection Discussed anti-virals---he would like to try (discussed side effects, etc) He will not take simvastatin for the next 10 days Ibuprofen for symptom relief Discussed quarantine  Total time 11 minutes

## 2021-07-02 ENCOUNTER — Telehealth: Payer: Self-pay | Admitting: *Deleted

## 2021-07-02 NOTE — Telephone Encounter (Signed)
Patient called Triage line regarding MRI results-Reviewed prostate MRI results-concerned with the steady rise in PSA level. He is concerned with PSA results from his previous labs. Would like to know if there is anything he can do or should be concerned with PSA levels. Offered appointment to discuss further, declined.

## 2021-12-03 ENCOUNTER — Encounter: Payer: Medicare HMO | Admitting: Internal Medicine

## 2021-12-17 ENCOUNTER — Other Ambulatory Visit: Payer: Medicare HMO

## 2021-12-17 ENCOUNTER — Encounter: Payer: Self-pay | Admitting: Internal Medicine

## 2021-12-17 ENCOUNTER — Ambulatory Visit (INDEPENDENT_AMBULATORY_CARE_PROVIDER_SITE_OTHER): Payer: Medicare HMO | Admitting: Internal Medicine

## 2021-12-17 VITALS — BP 122/72 | HR 59 | Temp 96.9°F | Ht 67.0 in | Wt 222.0 lb

## 2021-12-17 DIAGNOSIS — Z0000 Encounter for general adult medical examination without abnormal findings: Secondary | ICD-10-CM

## 2021-12-17 DIAGNOSIS — G4733 Obstructive sleep apnea (adult) (pediatric): Secondary | ICD-10-CM

## 2021-12-17 DIAGNOSIS — I2510 Atherosclerotic heart disease of native coronary artery without angina pectoris: Secondary | ICD-10-CM

## 2021-12-17 DIAGNOSIS — R972 Elevated prostate specific antigen [PSA]: Secondary | ICD-10-CM

## 2021-12-17 DIAGNOSIS — D696 Thrombocytopenia, unspecified: Secondary | ICD-10-CM | POA: Diagnosis not present

## 2021-12-17 DIAGNOSIS — E1159 Type 2 diabetes mellitus with other circulatory complications: Secondary | ICD-10-CM | POA: Diagnosis not present

## 2021-12-17 LAB — CBC
HCT: 47.7 % (ref 39.0–52.0)
Hemoglobin: 15.9 g/dL (ref 13.0–17.0)
MCHC: 33.4 g/dL (ref 30.0–36.0)
MCV: 99.7 fl (ref 78.0–100.0)
Platelets: 119 10*3/uL — ABNORMAL LOW (ref 150.0–400.0)
RBC: 4.79 Mil/uL (ref 4.22–5.81)
RDW: 13.4 % (ref 11.5–15.5)
WBC: 5.8 10*3/uL (ref 4.0–10.5)

## 2021-12-17 LAB — COMPREHENSIVE METABOLIC PANEL
ALT: 28 U/L (ref 0–53)
AST: 23 U/L (ref 0–37)
Albumin: 4.1 g/dL (ref 3.5–5.2)
Alkaline Phosphatase: 37 U/L — ABNORMAL LOW (ref 39–117)
BUN: 20 mg/dL (ref 6–23)
CO2: 27 mEq/L (ref 19–32)
Calcium: 9.2 mg/dL (ref 8.4–10.5)
Chloride: 107 mEq/L (ref 96–112)
Creatinine, Ser: 1.05 mg/dL (ref 0.40–1.50)
GFR: 72.78 mL/min (ref >60.00)
Glucose, Bld: 106 mg/dL — ABNORMAL HIGH (ref 70–99)
Potassium: 4.2 mEq/L (ref 3.5–5.1)
Sodium: 141 mEq/L (ref 135–145)
Total Bilirubin: 1 mg/dL (ref 0.2–1.2)
Total Protein: 6.6 g/dL (ref 6.0–8.3)

## 2021-12-17 LAB — HEMOGLOBIN A1C: Hgb A1c MFr Bld: 5.9 % (ref 4.6–6.5)

## 2021-12-17 LAB — MICROALBUMIN / CREATININE URINE RATIO
Creatinine,U: 181 mg/dL
Microalb Creat Ratio: 0.4 mg/g (ref 0.0–30.0)
Microalb, Ur: 0.7 mg/dL (ref 0.0–1.9)

## 2021-12-17 LAB — LIPID PANEL
Cholesterol: 131 mg/dL (ref 0–200)
HDL: 36.4 mg/dL — ABNORMAL LOW (ref >39.00)
LDL Cholesterol: 75 mg/dL (ref 0–99)
NonHDL: 94.11
Total CHOL/HDL Ratio: 4
Triglycerides: 98 mg/dL (ref 0.0–149.0)
VLDL: 19.6 mg/dL (ref 0.0–40.0)

## 2021-12-17 LAB — HM DIABETES FOOT EXAM

## 2021-12-17 LAB — PSA: PSA: 6.83 ng/mL — ABNORMAL HIGH (ref 0.10–4.00)

## 2021-12-17 NOTE — Progress Notes (Signed)
Subjective:    Patient ID: Anthony Hunt, male    DOB: Dec 15, 1950, 71 y.o.   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     benazepril (LOTENSIN) 10 MG tablet Take 1 tablet (10 mg total) by mouth daily. 90 tablet 3   glucose blood test strip Use to check blood sugar once a day. Dx Code E11.59 100 each 4   metFORMIN (GLUCOPHAGE-XR) 500 MG 24 hr tablet Take 2 tablets (1,000 mg total) by mouth daily with breakfast. 180 tablet 3   metoprolol succinate (TOPROL-XL) 25 MG 24 hr tablet Take 1 tablet (25 mg total) by mouth daily. 90 tablet 3   OneTouch Delica Lancets 40J MISC 1 each by Does not apply route daily. Use to check blood sugar daily. Dx Code E11.59 100 each 4   Semaglutide 7 MG TABS Take 1 tablet by mouth daily. Start in 1 month 90 tablet 3   simvastatin (ZOCOR) 80 MG tablet Take 1  tablet (80 mg total) by mouth daily at 6 PM. 90 tablet 3   No current facility-administered medications on file prior to visit.    No Known Allergies  Past Medical History:  Diagnosis Date   Allergy    Bright's disease    age 29-6  no issues since    CAD (coronary artery disease) 2005   MI   Hyperlipidemia    Hypertension    Myocardial infarction ([place]) 2005   Pre-diabetes    Sleep apnea    Uses Cpap    Past Surgical History:  Procedure Laterality Date   CERVICAL DISC SURGERY     CORONARY STENT PLACEMENT  2005   ULNAR NERVE REPAIR     Left ulnar nerve decompression 02/07   US ECHOCARDIOGRAPHY      mild inf hypokinesis, EF 55%  09/05    Family History  Problem Relation Age of Onset   Coronary artery disease Mother    Peripheral vascular disease Mother    Cancer Maternal Uncle    Cancer Paternal Aunt    Diabetes Neg Hx    Hypertension Neg Hx    Colon cancer Neg Hx    Colon polyps Neg Hx    Esophageal cancer Neg Hx    Rectal cancer Neg Hx    Stomach cancer Neg Hx    Bladder Cancer Neg Hx    Prostate cancer Neg Hx    Kidney cancer Neg Hx     Social History  Socioeconomic History   Marital status: Married    Spouse name: Not on file   Number of children: 1   Years of education: Not on file   Highest education level: Not on file  Occupational History   Occupation: [employer]    Comment: Retired 10/22   Occupation: Leisure centre manager    Comment: Scientist, product/process development  Tobacco Use   Smoking status: Never    Passive exposure: Past   Smokeless tobacco: Current    Types: Chew  Vaping Use   Vaping Use: Never used  Substance and Sexual Activity   Alcohol use: No    Alcohol/week: 0.0 standard drinks   Drug use: No   Sexual activity: Not on file  Other Topics Concern   Not on file  Social History Narrative   No living will   Wife would be health care POA_--alternate is son   Would accept resuscitation attempts   Not sure about tube feeds   Social  Determinants of Health   Financial Resource Strain: Not on file  Food Insecurity: Not on file  Transportation Needs: Not on file  Physical Activity: Not on file  Stress: Not on file  Social Connections: Not on file  Intimate Partner Violence: Not on file   Review of Systems Appetite is good Weight is stable Sleeps well Teeth fine--keeps up with dentist No suspicious skin lesions Rare heartburn---no dysphagia Bowels move fine--no blood No sig back or joint pains     Objective:   Physical Exam Constitutional:      Appearance: Normal appearance.  HENT:     Mouth/Throat:     Comments: No lesions Eyes:     Conjunctiva/sclera: Conjunctivae normal.     Pupils: Pupils are equal, round, and reactive to light.  Cardiovascular:     Rate and Rhythm: Normal rate and regular rhythm.     Pulses: Normal pulses.     Heart sounds: No murmur heard.   No gallop.  Pulmonary:     Effort: Pulmonary effort is normal.     Breath sounds: Normal breath sounds. No wheezing or rales.  Abdominal:     Palpations: Abdomen is soft.     Tenderness: There is no abdominal tenderness.  Musculoskeletal:     Cervical back: Neck supple.     Right lower leg: No edema.     Left lower leg: No edema.  Lymphadenopathy:     Cervical: No cervical adenopathy.  Skin:    Findings: No rash.     Comments: No foot lesions  Neurological:     General: No focal deficit present.     Mental Status: He is alert and oriented to person, place, and time.     Comments: Mini-cog normal Normal sensation in feet  Psychiatric:        Mood and Affect: Mood normal.        Behavior: Behavior normal.           Assessment & Plan:

## 2021-12-17 NOTE — Assessment & Plan Note (Signed)
No abnormal bleeding

## 2021-12-17 NOTE — Assessment & Plan Note (Signed)
Uses CPAP nightly--needs new machine

## 2021-12-17 NOTE — Assessment & Plan Note (Signed)
I have personally reviewed the Medicare Annual Wellness questionnaire and have noted 1. The patient's medical and social history 2. Their use of alcohol, tobacco or illicit drugs 3. Their current medications and supplements 4. The patient's functional ability including ADL's, fall risks, home safety risks and hearing or visual             impairment. 5. Diet and physical activities 6. Evidence for depression or mood disorders  The patients weight, height, BMI and visual acuity have been recorded in the chart I have made referrals, counseling and provided education to the patient based review of the above and I have provided the pt with a written personalized care plan for preventive services.  I have provided you with a copy of your personalized plan for preventive services. Please take the time to review along with your updated medication list.  Colonoscopy due in August COVID booster and flu vaccines by the fall Discussed exercise on day's off Will check PSA

## 2021-12-17 NOTE — Assessment & Plan Note (Signed)
MRI reassuring last year Going back to Dr Diamantina [place]

## 2021-12-17 NOTE — Progress Notes (Signed)
Hearing Screening   '500Hz'$  '1000Hz'$  '2000Hz'$  '4000Hz'$   Right ear '20 20 20 '$ 0  Left ear 40 40 0 0   Vision Screening   Right eye Left eye Both eyes  Without correction     With correction 20/20 20/15 20/13

## 2021-12-17 NOTE — Assessment & Plan Note (Signed)
Hopefully still good control on semaglutide '7mg'$  daily and metformin '1000mg'$  daily

## 2021-12-17 NOTE — Assessment & Plan Note (Signed)
Has been quiet On ASA 81, simvastatin 80 (on long term), metoprolol 25 daily and benazepril 10

## 2021-12-22 ENCOUNTER — Ambulatory Visit: Payer: Medicare HMO | Admitting: Urology

## 2021-12-22 ENCOUNTER — Encounter: Payer: Self-pay | Admitting: Urology

## 2021-12-22 VITALS — BP 127/70 | HR 54 | Ht 67.0 in | Wt 212.0 lb

## 2021-12-22 DIAGNOSIS — R972 Elevated prostate specific antigen [PSA]: Secondary | ICD-10-CM | POA: Diagnosis not present

## 2021-12-22 NOTE — Patient Instructions (Signed)
Your PSA is mildly elevated, but all evidence points to this being benign from enlargement of your prostate, and not from prostate cancer.  Your prostate MRI showed no worrisome lesions, your PSA density is low, and your percentage free PSA is elevated which all point to a benign(non-cancerous) process.   Prostate Cancer Screening  Prostate cancer screening is testing that is done to check for the presence of prostate cancer in men. The prostate gland is a walnut-sized gland that is located below the bladder and in front of the rectum in males. The function of the prostate is to add fluid to semen during ejaculation. Prostate cancer is one of the most common types of cancer in men. Who should have prostate cancer screening? Screening recommendations vary based on age and other risk factors, as well as between the professional organizations who make the recommendations. In general, screening is recommended if: You are age 69 to 69 and have an average risk for prostate cancer. You should talk with your health care provider about your need for screening and how often screening should be done. Because most prostate cancers are slow growing and will not cause death, screening in this age group is generally reserved for men who have a 63- to 15-year life expectancy. You are younger than age 69, and you have these risk factors: Having a father, brother, or uncle who has been diagnosed with prostate cancer. The risk is higher if your family member's cancer occurred at an early age or if you have multiple family members with prostate cancer at an early age. Being a male who is [place] or is of [place] or sub-Saharan African descent. In general, screening is not recommended if: You are younger than age 51. You are between the ages of 69 and 38 and you have no risk factors. You are 69 years of age or older. At this age, the risks that screening can cause are greater than the benefits that it may provide. If  you are at high risk for prostate cancer, your health care provider may recommend that you have screenings more often or that you start screening at a younger age. How is screening for prostate cancer done? The recommended prostate cancer screening test is a blood test called the prostate-specific antigen (PSA) test. PSA is a protein that is made in the prostate. As you age, your prostate naturally produces more PSA. Abnormally high PSA levels may be caused by: Prostate cancer. An enlarged prostate that is not caused by cancer (benign prostatic hyperplasia, or BPH). This condition is very common in older men. A prostate gland infection (prostatitis) or urinary tract infection. Certain medicines such as male hormones (like testosterone) or other medicines that raise testosterone levels. A rectal exam may be done as part of prostate cancer screening to help provide information about the size of your prostate gland. When a rectal exam is performed, it should be done after the PSA level is drawn to avoid any effect on the results. Depending on the PSA results, you may need more tests, such as: A physical exam to check the size of your prostate gland, if not done as part of screening. Blood and imaging tests. A procedure to remove tissue samples from your prostate gland for testing (biopsy). This is the only way to know for certain if you have prostate cancer. What are the benefits of prostate cancer screening? Screening can help to identify cancer at an early stage, before symptoms start and when the cancer  can be treated more easily. There is a small chance that screening may lower your risk of dying from prostate cancer. The chance is small because prostate cancer is a slow-growing cancer, and most men with prostate cancer die from a different cause. What are the risks of prostate cancer screening? The main risk of prostate cancer screening is diagnosing and treating prostate cancer that would never  have caused any symptoms or problems. This is called overdiagnosisand overtreatment. PSA screening cannot tell you if your PSA is high due to cancer or a different cause. A prostate biopsy is the only procedure to diagnose prostate cancer. Even the results of a biopsy may not tell you if your cancer needs to be treated. Slow-growing prostate cancer may not need any treatment other than monitoring, so diagnosing and treating it may cause unnecessary stress or other side effects. Questions to ask your health care provider When should I start prostate cancer screening? What is my risk for prostate cancer? How often do I need screening? What type of screening tests do I need? How do I get my test results? What do my results mean? Do I need treatment? Where to find more information The American Cancer Society: www.cancer.org American Urological Association: www.auanet.org Contact a health care provider if: You have difficulty urinating. You have pain when you urinate or ejaculate. You have blood in your urine or semen. You have pain in your back or in the area of your prostate. Summary Prostate cancer is a common type of cancer in men. The prostate gland is located below the bladder and in front of the rectum. This gland adds fluid to semen during ejaculation. Prostate cancer screening may identify cancer at an early stage, when the cancer can be treated more easily and is less likely to have spread to other areas of the body. The prostate-specific antigen (PSA) test is the recommended screening test for prostate cancer, but it has associated risks. Discuss the risks and benefits of prostate cancer screening with your health care provider. If you are age 69 or older, the risks that screening can cause are greater than the benefits that it may provide. This information is not intended to replace advice given to you by your health care provider. Make sure you discuss any questions you have with your  health care provider. Document Revised: 12/28/2020 Document Reviewed: 12/28/2020 Elsevier Patient Education  [place].

## 2021-12-22 NOTE — Progress Notes (Signed)
   12/22/2021 9:50 AM   Encarnacion Chu [birth year] [id number]  Reason for visit: Follow up UTI, elevated PSA  HPI: 71 year old male who I saw in December 2021 after recently being diagnosed with a Enterococcus UTI that completely resolved with antibiotics.  A CT was performed at that time that showed some perinephric stranding but no hydronephrosis or stone disease, prostate was enlarged at 115 g.  PVRs have always been normal less than 50 mL.  He continues to deny any urinary symptoms today.  PSA was checked by his PCP in May 2022 and was elevated at 6.86 which had increased from 3.12 in May 2021, and 2.87 in February 2019.  Repeat PSA in September 2022 continue to increase at 9.1, with reassuring percentage free PSA of 22%, and he opted for a prostate MRI.  MRI in December 2022 showed no evidence of prostate cancer, and enlarged transition zone, and volume was measured at 90 g.  Recent PSA 12/17/2021 decreased to 6.83 which is stable from 6.86 last year.  We reviewed possible etiologies of his elevated PSA ranging from benign to malignant.  All evidence thus far points to BPH and benign process with his stable PSA over the last few years, prostate MRI with no suspicious lesions, low PSA density, and elevated percentage free PSA.  We discussed alternatives including a prostate biopsy versus ongoing PSA monitoring, and using shared decision making he opted to continue yearly PSA.  Risk and benefits were discussed.  RTC 1 year PSA reflex to free prior, if stable can continue follow-up with PCP, and discontinue screening around age 55 per guideline recommendations  [facility], [place] [address], [place], [place] [address] [phone]

## 2021-12-24 ENCOUNTER — Ambulatory Visit (INDEPENDENT_AMBULATORY_CARE_PROVIDER_SITE_OTHER): Payer: Medicare HMO | Admitting: Primary Care

## 2021-12-24 ENCOUNTER — Encounter: Payer: Self-pay | Admitting: Primary Care

## 2021-12-24 VITALS — BP 130/60 | HR 74 | Temp 97.9°F | Ht 67.0 in | Wt 225.2 lb

## 2021-12-24 DIAGNOSIS — G4733 Obstructive sleep apnea (adult) (pediatric): Secondary | ICD-10-CM

## 2021-12-24 NOTE — Assessment & Plan Note (Signed)
-   HST 11/18/13 >> AHI 64.9, SaO2 low 67%.  Maintained on auto CPAP. Patient is 91% compliant with use greater than 4 hours. Current pressure 10-20cm h20; residual AHI 1.5/hr. No changes needed today. Due for new CPAP machine, we will place an order with [facility]. Advised patient to wear CPAP every night for 4-6 hours or longer. Continue weight loss efforts. FU in 1 year or sooner if needed.

## 2021-12-24 NOTE — Patient Instructions (Addendum)
Download shows excellent compliance We will place an order for replacement CPAP machine through [facility] at current pressure settings 10-20 cm H2O Continue to wear CPAP every night for minimum 4 to 6 hours or longer Follow-up in 1 year with Dr. Halford Chessman or sooner if needed

## 2021-12-24 NOTE — Progress Notes (Signed)
Reviewed and agree with assessment/plan.   Chesley Mires, MD [facility] Main Pulmonary/Critical Care 12/24/2021, 9:40 AM Pager:  [phone]

## 2021-12-24 NOTE — Progress Notes (Signed)
$'@Patient'C$  ID: Anthony Hunt, male    DOB: [birth year], 71 y.o.   MRN: [id number]  Chief Complaint  Patient presents with   Consult    Needs a new machine, making noise and leaking air.  Has had machine since 2015.    Referring provider: Venia Carbon, MD  HPI: 71 year old male code never smoked.  Past medical history significant for obstructive sleep apnea, allergic rhinitis, coronary artery disease, type 2 diabetes, elevated PSA, thrombocytopenia, hyperlipidemia, obesity.  Former patient of Dr. Halford Chessman, last seen in office on 03/09/2016.  Home sleep test on 11/18/2013 showed severe obstructive sleep apnea>> AHI 64.9/hr with SPO2 low 67%. Maintained on auto CPAP.  12/24/2021- Interim hx  Patient presents today for an overdue follow-up for OSA. He is doing well. No acute complaints. Sleeping well throught the night. No longer snoring. No residual daytime sleepiness or fatigue. No issues with CPAP pressure setting or mask fit.  Typical bedtime is between 1030 and 11:30 PM.  It takes him on average 10 to 15 minutes to fall asleep.  He does not wake up during the night.  He starts his day at 7 AM.  His weight is down 15 to 18 pounds in the last 2 years.  He remains on CPAP auto settings 10 to 20 cm H2O.  He is compliant with use.  Machine is greater then 69 years old.  It is in working order but he is due for a new one.  DME company is [facility].  [facility] download 09/25/2021 - 12/23/2021 Usage days 82/90 days (91%) > 4 hours  Pressure 10-20 cm H2O (13.8 cm H2O-95%) Air leaks 28.1 L/min (95%) AHI 1.5     Sleep tests: HST 11/18/13 >> AHI 64.9, SaO2 low 67%.  Obstructive and central events.  260.2 min with SaO2 < 90%. Auto CPAP 02/07/16 to 03/07/16 >> used on 30 of 30 nights with average 7 hrs 48 min.  Average AHI 1.9 with median CPAP 11 and 95 th percentile CPAP 14 cm H2O    No Known Allergies  Immunization History  Administered Date(s) Administered   Fluad Quad(high Dose 65+) 03/21/2019, 06/04/2021    Influenza Split 05/19/2011, 08/16/2012   Influenza, High Dose Seasonal PF 06/25/2018   Influenza,inj,Quad PF,6+ Mos 08/16/2013, 08/19/2014, 08/25/2015, 08/30/2016, 09/01/2017, 06/02/2020   PFIZER(Purple Top)SARS-COV-2 Vaccination 08/29/2019, 09/19/2019, 07/02/2020   Pneumococcal Conjugate-13 11/22/2019   Pneumococcal Polysaccharide-23 08/20/2009, 12/02/2020   Td 04/14/2004   Tdap 08/19/2014   Zoster Recombinat (Shingrix) 11/22/2019, 06/02/2020   Zoster, Live 09/15/2013    Past Medical History:  Diagnosis Date   Allergy    Bright's disease    age [age]  no issues since    CAD (coronary artery disease) 2005   MI   Hyperlipidemia    Hypertension    Myocardial infarction ([place]) 2005   Pre-diabetes    Sleep apnea    Uses Cpap    Tobacco History: Social History   Tobacco Use  Smoking Status Never   Passive exposure: Past  Smokeless Tobacco Current   Types: Chew  Tobacco Comments   Occasionally, couple times a week (on the weekends).  12/24/2021 hfb   Ready to quit: Not Answered Counseling given: Not Answered Tobacco comments: Occasionally, couple times a week (on the weekends).  12/24/2021 hfb   Outpatient Medications Prior to Visit  Medication Sig Dispense Refill   aspirin 81 MG tablet Take 81 mg by mouth daily.     benazepril (LOTENSIN) 10 MG tablet Take  1 tablet (10 mg total) by mouth daily. 90 tablet 3   glucose blood test strip Use to check blood sugar once a day. Dx Code E11.59 100 each 4   metFORMIN (GLUCOPHAGE-XR) 500 MG 24 hr tablet Take 2 tablets (1,000 mg total) by mouth daily with breakfast. 180 tablet 3   metoprolol succinate (TOPROL-XL) 25 MG 24 hr tablet Take 1 tablet (25 mg total) by mouth daily. 90 tablet 3   OneTouch Delica Lancets 44I MISC 1 each by Does not apply route daily. Use to check blood sugar daily. Dx Code E11.59 100 each 4   Semaglutide 7 MG TABS Take 1 tablet by mouth daily. Start in 1 month 90 tablet 3   simvastatin (ZOCOR) 80 MG tablet Take 1  tablet (80 mg total) by mouth daily at 6 PM. 90 tablet 3   No facility-administered medications prior to visit.   Review of Systems  Review of Systems  Constitutional: Negative.  Negative for fatigue.  HENT: Negative.    Respiratory: Negative.  Negative for apnea.   Psychiatric/Behavioral: Negative.      Physical Exam  BP 130/60 (BP Location: Left Arm, Patient Position: Sitting, Cuff Size: Large)   Pulse 74   Temp 97.9 F (36.6 C) (Oral)   Ht '5\' 7"'$  (1.702 m)   Wt 225 lb 3.2 oz (102.2 kg)   SpO2 97%   BMI 35.27 kg/m  Physical Exam Constitutional:      Appearance: Normal appearance.  HENT:     Head: Normocephalic and atraumatic.  Cardiovascular:     Rate and Rhythm: Normal rate and regular rhythm.  Pulmonary:     Effort: Pulmonary effort is normal.     Breath sounds: Normal breath sounds.  Musculoskeletal:        General: Normal range of motion.  Skin:    General: Skin is warm and dry.  Neurological:     General: No focal deficit present.     Mental Status: He is alert and oriented to person, place, and time. Mental status is at baseline.  Psychiatric:        Mood and Affect: Mood normal.        Behavior: Behavior normal.        Thought Content: Thought content normal.        Judgment: Judgment normal.      Lab Results:  CBC    Component Value Date/Time   WBC 5.8 12/17/2021 1108   RBC 4.79 12/17/2021 1108   HGB 15.9 12/17/2021 1108   HCT 47.7 12/17/2021 1108   PLT 119.0 (L) 12/17/2021 1108   MCV 99.7 12/17/2021 1108   MCH 34.1 (H) 06/12/2020 1218   MCHC 33.4 12/17/2021 1108   RDW 13.4 12/17/2021 1108   LYMPHSABS 1.3 08/30/2016 0927   MONOABS 0.5 08/30/2016 0927   EOSABS 0.3 08/30/2016 0927   BASOSABS 0.0 08/30/2016 0927    BMET    Component Value Date/Time   NA 141 12/17/2021 1108   K 4.2 12/17/2021 1108   CL 107 12/17/2021 1108   CO2 27 12/17/2021 1108   GLUCOSE 106 (H) 12/17/2021 1108   BUN 20 12/17/2021 1108   CREATININE 1.05 12/17/2021  1108   CALCIUM 9.2 12/17/2021 1108   GFRNONAA >60 06/12/2020 1332   GFRAA 90 04/05/2007 0913    BNP No results found for: "BNP"  ProBNP No results found for: "PROBNP"  Imaging: No results found.   Assessment & Plan:   Obstructive sleep apnea -  HST 11/18/13 >> AHI 64.9, SaO2 low 67%.  Maintained on auto CPAP. Patient is 91% compliant with use greater than 4 hours. Current pressure 10-20cm h20; residual AHI 1.5/hr. No changes needed today. Due for new CPAP machine, we will place an order with [facility]. Advised patient to wear CPAP every night for 4-6 hours or longer. Continue weight loss efforts. FU in 1 year or sooner if needed.    Martyn Ehrich, NP 12/24/2021

## 2021-12-28 ENCOUNTER — Telehealth: Payer: Self-pay | Admitting: Primary Care

## 2021-12-29 NOTE — Telephone Encounter (Signed)
Called and spoke with Brad at [facility] to see if they received the order for CPAP that was placed on 12/24/2021. He confirmed they got it and he was on vacation last week and processed it today. I have called and spoke with patient to let him know the above information. He expressed understanding and I also provided him with number for [employer]. Nothing further needed at this time.

## 2022-01-10 ENCOUNTER — Telehealth: Payer: Self-pay | Admitting: Internal Medicine

## 2022-01-10 NOTE — Telephone Encounter (Signed)
He's in the donut hole, so all meds will be expensive. We could try patient assistance but that does not help him currently.

## 2022-01-11 MED ORDER — GLIPIZIDE ER 5 MG PO TB24: 5.0000 mg | ORAL_TABLET | Freq: Every day | ORAL | 3 refills | Status: DC

## 2022-02-16 DIAGNOSIS — E119 Type 2 diabetes mellitus without complications: Secondary | ICD-10-CM | POA: Diagnosis not present

## 2022-02-16 DIAGNOSIS — H5213 Myopia, bilateral: Secondary | ICD-10-CM | POA: Diagnosis not present

## 2022-02-16 DIAGNOSIS — H35033 Hypertensive retinopathy, bilateral: Secondary | ICD-10-CM | POA: Diagnosis not present

## 2022-02-16 LAB — HM DIABETES EYE EXAM

## 2022-02-22 ENCOUNTER — Other Ambulatory Visit: Payer: Self-pay

## 2022-02-22 NOTE — Patient Outreach (Signed)
  Care Coordination   02/22/2022 Name: Anthony Hunt MRN: [id number] DOB: 1951/06/08   Care Coordination Outreach Attempts:  An unsuccessful telephone outreach was attempted today to offer the patient information about available care coordination services as a benefit of their health plan.   Follow Up Plan:  Additional outreach attempts will be made to offer the patient care coordination information and services.   Encounter Outcome:  No Answer  Care Coordination Interventions Activated:  No   Care Coordination Interventions:  No, not indicated    Quinn Plowman RN,BSN,CCM RN Care Manager Coordinator [phone]

## 2022-03-08 IMAGING — DX DG HIP (WITH OR WITHOUT PELVIS) 4+V*R*
3 series · 3 of 3 positions shown · non-contrast
Comparison: None.

CLINICAL DATA: Right hip pain

EXAM:
DG HIP (WITH OR WITHOUT PELVIS) 4+V RIGHT

[pelvis ap]
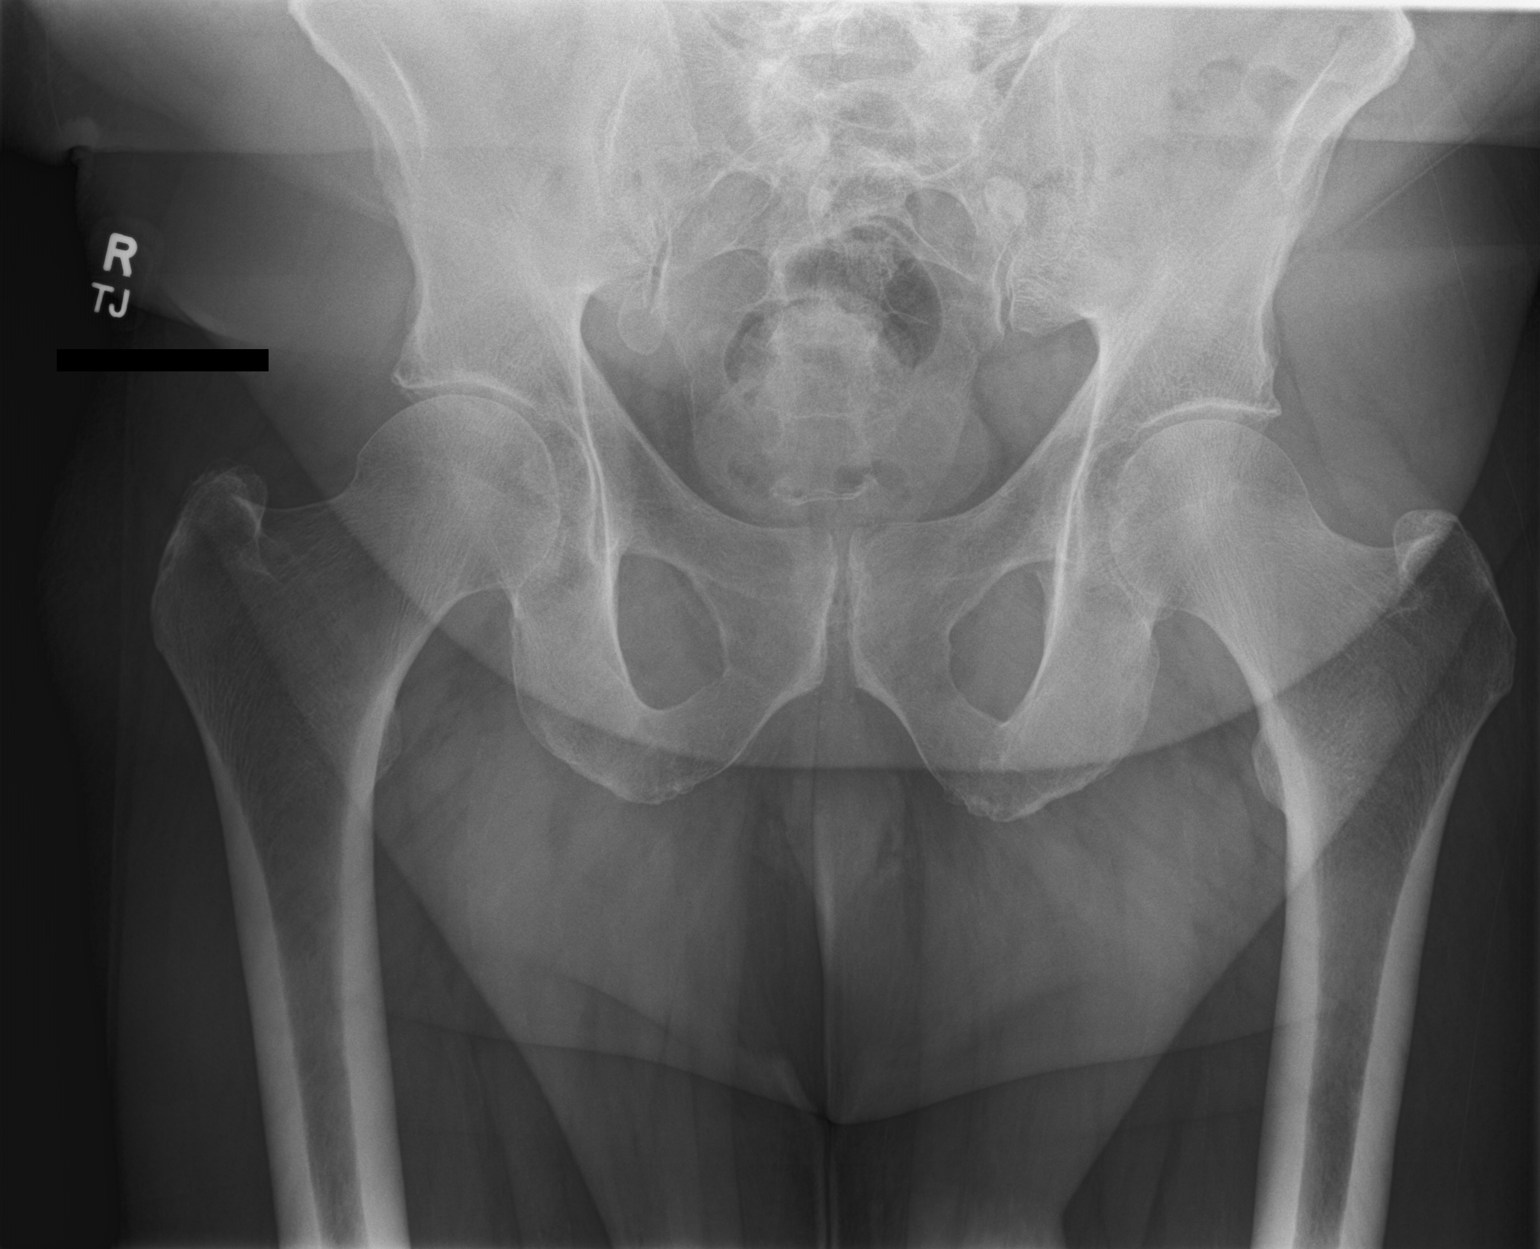

[hip ap]
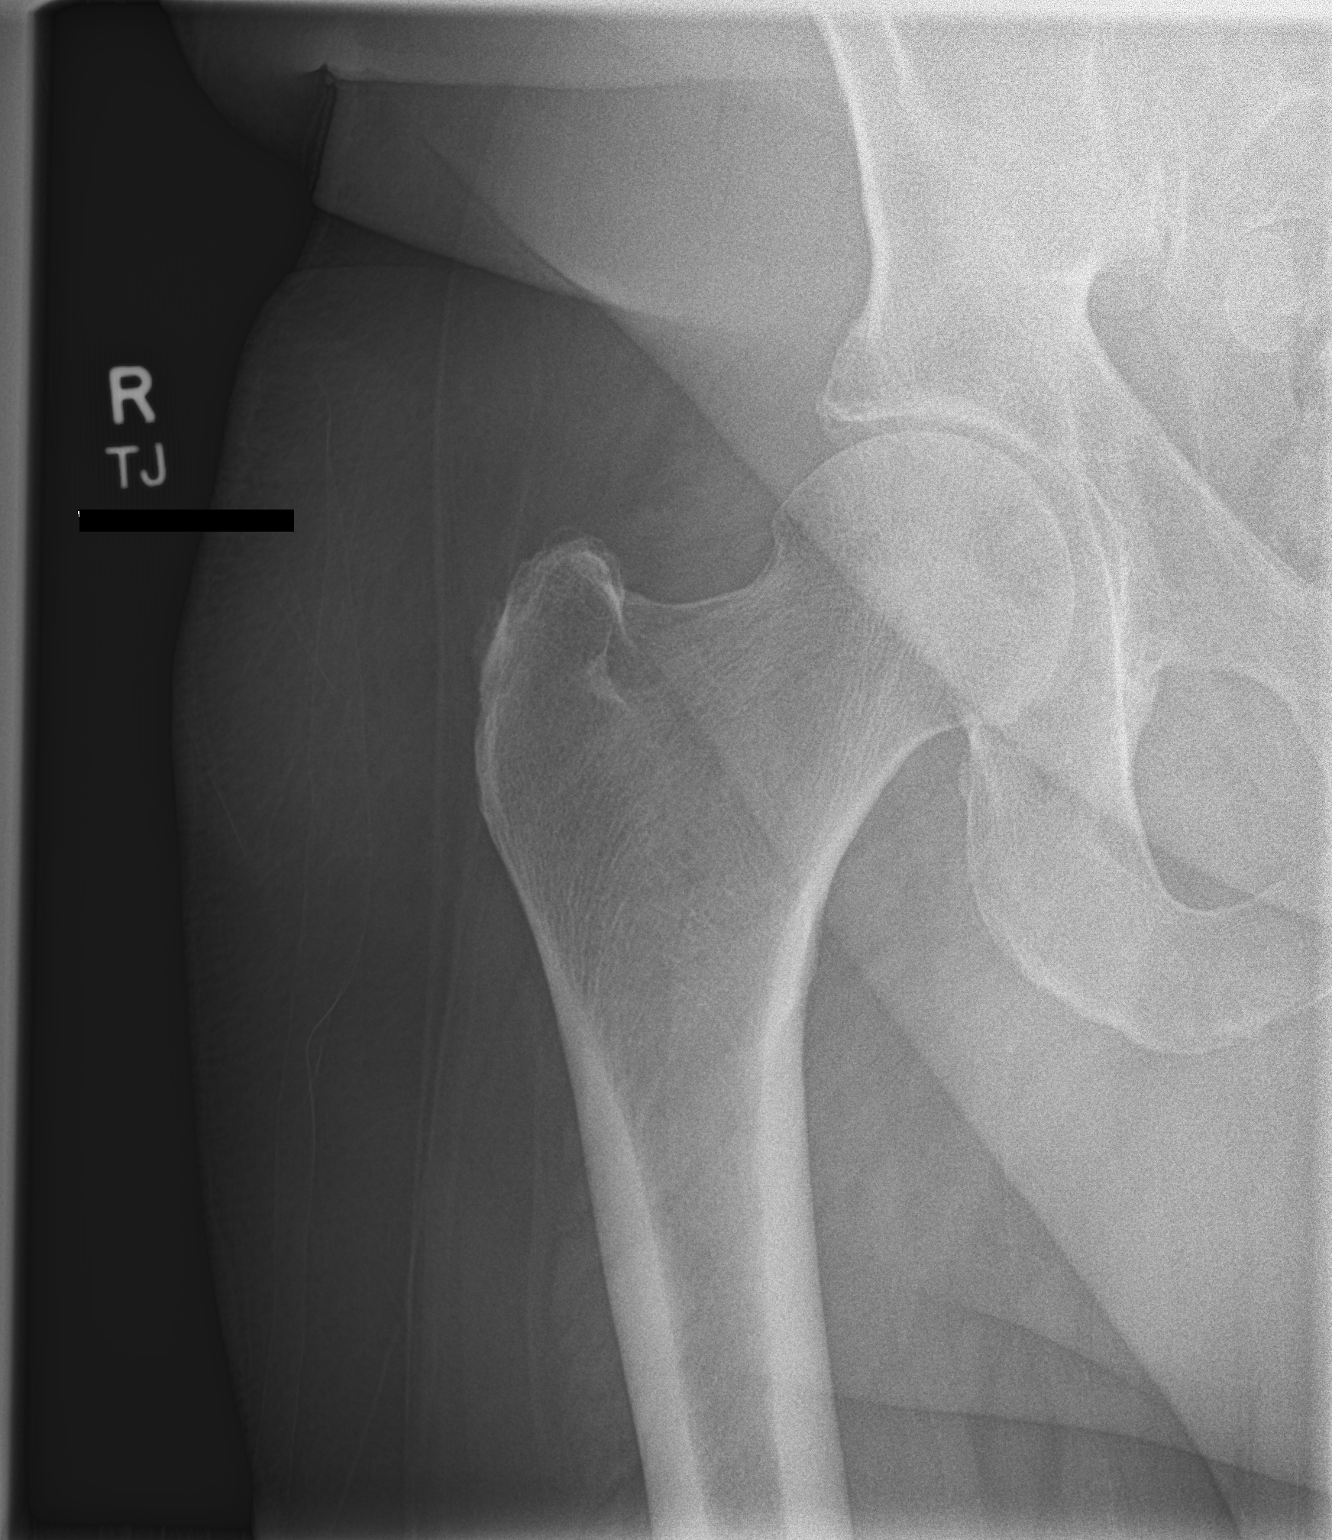

[hip lat]
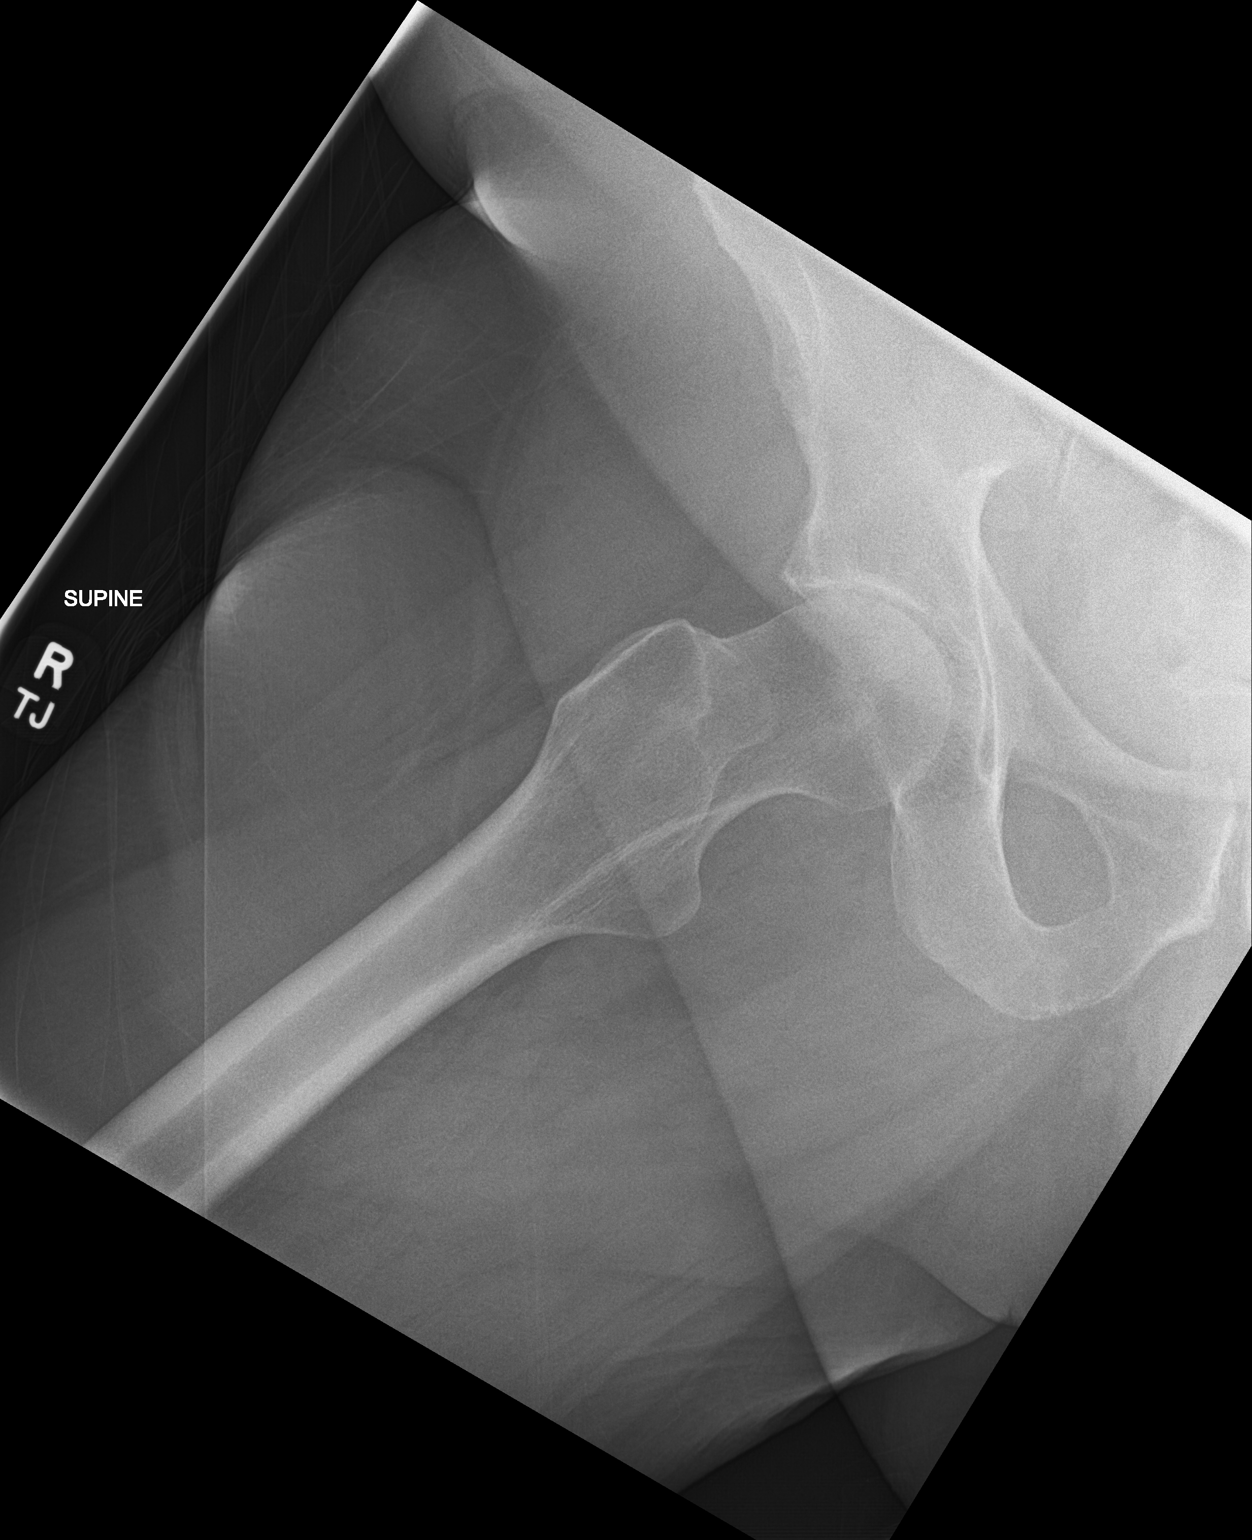

[3 of 3 positions shown; findings below may reference images not displayed]

FINDINGS: There is no evidence of hip fracture or dislocation. There is no
evidence of arthropathy or other focal bone abnormality.
IMPRESSION: Negative.

## 2022-03-10 ENCOUNTER — Ambulatory Visit: Payer: Self-pay

## 2022-03-10 NOTE — Patient Outreach (Signed)
  Care Coordination   03/10/2022 Name: KOHLER PELLERITO MRN: [id number] DOB: [birth year]   Care Coordination Outreach Attempts:  A second unsuccessful outreach was attempted today to offer the patient with information about available care coordination services as a benefit of their health plan.     Follow Up Plan:  Additional outreach attempts will be made to offer the patient care coordination information and services.   Encounter Outcome:  No Answer  Care Coordination Interventions Activated:  No   Care Coordination Interventions:  No, not indicated    Quinn Plowman RN,BSN,CCM RN Care Manager Coordinator [phone]

## 2022-03-17 ENCOUNTER — Ambulatory Visit: Payer: Self-pay

## 2022-03-17 NOTE — Patient Outreach (Addendum)
  Care Coordination   Initial Visit Note   03/17/2022 Name: Anthony Hunt MRN: [id number] DOB: 1951/01/18  Anthony Hunt is a 71 y.o. year old male who sees Venia Carbon, MD for primary care. I spoke with  Encarnacion Chu by phone today.  What matters to the patients health and wellness today?  Patient states he does not have any nursing or community resource needs.      Goals Addressed             This Visit's Progress    COMPLETED: Care coordination activities - no further follow up needed.       Care Coordination Interventions: Case management program / services discussed.  Advised to contact provider for referral if services needed in the future.  Discussed annual wellness visit.  Advised to contract provider office to schedule or clarify if recent visit in June 2023 was for annual wellness.           SDOH assessments and interventions completed:  No     Care Coordination Interventions Activated:  Yes  Care Coordination Interventions:  Yes, provided   Follow up plan: No further intervention required.   Encounter Outcome:  Pt. Visit Completed   Quinn Plowman RN,BSN,CCM [place] Coordinator [phone]

## 2022-03-17 NOTE — Patient Instructions (Signed)
Visit Information  Thank you for taking time to visit with me today. Please don't hesitate to contact me if I can be of assistance to you.   Following are the goals we discussed today:   Goals Addressed             This Visit's Progress    Care coordination activities - no further follow up needed.       Care Coordination Interventions: Case management program / services discussed.  Advised to contact provider for referral if services needed in the future.  Discussed annual wellness visit.  Advised to contract provider office to schedule or clarify if recent visit in June 2023 was for annual wellness.           If you are experiencing a Mental Health or Bostic or need someone to talk to, please call 1-800-273-TALK (toll free, 24 hour hotline)  Patient verbalizes understanding of instructions and care plan provided today and agrees to view in [place]. Active MyChart status and patient understanding of how to access instructions and care plan via MyChart confirmed with patient.     No further follow up required:     Anthony Hunt [facility] [place] Coordinator [phone]

## 2022-03-24 ENCOUNTER — Other Ambulatory Visit: Payer: Self-pay | Admitting: Internal Medicine

## 2022-03-28 ENCOUNTER — Ambulatory Visit (INDEPENDENT_AMBULATORY_CARE_PROVIDER_SITE_OTHER): Payer: Medicare HMO | Admitting: Family

## 2022-03-28 ENCOUNTER — Encounter: Payer: Self-pay | Admitting: Family

## 2022-03-28 VITALS — BP 148/70 | HR 65 | Temp 97.3°F | Wt 235.0 lb

## 2022-03-28 DIAGNOSIS — B0229 Other postherpetic nervous system involvement: Secondary | ICD-10-CM | POA: Diagnosis not present

## 2022-03-28 DIAGNOSIS — B027 Disseminated zoster: Secondary | ICD-10-CM | POA: Insufficient documentation

## 2022-03-28 MED ORDER — GABAPENTIN 300 MG PO CAPS: ORAL_CAPSULE | ORAL | 0 refills | Status: DC

## 2022-03-28 MED ORDER — GABAPENTIN 300 MG PO CAPS: ORAL_CAPSULE | ORAL | 3 refills | Status: DC

## 2022-03-28 MED ORDER — VALACYCLOVIR HCL 1 G PO TABS: 1000.0000 mg | ORAL_TABLET | Freq: Three times a day (TID) | ORAL | 0 refills | Status: AC

## 2022-03-28 NOTE — Assessment & Plan Note (Signed)
Pt advised of the following: Based off of symptoms suspected zoster.  Start Valtrex 1 g 3 times daily x7 days.  Monitor for signs and symptoms of rash development and then.Please monitor site for worsening signs/symptoms of infection to include: increasing redness, increasing tenderness, increase in size, and or pustulant drainage from site. If this is to occur please let me know immediately.

## 2022-03-28 NOTE — Patient Instructions (Signed)
Sending in valltrex to get started on. If no improvement please follow up   Due to recent changes in healthcare laws, you may see results of your imaging and/or laboratory studies on MyChart before I have had a chance to review them.  I understand that in some cases there may be results that are confusing or concerning to you. Please understand that not all results are received at the same time and often I may need to interpret multiple results in order to provide you with the best plan of care or course of treatment. Therefore, I ask that you please give me 2 business days to thoroughly review all your results before contacting my office for clarification. Should we see a critical lab result, you will be contacted sooner.   It was a pleasure seeing you today! Please do not hesitate to reach out with any questions and or concerns.  Regards,   Eugenia Pancoast FNP-C

## 2022-03-28 NOTE — Progress Notes (Signed)
Established Patient Office Visit  Subjective:  Patient ID: Anthony Hunt, male    DOB: 12/29/1950  Age: 71 y.o. MRN: [id number]  CC:  Chief Complaint  Patient presents with   Tingling    Left lower back feels like pens and needles when anything comes in contact. Symptoms started 9/8    HPI Anthony Hunt is here today with concerns.   Four days ago started with left lower back and left lower abdominal pain that feels like pins and needles. Suspected shingles. He has had shinglex x 4-5 times.   No rash seen yet.   Past Medical History:  Diagnosis Date   Allergy    Bright's disease    age [age]  no issues since    CAD (coronary artery disease) 2005   MI   Hyperlipidemia    Hypertension    Myocardial infarction ([place]) 2005   Pre-diabetes    Sleep apnea    Uses Cpap    Past Surgical History:  Procedure Laterality Date   CERVICAL DISC SURGERY     CORONARY STENT PLACEMENT  2005   ULNAR NERVE REPAIR     Left ulnar nerve decompression 02/07   US ECHOCARDIOGRAPHY      mild inf hypokinesis, EF 55%  09/05    Family History  Problem Relation Age of Onset   Coronary artery disease Mother    Peripheral vascular disease Mother    Cancer Maternal Uncle    Cancer Paternal Aunt    Diabetes Neg Hx    Hypertension Neg Hx    Colon cancer Neg Hx    Colon polyps Neg Hx    Esophageal cancer Neg Hx    Rectal cancer Neg Hx    Stomach cancer Neg Hx    Bladder Cancer Neg Hx    Prostate cancer Neg Hx    Kidney cancer Neg Hx     Social History   Socioeconomic History   Marital status: Married    Spouse name: Not on file   Number of children: 1   Years of education: Not on file   Highest education level: Not on file  Occupational History   Occupation: [employer]    Comment: Retired 10/22   Occupation: Leisure centre manager    Comment: Scientist, product/process development  Tobacco Use   Smoking status: Never    Passive exposure: Past   Smokeless tobacco: Current    Types: Chew   Tobacco  comments:    Occasionally, couple times a week (on the weekends).  12/24/2021 hfb  Vaping Use   Vaping Use: Never used  Substance and Sexual Activity   Alcohol use: No    Alcohol/week: 0.0 standard drinks of alcohol   Drug use: No   Sexual activity: Not on file  Other Topics Concern   Not on file  Social History Narrative   No living will   Wife would be health care POA_--alternate is son   Would accept resuscitation attempts   Not sure about tube feeds   Social Determinants of Health   Financial Resource Strain: Not on file  Food Insecurity: Not on file  Transportation Needs: Not on file  Physical Activity: Not on file  Stress: Not on file  Social Connections: Not on file  Intimate Partner Violence: Not on file    Outpatient Medications Prior to Visit  Medication Sig Dispense Refill   aspirin 81 MG tablet Take 81 mg by mouth daily.     benazepril (LOTENSIN)  10 MG tablet TAKE 1 TABLET EVERY DAY 90 tablet 3   glipiZIDE (GLUCOTROL XL) 5 MG 24 hr tablet Take 1 tablet (5 mg total) by mouth daily with breakfast. 90 tablet 3   glucose blood test strip Use to check blood sugar once a day. Dx Code E11.59 100 each 4   metFORMIN (GLUCOPHAGE-XR) 500 MG 24 hr tablet TAKE 2 TABLETS EVERY DAY WITH BREAKFAST 180 tablet 3   metoprolol succinate (TOPROL-XL) 25 MG 24 hr tablet TAKE 1 TABLET EVERY DAY 90 tablet 3   OneTouch Delica Lancets 78M MISC 1 each by Does not apply route daily. Use to check blood sugar daily. Dx Code E11.59 100 each 4   simvastatin (ZOCOR) 80 MG tablet TAKE 1 TABLET EVERY DAY  AT  6  PM 90 tablet 3   No facility-administered medications prior to visit.    No Known Allergies      Objective:    Physical Exam Constitutional:      Appearance: Normal appearance. He is obese.  Cardiovascular:     Rate and Rhythm: Normal rate and regular rhythm.  Pulmonary:     Effort: Pulmonary effort is normal.  Skin:    Findings: Rash present. Rash is papular (small red  papicular lesions on left lower abdominal quadrant and scant on left mid to lower back).  Neurological:     General: No focal deficit present.     Mental Status: He is alert and oriented to person, place, and time. Mental status is at baseline.     BP (!) 148/70   Pulse 65   Temp (!) 97.3 F (36.3 C) (Temporal)   Wt 235 lb (106.6 kg)   SpO2 96%   BMI 36.81 kg/m  Wt Readings from Last 3 Encounters:  03/28/22 235 lb (106.6 kg)  12/24/21 225 lb 3.2 oz (102.2 kg)  12/22/21 212 lb (96.2 kg)     Health Maintenance Due  Topic Date Due   Hepatitis C Screening  Never done   COLONOSCOPY (Pts 45-36yr Insurance coverage will need to be confirmed)  03/10/2022    There are no preventive care reminders to display for this patient.  Lab Results  Component Value Date   TSH 1.16 08/16/2013   Lab Results  Component Value Date   WBC 5.8 12/17/2021   HGB 15.9 12/17/2021   HCT 47.7 12/17/2021   MCV 99.7 12/17/2021   PLT 119.0 (L) 12/17/2021   Lab Results  Component Value Date   NA 141 12/17/2021   K 4.2 12/17/2021   CO2 27 12/17/2021   GLUCOSE 106 (H) 12/17/2021   BUN 20 12/17/2021   CREATININE 1.05 12/17/2021   BILITOT 1.0 12/17/2021   ALKPHOS 37 (L) 12/17/2021   AST 23 12/17/2021   ALT 28 12/17/2021   PROT 6.6 12/17/2021   ALBUMIN 4.1 12/17/2021   CALCIUM 9.2 12/17/2021   ANIONGAP 11 06/12/2020   GFR 72.78 12/17/2021   Lab Results  Component Value Date   HGBA1C 5.9 12/17/2021      Assessment & Plan:   Problem List Items Addressed This Visit       Other   Other postherpetic nervous system involvement    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  Prescription given for gabapentin 300 mg to use at night did advise patient this could cause fatigue and not to take if he is going to drive anywhere and do not take with other sedative medications.      Relevant Medications  gabapentin (NEURONTIN) 300 MG capsule   Disseminated herpes zoster - Primary    Pt advised of  the following: Based off of symptoms suspected zoster.  Start Valtrex 1 g 3 times daily x7 days.  Monitor for signs and symptoms of rash development and then.Please monitor site for worsening signs/symptoms of infection to include: increasing redness, increasing tenderness, increase in size, and or pustulant drainage from site. If this is to occur please let me know immediately.        Relevant Medications   valACYclovir (VALTREX) 1000 MG tablet   gabapentin (NEURONTIN) 300 MG capsule    Meds ordered this encounter  Medications   valACYclovir (VALTREX) 1000 MG tablet    Sig: Take 1 tablet (1,000 mg total) by mouth 3 (three) times daily for 7 days.    Dispense:  21 tablet    Refill:  0    Order Specific Question:   Supervising Provider    Answer:   BEDSOLE, AMY E [[id number]]   DISCONTD: gabapentin (NEURONTIN) 300 MG capsule    Sig: Take one po qhs for nerve pain    Dispense:  90 capsule    Refill:  3    Order Specific Question:   Supervising Provider    Answer:   BEDSOLE, AMY E [[id number]]   gabapentin (NEURONTIN) 300 MG capsule    Sig: Take one po qhs for nerve pain    Dispense:  30 capsule    Refill:  0    Order Specific Question:   Supervising Provider    Answer:   BEDSOLE, AMY E [[id number]]    Follow-up: No follow-ups on file.    Eugenia Pancoast, FNP

## 2022-03-28 NOTE — Assessment & Plan Note (Signed)
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  Prescription given for gabapentin 300 mg to use at night did advise patient this could cause fatigue and not to take if he is going to drive anywhere and do not take with other sedative medications.

## 2022-04-04 ENCOUNTER — Telehealth: Payer: Self-pay | Admitting: Internal Medicine

## 2022-04-04 NOTE — Telephone Encounter (Signed)
Spoke to pt. He said he is still having some pain but will keep taking the gabapentin at night. If he continues to have no improvement, he knows to call and schedule an appt with Dr Silvio Pate.

## 2022-04-04 NOTE — Telephone Encounter (Signed)
Patient calling in re: shingles, 9.18.23, they are some better  But needs more, taking last does today can call in another 7 day supply of valtrex?   valACYclovir (VALTREX) 1000 MG tablet  [id number] [address] [place]  [phone] Fax:  [phone]    SHubbard Robinsonon Monday 9.11.23

## 2022-04-04 NOTE — Telephone Encounter (Signed)
Pt did not have rash when he was in here for visit, treated suspecting shingles.  Why does he need more? Still ongoing pain? I suggest since no improvement we get him in with Dr. Silvio Pate.

## 2022-05-06 ENCOUNTER — Encounter: Payer: Self-pay | Admitting: Gastroenterology

## 2022-06-21 ENCOUNTER — Ambulatory Visit (INDEPENDENT_AMBULATORY_CARE_PROVIDER_SITE_OTHER): Payer: Medicare HMO | Admitting: Internal Medicine

## 2022-06-21 ENCOUNTER — Encounter: Payer: Self-pay | Admitting: Internal Medicine

## 2022-06-21 VITALS — BP 122/66 | HR 56 | Temp 97.2°F | Ht 67.0 in | Wt 226.0 lb

## 2022-06-21 DIAGNOSIS — I2510 Atherosclerotic heart disease of native coronary artery without angina pectoris: Secondary | ICD-10-CM

## 2022-06-21 DIAGNOSIS — J069 Acute upper respiratory infection, unspecified: Secondary | ICD-10-CM | POA: Diagnosis not present

## 2022-06-21 DIAGNOSIS — E1159 Type 2 diabetes mellitus with other circulatory complications: Secondary | ICD-10-CM | POA: Diagnosis not present

## 2022-06-21 LAB — POCT GLYCOSYLATED HEMOGLOBIN (HGB A1C): Hemoglobin A1C: 6.5 % — AB (ref 4.0–5.6)

## 2022-06-21 NOTE — Assessment & Plan Note (Signed)
Lab Results  Component Value Date   HGBA1C 6.5 (A) 06/21/2022   Control has slipped but still pretty good Can't afford rybelsus--so now on glipizide '5mg'$  and metformin 1000 bid

## 2022-06-21 NOTE — Progress Notes (Signed)
Subjective:    Patient ID: Anthony Hunt, male    DOB: Sep 27, 1950, 71 y.o.   MRN: [id number]  HPI Here for follow up of diabetes and has a cold  Rarely checks his sugars---"I can't get in the habit" Does try eat healthy No problems with the glipizide---no low sugar reactions  No chest pain No SOB No dizziness or syncope No edema  Started in past 2-3 with stuffy head Slight sore throat Only occ throat No fever No SOB  Current Outpatient Medications on File Prior to Visit  Medication Sig Dispense Refill   aspirin 81 MG tablet Take 81 mg by mouth daily.     benazepril (LOTENSIN) 10 MG tablet TAKE 1 TABLET EVERY DAY 90 tablet 3   glipiZIDE (GLUCOTROL XL) 5 MG 24 hr tablet Take 1 tablet (5 mg total) by mouth daily with breakfast. 90 tablet 3   glucose blood test strip Use to check blood sugar once a day. Dx Code E11.59 100 each 4   metFORMIN (GLUCOPHAGE-XR) 500 MG 24 hr tablet TAKE 2 TABLETS EVERY DAY WITH BREAKFAST 180 tablet 3   metoprolol succinate (TOPROL-XL) 25 MG 24 hr tablet TAKE 1 TABLET EVERY DAY 90 tablet 3   OneTouch Delica Lancets 62I MISC 1 each by Does not apply route daily. Use to check blood sugar daily. Dx Code E11.59 100 each 4   simvastatin (ZOCOR) 80 MG tablet TAKE 1 TABLET EVERY DAY  AT  6  PM 90 tablet 3   No current facility-administered medications on file prior to visit.    No Known Allergies  Past Medical History:  Diagnosis Date   Allergy    Bright's disease    age [age]  no issues since    CAD (coronary artery disease) 2005   MI   Hyperlipidemia    Hypertension    Myocardial infarction ([place]) 2005   Pre-diabetes    Sleep apnea    Uses Cpap    Past Surgical History:  Procedure Laterality Date   CERVICAL DISC SURGERY     CORONARY STENT PLACEMENT  2005   ULNAR NERVE REPAIR     Left ulnar nerve decompression 02/07   US ECHOCARDIOGRAPHY      mild inf hypokinesis, EF 55%  09/05    Family History  Problem Relation Age of Onset    Coronary artery disease Mother    Peripheral vascular disease Mother    Cancer Maternal Uncle    Cancer Paternal Aunt    Diabetes Neg Hx    Hypertension Neg Hx    Colon cancer Neg Hx    Colon polyps Neg Hx    Esophageal cancer Neg Hx    Rectal cancer Neg Hx    Stomach cancer Neg Hx    Bladder Cancer Neg Hx    Prostate cancer Neg Hx    Kidney cancer Neg Hx     Social History   Socioeconomic History   Marital status: Married    Spouse name: Not on file   Number of children: 1   Years of education: Not on file   Highest education level: Not on file  Occupational History   Occupation: [employer]    Comment: Retired 10/22   Occupation: Leisure centre manager    Comment: Scientist, product/process development  Tobacco Use   Smoking status: Never    Passive exposure: Past   Smokeless tobacco: Current    Types: Chew   Tobacco comments:    Occasionally, couple times  a week (on the weekends).  12/24/2021 hfb  Vaping Use   Vaping Use: Never used  Substance and Sexual Activity   Alcohol use: No    Alcohol/week: 0.0 standard drinks of alcohol   Drug use: No   Sexual activity: Not on file  Other Topics Concern   Not on file  Social History Narrative   No living will   Wife would be health care POA_--alternate is son   Would accept resuscitation attempts   Not sure about tube feeds   Social Determinants of Health   Financial Resource Strain: Not on file  Food Insecurity: Not on file  Transportation Needs: Not on file  Physical Activity: Not on file  Stress: Not on file  Social Connections: Not on file  Intimate Partner Violence: Not on file   Review of Systems Shingles is better Weight stable    Objective:   Physical Exam Constitutional:      Appearance: Normal appearance.  HENT:     Head:     Comments: No sinus tenderness    Right Ear: Tympanic membrane and ear canal normal.     Left Ear: Tympanic membrane and ear canal normal.     Mouth/Throat:     Pharynx: No oropharyngeal exudate or  posterior oropharyngeal erythema.  Cardiovascular:     Rate and Rhythm: Normal rate and regular rhythm.     Pulses: Normal pulses.     Heart sounds: No murmur heard.    No gallop.  Pulmonary:     Effort: Pulmonary effort is normal.     Breath sounds: Normal breath sounds. No wheezing or rales.  Musculoskeletal:     Cervical back: Neck supple.     Right lower leg: No edema.     Left lower leg: No edema.  Lymphadenopathy:     Cervical: No cervical adenopathy.  Skin:    Findings: No lesion or rash.  Neurological:     Mental Status: He is alert.  Psychiatric:        Mood and Affect: Mood normal.        Behavior: Behavior normal.            Assessment & Plan:

## 2022-06-21 NOTE — Assessment & Plan Note (Signed)
This is quiet on metoprolol '25mg'$ , simvastatin 80, ASA 81 and benazepril '10mg'$  daily

## 2022-06-21 NOTE — Assessment & Plan Note (Signed)
Seems to be self limited viral infection If worsens with more cough/drainage next week--would try empiric amoxil

## 2022-07-22 ENCOUNTER — Encounter: Payer: Self-pay | Admitting: Internal Medicine

## 2022-07-22 ENCOUNTER — Ambulatory Visit (INDEPENDENT_AMBULATORY_CARE_PROVIDER_SITE_OTHER): Payer: Medicare HMO | Admitting: Internal Medicine

## 2022-07-22 VITALS — BP 138/74 | HR 66 | Temp 97.2°F | Ht 67.0 in | Wt 229.0 lb

## 2022-07-22 DIAGNOSIS — R053 Chronic cough: Secondary | ICD-10-CM

## 2022-07-22 MED ORDER — DOXYCYCLINE HYCLATE 100 MG PO TABS: 100.0000 mg | ORAL_TABLET | Freq: Two times a day (BID) | ORAL | 0 refills | Status: DC

## 2022-07-22 NOTE — Assessment & Plan Note (Signed)
May just be post infectious vs low level lingering sinus infection He will continue OTC cough meds Will try doxy 100 bid x 7 days--just in case

## 2022-07-22 NOTE — Progress Notes (Signed)
Subjective:    Patient ID: Anthony Hunt, male    DOB: March 13, 1951, 72 y.o.   MRN: [id number]  HPI Here due to persistent cough  Started about a month ago Seemed to improve---then he has had ongoing "nasty cough" Not sick Very little drainage Some headache---occipital Mild sinus pressure No ear pain or pressure No fever  Using OTC cough med---seems to help Worse at night No SOB  Current Outpatient Medications on File Prior to Visit  Medication Sig Dispense Refill   aspirin 81 MG tablet Take 81 mg by mouth daily.     benazepril (LOTENSIN) 10 MG tablet TAKE 1 TABLET EVERY DAY 90 tablet 3   glipiZIDE (GLUCOTROL XL) 5 MG 24 hr tablet Take 1 tablet (5 mg total) by mouth daily with breakfast. 90 tablet 3   glucose blood test strip Use to check blood sugar once a day. Dx Code E11.59 100 each 4   metFORMIN (GLUCOPHAGE-XR) 500 MG 24 hr tablet TAKE 2 TABLETS EVERY DAY WITH BREAKFAST 180 tablet 3   metoprolol succinate (TOPROL-XL) 25 MG 24 hr tablet TAKE 1 TABLET EVERY DAY 90 tablet 3   OneTouch Delica Lancets 85U MISC 1 each by Does not apply route daily. Use to check blood sugar daily. Dx Code E11.59 100 each 4   simvastatin (ZOCOR) 80 MG tablet TAKE 1 TABLET EVERY DAY  AT  6  PM 90 tablet 3   No current facility-administered medications on file prior to visit.    No Known Allergies  Past Medical History:  Diagnosis Date   Allergy    Bright's disease    age [age]  no issues since    CAD (coronary artery disease) 2005   MI   Hyperlipidemia    Hypertension    Myocardial infarction ([place]) 2005   Pre-diabetes    Sleep apnea    Uses Cpap    Past Surgical History:  Procedure Laterality Date   CERVICAL DISC SURGERY     CORONARY STENT PLACEMENT  2005   ULNAR NERVE REPAIR     Left ulnar nerve decompression 02/07   US ECHOCARDIOGRAPHY      mild inf hypokinesis, EF 55%  09/05    Family History  Problem Relation Age of Onset   Coronary artery disease Mother    Peripheral  vascular disease Mother    Cancer Maternal Uncle    Cancer Paternal Aunt    Diabetes Neg Hx    Hypertension Neg Hx    Colon cancer Neg Hx    Colon polyps Neg Hx    Esophageal cancer Neg Hx    Rectal cancer Neg Hx    Stomach cancer Neg Hx    Bladder Cancer Neg Hx    Prostate cancer Neg Hx    Kidney cancer Neg Hx     Social History   Socioeconomic History   Marital status: Married    Spouse name: Not on file   Number of children: 1   Years of education: Not on file   Highest education level: Not on file  Occupational History   Occupation: [employer]    Comment: Retired 10/22   Occupation: Leisure centre manager    Comment: Scientist, product/process development  Tobacco Use   Smoking status: Never    Passive exposure: Past   Smokeless tobacco: Current    Types: Chew   Tobacco comments:    Occasionally, couple times a week (on the weekends).  12/24/2021 hfb  Vaping Use  Vaping Use: Never used  Substance and Sexual Activity   Alcohol use: No    Alcohol/week: 0.0 standard drinks of alcohol   Drug use: No   Sexual activity: Not on file  Other Topics Concern   Not on file  Social History Narrative   No living will   Wife would be health care POA_--alternate is son   Would accept resuscitation attempts   Not sure about tube feeds   Social Determinants of Health   Financial Resource Strain: Not on file  Food Insecurity: Not on file  Transportation Needs: Not on file  Physical Activity: Not on file  Stress: Not on file  Social Connections: Not on file  Intimate Partner Violence: Not on file   Review of Systems No N/V Eating okay Hasn't missed work     Objective:   Physical Exam Constitutional:      Appearance: Normal appearance.  HENT:     Right Ear: Tympanic membrane and ear canal normal.     Left Ear: Tympanic membrane and ear canal normal.     Mouth/Throat:     Pharynx: No oropharyngeal exudate or posterior oropharyngeal erythema.  Pulmonary:     Effort: Pulmonary effort is  normal.     Breath sounds: Normal breath sounds. No wheezing or rales.  Musculoskeletal:     Cervical back: Neck supple.  Lymphadenopathy:     Cervical: No cervical adenopathy.  Neurological:     Mental Status: He is alert.            Assessment & Plan:

## 2022-08-05 ENCOUNTER — Encounter: Payer: Self-pay | Admitting: Urology

## 2022-10-30 ENCOUNTER — Other Ambulatory Visit: Payer: Self-pay | Admitting: Internal Medicine

## 2022-12-21 ENCOUNTER — Encounter: Payer: BC Managed Care – PPO | Admitting: Internal Medicine

## 2022-12-23 ENCOUNTER — Other Ambulatory Visit: Payer: Medicare HMO

## 2022-12-23 DIAGNOSIS — R972 Elevated prostate specific antigen [PSA]: Secondary | ICD-10-CM

## 2022-12-24 LAB — FPSA% REFLEX
% FREE PSA: 26.5 %
PSA, FREE: 1.27 ng/mL

## 2022-12-24 LAB — PSA TOTAL (REFLEX TO FREE): Prostate Specific Ag, Serum: 4.8 ng/mL — ABNORMAL HIGH (ref 0.0–4.0)

## 2022-12-28 ENCOUNTER — Ambulatory Visit: Payer: Medicare HMO | Admitting: Urology

## 2022-12-28 ENCOUNTER — Encounter: Payer: Self-pay | Admitting: Internal Medicine

## 2022-12-28 ENCOUNTER — Ambulatory Visit (INDEPENDENT_AMBULATORY_CARE_PROVIDER_SITE_OTHER): Payer: Medicare HMO | Admitting: Internal Medicine

## 2022-12-28 VITALS — BP 130/72 | HR 57 | Temp 97.5°F | Ht 67.5 in | Wt 225.0 lb

## 2022-12-28 DIAGNOSIS — D696 Thrombocytopenia, unspecified: Secondary | ICD-10-CM | POA: Diagnosis not present

## 2022-12-28 DIAGNOSIS — G4733 Obstructive sleep apnea (adult) (pediatric): Secondary | ICD-10-CM | POA: Diagnosis not present

## 2022-12-28 DIAGNOSIS — Z0000 Encounter for general adult medical examination without abnormal findings: Secondary | ICD-10-CM

## 2022-12-28 DIAGNOSIS — E1159 Type 2 diabetes mellitus with other circulatory complications: Secondary | ICD-10-CM

## 2022-12-28 DIAGNOSIS — I2510 Atherosclerotic heart disease of native coronary artery without angina pectoris: Secondary | ICD-10-CM | POA: Diagnosis not present

## 2022-12-28 LAB — LIPID PANEL
Cholesterol: 122 mg/dL (ref 0–200)
HDL: 34.1 mg/dL — ABNORMAL LOW (ref >39.00)
LDL Cholesterol: 50 mg/dL (ref 0–99)
NonHDL: 88.37
Total CHOL/HDL Ratio: 4
Triglycerides: 193 mg/dL — ABNORMAL HIGH (ref 0.0–149.0)
VLDL: 38.6 mg/dL (ref 0.0–40.0)

## 2022-12-28 LAB — HM DIABETES FOOT EXAM

## 2022-12-28 LAB — COMPREHENSIVE METABOLIC PANEL
ALT: 34 U/L (ref 0–53)
AST: 35 U/L (ref 0–37)
Albumin: 4.1 g/dL (ref 3.5–5.2)
Alkaline Phosphatase: 36 U/L — ABNORMAL LOW (ref 39–117)
BUN: 19 mg/dL (ref 6–23)
CO2: 26 mEq/L (ref 19–32)
Calcium: 9.5 mg/dL (ref 8.4–10.5)
Chloride: 104 mEq/L (ref 96–112)
Creatinine, Ser: 1.29 mg/dL (ref 0.40–1.50)
GFR: 56.44 mL/min — ABNORMAL LOW (ref >60.00)
Glucose, Bld: 289 mg/dL — ABNORMAL HIGH (ref 70–99)
Potassium: 4.5 mEq/L (ref 3.5–5.1)
Sodium: 138 mEq/L (ref 135–145)
Total Bilirubin: 0.7 mg/dL (ref 0.2–1.2)
Total Protein: 6.9 g/dL (ref 6.0–8.3)

## 2022-12-28 LAB — MICROALBUMIN / CREATININE URINE RATIO
Creatinine,U: 130 mg/dL
Microalb Creat Ratio: 0.5 mg/g (ref 0.0–30.0)
Microalb, Ur: <0.7 mg/dL (ref 0.0–1.9)

## 2022-12-28 LAB — CBC
HCT: 46.7 % (ref 39.0–52.0)
Hemoglobin: 15.6 g/dL (ref 13.0–17.0)
MCHC: 33.5 g/dL (ref 30.0–36.0)
MCV: 99.9 fl (ref 78.0–100.0)
Platelets: 117 10*3/uL — ABNORMAL LOW (ref 150.0–400.0)
RBC: 4.67 Mil/uL (ref 4.22–5.81)
RDW: 13.2 % (ref 11.5–15.5)
WBC: 6.2 10*3/uL (ref 4.0–10.5)

## 2022-12-28 LAB — HEMOGLOBIN A1C: Hgb A1c MFr Bld: 9.9 % — ABNORMAL HIGH (ref 4.6–6.5)

## 2022-12-28 NOTE — Progress Notes (Signed)
Hearing Screening - Comments:: Passed whisper test Vision Screening - Comments:: August 2023  

## 2022-12-28 NOTE — Assessment & Plan Note (Signed)
Uses the CPAP nightly 

## 2022-12-28 NOTE — Assessment & Plan Note (Signed)
Has slacked off on checking--urged him to check at least occasionally Seems to be okay on metformin 1000 daily, glipizide 5mg 

## 2022-12-28 NOTE — Assessment & Plan Note (Signed)
I have personally reviewed the Medicare Annual Wellness questionnaire and have noted 1. The patient's medical and social history 2. Their use of alcohol, tobacco or illicit drugs 3. Their current medications and supplements 4. The patient's functional ability including ADL's, fall risks, home safety risks and hearing or visual             impairment. 5. Diet and physical activities 6. Evidence for depression or mood disorders  The patients weight, height, BMI and visual acuity have been recorded in the chart I have made referrals, counseling and provided education to the patient based review of the above and I have provided the pt with a written personalized care plan for preventive services.  I have provided you with a copy of your personalized plan for preventive services. Please take the time to review along with your updated medication list.  Scheduling repeat colonoscopy now Following with urology for elevated PSA Really needs to do some exercise Flu/COVID/RSV in the fall

## 2022-12-28 NOTE — Progress Notes (Signed)
Subjective:    Patient ID: Anthony Hunt, male    DOB: 01-16-1951, 72 y.o.   MRN: [id number]  HPI Here for Medicare wellness visit and follow up of chronic health conditions Reviewed advanced directives Reviewed other [facility], Dr Sninsky--urology, Dr Malva Limes, [facility] No surgery or hospitalizations in the past year Not really exercising Vision is okay Hearing is fine No alcohol  Chews tobacco once a week or so No falls No depression or anhedonia Independent with instrumental ADLs No sig memory issues  Rarely checking sugars lately Typically 120 or so fasting No foot or hand numbness or burning  No chest pain No SOB or change in exercise tolerance--though not doing much No dizziness or syncope No edema No palpitations  upcoming follow up with urology PSA is down Voids okay---stream is okay. Empties okay  Low platelets for some time No abnormal bleeding or bruising  Current Outpatient Medications on File Prior to Visit  Medication Sig Dispense Refill   aspirin 81 MG tablet Take 81 mg by mouth daily.     benazepril (LOTENSIN) 10 MG tablet TAKE 1 TABLET EVERY DAY 90 tablet 3   glipiZIDE (GLUCOTROL XL) 5 MG 24 hr tablet TAKE 1 TABLET EVERY DAY WITH BREAKFAST 90 tablet 3   glucose blood test strip Use to check blood sugar once a day. Dx Code E11.59 100 each 4   metFORMIN (GLUCOPHAGE-XR) 500 MG 24 hr tablet TAKE 2 TABLETS EVERY DAY WITH BREAKFAST 180 tablet 3   metoprolol succinate (TOPROL-XL) 25 MG 24 hr tablet TAKE 1 TABLET EVERY DAY 90 tablet 3   OneTouch Delica Lancets 30G MISC 1 each by Does not apply route daily. Use to check blood sugar daily. Dx Code E11.59 100 each 4   simvastatin (ZOCOR) 80 MG tablet TAKE 1 TABLET EVERY DAY  AT  6  PM 90 tablet 3   No current facility-administered medications on file prior to visit.    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 22-6  no issues since    CAD (coronary artery disease) 2005   MI   Hyperlipidemia    Hypertension    Myocardial infarction (HCC) 2005   Pre-diabetes    Sleep apnea    Uses Cpap    Past Surgical History:  Procedure Laterality Date   CERVICAL DISC SURGERY     CORONARY STENT PLACEMENT  2005   ULNAR NERVE REPAIR     Left ulnar nerve decompression 02/07   US ECHOCARDIOGRAPHY      mild inf hypokinesis, EF 55%  09/05    Family History  Problem Relation Age of Onset   Coronary artery disease Mother    Peripheral vascular disease Mother    Cancer Maternal Uncle    Cancer Paternal Aunt    Diabetes Neg Hx    Hypertension Neg Hx    Colon cancer Neg Hx    Colon polyps Neg Hx    Esophageal cancer Neg Hx    Rectal cancer Neg Hx    Stomach cancer Neg Hx    Bladder Cancer Neg Hx    Prostate cancer Neg Hx    Kidney cancer Neg Hx     Social History   Socioeconomic History   Marital status: Married    Spouse name: Not on file   Number of children: 1   Years of education: Not on file  Highest education level: Not on file  Occupational History   Occupation: [employer]    Comment: Retired 10/22   Occupation: Invoice work/porter    Comment: Magazine features editor  Tobacco Use   Smoking status: Never    Passive exposure: Past   Smokeless tobacco: Current    Types: Chew   Tobacco comments:    Occasionally, couple times a week (on the weekends).  12/24/2021 hfb  Vaping Use   Vaping Use: Never used  Substance and Sexual Activity   Alcohol use: No    Alcohol/week: 0.0 standard drinks of alcohol   Drug use: No   Sexual activity: Not on file  Other Topics Concern   Not on file  Social History Narrative   No living will   Wife would be health care POA_--alternate is son   Would accept resuscitation attempts   Not sure about tube feeds   Social Determinants of Health   Financial Resource Strain: Not on file  Food Insecurity: Not on file  Transportation Needs: Not on  file  Physical Activity: Not on file  Stress: Not on file  Social Connections: Not on file  Intimate Partner Violence: Not on file   Review of Systems Appetite is fine Weight is stable Teeth okay--keeps up with dentist Wears seat belt Sleeps okay---uses CPAP nightly Occasional heartburn--tums will help (rarely). No dysphagia Bowels are fine--no blood No sig back or joint pains No suspicious skin lesions---no derm     Objective:   Physical Exam Constitutional:      Appearance: Normal appearance.  HENT:     Mouth/Throat:     Pharynx: No oropharyngeal exudate or posterior oropharyngeal erythema.  Eyes:     Conjunctiva/sclera: Conjunctivae normal.     Pupils: Pupils are equal, round, and reactive to light.  Cardiovascular:     Rate and Rhythm: Normal rate and regular rhythm.     Pulses: Normal pulses.     Heart sounds: No murmur heard.    No gallop.  Pulmonary:     Effort: Pulmonary effort is normal.     Breath sounds: Normal breath sounds. No wheezing or rales.  Abdominal:     Palpations: Abdomen is soft.     Tenderness: There is no abdominal tenderness.  Musculoskeletal:     Cervical back: Neck supple.     Right lower leg: No edema.     Left lower leg: No edema.  Lymphadenopathy:     Cervical: No cervical adenopathy.  Skin:    Findings: No lesion or rash.     Comments: No foot lesions  Neurological:     General: No focal deficit present.     Mental Status: He is alert and oriented to person, place, and time.     Comments: Word naming 6/1 minute Recall 2/3 Normal sensation in feet  Psychiatric:        Mood and Affect: Mood normal.        Behavior: Behavior normal.            Assessment & Plan:

## 2022-12-28 NOTE — Assessment & Plan Note (Signed)
Mild and stable Will recheck

## 2022-12-28 NOTE — Assessment & Plan Note (Signed)
No angina but needs to get in better shape On simvastatin 80, ASA 81, benazepril 10mg  daily, metoprolol 25 daily

## 2022-12-30 ENCOUNTER — Telehealth: Payer: Self-pay

## 2022-12-30 NOTE — Telephone Encounter (Signed)
Left message to call office

## 2022-12-30 NOTE — Telephone Encounter (Signed)
-----   Message from Karie Schwalbe, MD sent at 12/29/2022  7:58 AM EDT ----- Results released Diabetes out of control  Send jardiance 10mg  daily (#90 x 3). Let him know he will void more often on this Set up follow up in 3 months to recheck

## 2023-01-03 NOTE — Telephone Encounter (Signed)
Patient returned call to the office regarding this, would like a call back whenever possible.

## 2023-01-03 NOTE — Telephone Encounter (Signed)
LMTCB

## 2023-01-04 ENCOUNTER — Ambulatory Visit: Payer: Medicare HMO | Admitting: Urology

## 2023-01-04 ENCOUNTER — Encounter: Payer: Self-pay | Admitting: Urology

## 2023-01-04 VITALS — BP 150/70 | HR 56 | Ht 67.0 in | Wt 221.0 lb

## 2023-01-04 DIAGNOSIS — Z87440 Personal history of urinary (tract) infections: Secondary | ICD-10-CM

## 2023-01-04 DIAGNOSIS — Z125 Encounter for screening for malignant neoplasm of prostate: Secondary | ICD-10-CM

## 2023-01-04 NOTE — Telephone Encounter (Signed)
Patient called in returning call he received.

## 2023-01-04 NOTE — Patient Instructions (Signed)

## 2023-01-04 NOTE — Progress Notes (Signed)
   01/04/2023 12:04 PM   Anthony Hunt Jul 06, 1951 [id number]  Reason for visit: Follow up history of UTI, elevated PSA, BPH  HPI: 72 year old male who I originally saw in December 2021 after being diagnosed with an Enterococcus UTI that completely resolved with antibiotics.  CT at that time showed no suspicious findings, hydronephrosis, or stone disease, and prostate was enlarged at 115g.  PVRs have always been normal, and he denies any urinary symptoms.  He has a history of a mildly elevated PSA of 6.86 in May 2022, which increased again in September 20 22-9.1 with a reassuring 22% free.  He ultimately opted for prostate MRI in December 2022 which showed no suspicious findings and a prostate volume of 90 g.  Repeat PSA has down trended and normalized including 6.83 in June 2023, and most recently 4.8(27% free) in June 2024.  Reassuring PSA trend reviewed, mild PSA elevation most likely secondary to BPH.  No indication for biopsy at this time.  Likely can discontinue screening per the AUA guidelines based on his age and reassuring PSA trend, as well as BPH has known etiology of mildly elevated PSA in the past.  Risks and benefits of screening were reviewed at length.  Follow-up with urology as needed  Sondra Come, MD  [facility] [address] [place], [place] [address] [phone]

## 2023-01-04 NOTE — Telephone Encounter (Signed)
Pt came by office asking for update on medication. Told pt a few nurses tried contacting him, pt states he didn't receive any calls from our office. Pt wants to know if Alphonsus Sias was going to add Jardiance or stop taking glipiZIDE?  Call back # [phone]

## 2023-01-05 MED ORDER — EMPAGLIFLOZIN 10 MG PO TABS: 10.0000 mg | ORAL_TABLET | Freq: Every day | ORAL | 3 refills | Status: DC

## 2023-01-05 NOTE — Telephone Encounter (Signed)
Spoke to pt. Advised him the [place] Pepper is in addition to the glipizide. Advised him to watch for low sugar readings. Rx sent to [facility] and appt made for 04-11-23.

## 2023-01-05 NOTE — Addendum Note (Signed)
Addended by: Eual Fines on: 01/05/2023 08:13 AM   Modules accepted: Orders

## 2023-02-08 ENCOUNTER — Encounter: Payer: Self-pay | Admitting: Gastroenterology

## 2023-03-02 ENCOUNTER — Encounter (INDEPENDENT_AMBULATORY_CARE_PROVIDER_SITE_OTHER): Payer: Self-pay

## 2023-03-15 DIAGNOSIS — Z0100 Encounter for examination of eyes and vision without abnormal findings: Secondary | ICD-10-CM | POA: Diagnosis not present

## 2023-03-15 DIAGNOSIS — H524 Presbyopia: Secondary | ICD-10-CM | POA: Diagnosis not present

## 2023-03-15 DIAGNOSIS — H35033 Hypertensive retinopathy, bilateral: Secondary | ICD-10-CM | POA: Diagnosis not present

## 2023-03-15 DIAGNOSIS — H40013 Open angle with borderline findings, low risk, bilateral: Secondary | ICD-10-CM | POA: Diagnosis not present

## 2023-03-27 ENCOUNTER — Ambulatory Visit (AMBULATORY_SURGERY_CENTER): Payer: Medicare HMO

## 2023-03-27 VITALS — Ht 69.0 in | Wt 216.0 lb

## 2023-03-27 DIAGNOSIS — Z86010 Personal history of colonic polyps: Secondary | ICD-10-CM

## 2023-03-27 MED ORDER — NA SULFATE-K SULFATE-MG SULF 17.5-3.13-1.6 GM/177ML PO SOLN: 1.0000 | Freq: Once | ORAL | 0 refills | Status: AC

## 2023-03-27 NOTE — Progress Notes (Signed)

## 2023-04-04 ENCOUNTER — Encounter: Payer: Self-pay | Admitting: Gastroenterology

## 2023-04-11 ENCOUNTER — Ambulatory Visit: Payer: Medicare HMO | Admitting: Internal Medicine

## 2023-04-11 ENCOUNTER — Encounter: Payer: Self-pay | Admitting: Internal Medicine

## 2023-04-11 VITALS — BP 122/74 | HR 55 | Temp 97.8°F | Ht 67.0 in | Wt 224.0 lb

## 2023-04-11 DIAGNOSIS — E1159 Type 2 diabetes mellitus with other circulatory complications: Secondary | ICD-10-CM

## 2023-04-11 DIAGNOSIS — Z7984 Long term (current) use of oral hypoglycemic drugs: Secondary | ICD-10-CM

## 2023-04-11 DIAGNOSIS — Z23 Encounter for immunization: Secondary | ICD-10-CM | POA: Diagnosis not present

## 2023-04-11 LAB — POCT GLYCOSYLATED HEMOGLOBIN (HGB A1C): Hemoglobin A1C: 6.6 % — AB (ref 4.0–5.6)

## 2023-04-11 NOTE — Assessment & Plan Note (Signed)
Lab Results  Component Value Date   HGBA1C 6.6 (A) 04/11/2023   Markedly better with adding jardiance 10 to metformin 1000 daily, glipizide 5mg  Asked him to monitor at least some of the time

## 2023-04-11 NOTE — Addendum Note (Signed)
Addended by: Eual Fines on: 04/11/2023 11:50 AM   Modules accepted: Orders

## 2023-04-11 NOTE — Progress Notes (Signed)
Subjective:    Patient ID: Anthony Hunt, male    DOB: 01/12/1951, 72 y.o.   MRN: [id number]  HPI Here for follow up on out of control diabetes  He had been surprised by the out of control diabetes Feels he is eating okay--fish, grilled chicken, salads He hasn't really changed much   Stil not checking sugars--discussed   Current Outpatient Medications on File Prior to Visit  Medication Sig Dispense Refill   aspirin 81 MG tablet Take 81 mg by mouth daily.     benazepril (LOTENSIN) 10 MG tablet TAKE 1 TABLET EVERY DAY 90 tablet 3   empagliflozin (JARDIANCE) 10 MG TABS tablet Take 1 tablet (10 mg total) by mouth daily before breakfast. 90 tablet 3   glipiZIDE (GLUCOTROL XL) 5 MG 24 hr tablet TAKE 1 TABLET EVERY DAY WITH BREAKFAST 90 tablet 3   glucose blood test strip Use to check blood sugar once a day. Dx Code E11.59 100 each 4   metFORMIN (GLUCOPHAGE-XR) 500 MG 24 hr tablet TAKE 2 TABLETS EVERY DAY WITH BREAKFAST 180 tablet 3   metoprolol succinate (TOPROL-XL) 25 MG 24 hr tablet TAKE 1 TABLET EVERY DAY 90 tablet 3   OneTouch Delica Lancets 30G MISC 1 each by Does not apply route daily. Use to check blood sugar daily. Dx Code E11.59 100 each 4   simvastatin (ZOCOR) 80 MG tablet TAKE 1 TABLET EVERY DAY  AT  6  PM 90 tablet 3   No current facility-administered medications on file prior to visit.    No Known Allergies  Past Medical History:  Diagnosis Date   Allergy    Bright's disease    age 6-6  no issues since    CAD (coronary artery disease) 2005   MI   Diabetes mellitus without complication (HCC)    Hyperlipidemia    Hypertension    Myocardial infarction (HCC) 2005   Pre-diabetes    Sleep apnea    Uses Cpap    Past Surgical History:  Procedure Laterality Date   CERVICAL DISC SURGERY     CORONARY STENT PLACEMENT  2005   ULNAR NERVE REPAIR     Left ulnar nerve decompression 02/07   US ECHOCARDIOGRAPHY      mild inf hypokinesis, EF 55%  09/05    Family  History  Problem Relation Age of Onset   Coronary artery disease Mother    Peripheral vascular disease Mother    Cancer Maternal Uncle    Cancer Paternal Aunt    Diabetes Neg Hx    Hypertension Neg Hx    Colon cancer Neg Hx    Colon polyps Neg Hx    Esophageal cancer Neg Hx    Rectal cancer Neg Hx    Stomach cancer Neg Hx    Bladder Cancer Neg Hx    Prostate cancer Neg Hx    Kidney cancer Neg Hx     Social History   Socioeconomic History   Marital status: Married    Spouse name: Not on file   Number of children: 1   Years of education: Not on file   Highest education level: 12th grade  Occupational History   Occupation: [employer]    Comment: Retired 10/22   Occupation: Invoice work/porter    Comment: Magazine features editor  Tobacco Use   Smoking status: Never    Passive exposure: Past   Smokeless tobacco: Current    Types: Chew   Tobacco comments:  Occasionally, couple times a week (on the weekends).  12/24/2021 hfb  Vaping Use   Vaping status: Never Used  Substance and Sexual Activity   Alcohol use: No    Alcohol/week: 0.0 standard drinks of alcohol   Drug use: No   Sexual activity: Not on file  Other Topics Concern   Not on file  Social History Narrative   No living will   Wife would be health care POA_--alternate is son   Would accept resuscitation attempts   Not sure about tube feeds   Social Determinants of Health   Financial Resource Strain: Low Risk  (04/07/2023)   Overall Financial Resource Strain (CARDIA)    Difficulty of Paying Living Expenses: Not hard at all  Food Insecurity: No Food Insecurity (04/07/2023)   Hunger Vital Sign    Worried About Running Out of Food in the Last Year: Never true    Ran Out of Food in the Last Year: Never true  Transportation Needs: No Transportation Needs (04/07/2023)   PRAPARE - Administrator, Civil Service (Medical): No    Lack of Transportation (Non-Medical): No  Physical Activity: Insufficiently  Active (04/07/2023)   Exercise Vital Sign    Days of Exercise per Week: 3 days    Minutes of Exercise per Session: 20 min  Stress: No Stress Concern Present (04/07/2023)   [employer] of Occupational Health - Occupational Stress Questionnaire    Feeling of Stress : Not at all  Social Connections: Socially Integrated (04/07/2023)   Social Connection and Isolation Panel [NHANES]    Frequency of Communication with Friends and Family: Twice a week    Frequency of Social Gatherings with Friends and Family: Twice a week    Attends Religious Services: More than 4 times per year    Active Member of [employer] or Organizations: Yes    Attends Engineer, structural: More than 4 times per year    Marital Status: Married  Catering manager Violence: Not on file   Review of Systems Weight is up slightly Is voiding more--now with nocturia 1-2 which is new     Objective:   Physical Exam Constitutional:      Appearance: Normal appearance.  Cardiovascular:     Rate and Rhythm: Normal rate and regular rhythm.     Pulses: Normal pulses.     Heart sounds: No murmur heard.    No gallop.  Pulmonary:     Effort: Pulmonary effort is normal.     Breath sounds: Normal breath sounds. No wheezing or rales.  Musculoskeletal:     Cervical back: Neck supple.     Right lower leg: No edema.     Left lower leg: No edema.  Lymphadenopathy:     Cervical: No cervical adenopathy.  Neurological:     Mental Status: He is alert.            Assessment & Plan:

## 2023-04-16 ENCOUNTER — Encounter: Payer: Self-pay | Admitting: Certified Registered Nurse Anesthetist

## 2023-04-17 ENCOUNTER — Encounter: Payer: Self-pay | Admitting: Gastroenterology

## 2023-04-17 ENCOUNTER — Ambulatory Visit (AMBULATORY_SURGERY_CENTER): Payer: Medicare HMO | Admitting: Gastroenterology

## 2023-04-17 VITALS — BP 124/65 | HR 46 | Temp 98.2°F | Resp 16 | Ht 69.0 in | Wt 216.0 lb

## 2023-04-17 DIAGNOSIS — D125 Benign neoplasm of sigmoid colon: Secondary | ICD-10-CM | POA: Diagnosis not present

## 2023-04-17 DIAGNOSIS — I2510 Atherosclerotic heart disease of native coronary artery without angina pectoris: Secondary | ICD-10-CM | POA: Diagnosis not present

## 2023-04-17 DIAGNOSIS — Z860100 Personal history of colon polyps, unspecified: Secondary | ICD-10-CM

## 2023-04-17 DIAGNOSIS — K635 Polyp of colon: Secondary | ICD-10-CM

## 2023-04-17 DIAGNOSIS — D124 Benign neoplasm of descending colon: Secondary | ICD-10-CM | POA: Diagnosis not present

## 2023-04-17 DIAGNOSIS — E119 Type 2 diabetes mellitus without complications: Secondary | ICD-10-CM | POA: Diagnosis not present

## 2023-04-17 DIAGNOSIS — I10 Essential (primary) hypertension: Secondary | ICD-10-CM | POA: Diagnosis not present

## 2023-04-17 DIAGNOSIS — Z09 Encounter for follow-up examination after completed treatment for conditions other than malignant neoplasm: Secondary | ICD-10-CM | POA: Diagnosis not present

## 2023-04-17 MED ORDER — SODIUM CHLORIDE 0.9 % IV SOLN: 500.0000 mL | INTRAVENOUS | Status: DC

## 2023-04-17 NOTE — Progress Notes (Signed)
Pt's states no medical or surgical changes since previsit or office visit. 

## 2023-04-17 NOTE — Patient Instructions (Signed)
Please read handouts provided. Continue present medications. Await pathology results. Repeat colonoscopy in 5 years for screening.   YOU HAD AN ENDOSCOPIC PROCEDURE TODAY AT THE [facility]:   Refer to the procedure report that was given to you for any specific questions about what was found during the examination.  If the procedure report does not answer your questions, please call your gastroenterologist to clarify.  If you requested that your care partner not be given the details of your procedure findings, then the procedure report has been included in a sealed envelope for you to review at your convenience later.  YOU SHOULD EXPECT: Some feelings of bloating in the abdomen. Passage of more gas than usual.  Walking can help get rid of the air that was put into your GI tract during the procedure and reduce the bloating. If you had a lower endoscopy (such as a colonoscopy or flexible sigmoidoscopy) you may notice spotting of blood in your stool or on the toilet paper. If you underwent a bowel prep for your procedure, you may not have a normal bowel movement for a few days.  Please Note:  You might notice some irritation and congestion in your nose or some drainage.  This is from the oxygen used during your procedure.  There is no need for concern and it should clear up in a day or so.  SYMPTOMS TO REPORT IMMEDIATELY:  Following lower endoscopy (colonoscopy or flexible sigmoidoscopy):  Excessive amounts of blood in the stool  Significant tenderness or worsening of abdominal pains  Swelling of the abdomen that is new, acute  Fever of 100F or higher  For urgent or emergent issues, a gastroenterologist can be reached at any hour by calling [phone]. Do not use MyChart messaging for urgent concerns.    DIET:  We do recommend a small meal at first, but then you may proceed to your regular diet.  Drink plenty of fluids but you should avoid alcoholic beverages for 24  hours.  ACTIVITY:  You should plan to take it easy for the rest of today and you should NOT DRIVE or use heavy machinery until tomorrow (because of the sedation medicines used during the test).    FOLLOW UP: Our staff will call the number listed on your records the next business day following your procedure.  We will call around 7:15- 8:00 am to check on you and address any questions or concerns that you may have regarding the information given to you following your procedure. If we do not reach you, we will leave a message.     If any biopsies were taken you will be contacted by phone or by letter within the next 1-3 weeks.  Please call us at [phone] if you have not heard about the biopsies in 3 weeks.    SIGNATURES/CONFIDENTIALITY: You and/or your care partner have signed paperwork which will be entered into your electronic medical record.  These signatures attest to the fact that that the information above on your After Visit Summary has been reviewed and is understood.  Full responsibility of the confidentiality of this discharge information lies with you and/or your care-partner. 

## 2023-04-17 NOTE — Progress Notes (Signed)
[facility] History and Physical   Primary Care Physician:  Karie Schwalbe, MD   Reason for Procedure:  History of adenomatous colon polyps  Plan:    Surveillance colonoscopy with possible interventions as needed     HPI: Anthony Hunt is a very pleasant 72 y.o. male here for surveillance colonoscopy. Denies any nausea, vomiting, abdominal pain, melena or bright red blood per rectum  The risks and benefits as well as alternatives of endoscopic procedure(s) have been discussed and reviewed. All questions answered. The patient agrees to proceed.    Past Medical History:  Diagnosis Date   Allergy    Bright's disease    age 65-6  no issues since    CAD (coronary artery disease) 2005   MI   Diabetes mellitus without complication (HCC)    Hyperlipidemia    Hypertension    Myocardial infarction (HCC) 2005   Pre-diabetes    Sleep apnea    Uses Cpap    Past Surgical History:  Procedure Laterality Date   CERVICAL DISC SURGERY     CORONARY STENT PLACEMENT  2005   ULNAR NERVE REPAIR     Left ulnar nerve decompression 02/07   US ECHOCARDIOGRAPHY      mild inf hypokinesis, EF 55%  09/05    Prior to Admission medications   Medication Sig Start Date End Date Taking? Authorizing Provider  aspirin 81 MG tablet Take 81 mg by mouth daily.   Yes [provider]  benazepril (LOTENSIN) 10 MG tablet TAKE 1 TABLET EVERY DAY 03/24/22  Yes Karie Schwalbe, MD  empagliflozin (JARDIANCE) 10 MG TABS tablet Take 1 tablet (10 mg total) by mouth daily before breakfast. 01/05/23  Yes Tillman Abide I, MD  glipiZIDE (GLUCOTROL XL) 5 MG 24 hr tablet TAKE 1 TABLET EVERY DAY WITH BREAKFAST 10/31/22  Yes Tillman Abide I, MD  metFORMIN (GLUCOPHAGE-XR) 500 MG 24 hr tablet TAKE 2 TABLETS EVERY DAY WITH BREAKFAST 03/24/22  Yes Tillman Abide I, MD  metoprolol succinate (TOPROL-XL) 25 MG 24 hr tablet TAKE 1 TABLET EVERY DAY 03/24/22  Yes Tillman Abide I, MD  simvastatin (ZOCOR)  80 MG tablet TAKE 1 TABLET EVERY DAY  AT  6  PM 03/24/22  Yes Tillman Abide I, MD  glucose blood test strip Use to check blood sugar once a day. Dx Code E11.59 06/02/20   Karie Schwalbe, MD  OneTouch Delica Lancets 30G MISC 1 each by Does not apply route daily. Use to check blood sugar daily. Dx Code E11.59 06/02/20   Karie Schwalbe, MD    Current Outpatient Medications  Medication Sig Dispense Refill   aspirin 81 MG tablet Take 81 mg by mouth daily.     benazepril (LOTENSIN) 10 MG tablet TAKE 1 TABLET EVERY DAY 90 tablet 3   empagliflozin (JARDIANCE) 10 MG TABS tablet Take 1 tablet (10 mg total) by mouth daily before breakfast. 90 tablet 3   glipiZIDE (GLUCOTROL XL) 5 MG 24 hr tablet TAKE 1 TABLET EVERY DAY WITH BREAKFAST 90 tablet 3   metFORMIN (GLUCOPHAGE-XR) 500 MG 24 hr tablet TAKE 2 TABLETS EVERY DAY WITH BREAKFAST 180 tablet 3   metoprolol succinate (TOPROL-XL) 25 MG 24 hr tablet TAKE 1 TABLET EVERY DAY 90 tablet 3   simvastatin (ZOCOR) 80 MG tablet TAKE 1 TABLET EVERY DAY  AT  6  PM 90 tablet 3   glucose blood test strip Use to check blood sugar once a day. Dx Code E11.59  100 each 4   OneTouch Delica Lancets 30G MISC 1 each by Does not apply route daily. Use to check blood sugar daily. Dx Code E11.59 100 each 4   Current Facility-Administered Medications  Medication Dose Route Frequency Provider Last Rate Last Admin   0.9 %  sodium chloride infusion  500 mL Intravenous Continuous Watt Geiler V, MD        Allergies as of 04/17/2023   (No Known Allergies)    Family History  Problem Relation Age of Onset   Coronary artery disease Mother    Peripheral vascular disease Mother    Cancer Maternal Uncle    Cancer Paternal Aunt    Diabetes Neg Hx    Hypertension Neg Hx    Colon cancer Neg Hx    Colon polyps Neg Hx    Esophageal cancer Neg Hx    Rectal cancer Neg Hx    Stomach cancer Neg Hx    Bladder Cancer Neg Hx    Prostate cancer Neg Hx    Kidney cancer Neg Hx      Social History   Socioeconomic History   Marital status: Married    Spouse name: Not on file   Number of children: 1   Years of education: Not on file   Highest education level: 12th grade  Occupational History   Occupation: [employer]    Comment: Retired 10/22   Occupation: Invoice work/porter    Comment: Magazine features editor  Tobacco Use   Smoking status: Never    Passive exposure: Past   Smokeless tobacco: Current    Types: Chew   Tobacco comments:    Occasionally, couple times a week (on the weekends).  12/24/2021 hfb  Vaping Use   Vaping status: Never Used  Substance and Sexual Activity   Alcohol use: No    Alcohol/week: 0.0 standard drinks of alcohol   Drug use: No   Sexual activity: Not on file  Other Topics Concern   Not on file  Social History Narrative   No living will   Wife would be health care POA_--alternate is son   Would accept resuscitation attempts   Not sure about tube feeds   Social Determinants of Health   Financial Resource Strain: Low Risk  (04/07/2023)   Overall Financial Resource Strain (CARDIA)    Difficulty of Paying Living Expenses: Not hard at all  Food Insecurity: No Food Insecurity (04/07/2023)   Hunger Vital Sign    Worried About Running Out of Food in the Last Year: Never true    Ran Out of Food in the Last Year: Never true  Transportation Needs: No Transportation Needs (04/07/2023)   PRAPARE - Administrator, Civil Service (Medical): No    Lack of Transportation (Non-Medical): No  Physical Activity: Insufficiently Active (04/07/2023)   Exercise Vital Sign    Days of Exercise per Week: 3 days    Minutes of Exercise per Session: 20 min  Stress: No Stress Concern Present (04/07/2023)   [employer] of Occupational Health - Occupational Stress Questionnaire    Feeling of Stress : Not at all  Social Connections: Socially Integrated (04/07/2023)   Social Connection and Isolation Panel [NHANES]    Frequency of  Communication with Friends and Family: Twice a week    Frequency of Social Gatherings with Friends and Family: Twice a week    Attends Religious Services: More than 4 times per year    Active Member of [employer] or Organizations: Yes  Attends Engineer, structural: More than 4 times per year    Marital Status: Married  Catering manager Violence: Not on file    Review of Systems:  All other review of systems negative except as mentioned in the HPI.  Physical Exam: Vital signs in last 24 hours: BP (!) 147/69   Pulse (!) 50   Temp 98.2 F (36.8 C)   Ht 5\' 9"  (1.753 m)   Wt 216 lb (98 kg)   SpO2 95%   BMI 31.90 kg/m  General:   Alert, NAD Lungs:  Clear .   Heart:  Regular rate and rhythm Abdomen:  Soft, nontender and nondistended. Neuro/Psych:  Alert and cooperative. Normal mood and affect. A and O x 3  Reviewed labs, radiology imaging, old records and pertinent past GI work up  Patient is appropriate for planned procedure(s) and anesthesia in an ambulatory setting   K. Scherry Ran , MD [phone]

## 2023-04-17 NOTE — Progress Notes (Signed)
Called to room to assist during endoscopic procedure.  Patient ID and intended procedure confirmed with present staff. Received instructions for my participation in the procedure from the performing physician.  

## 2023-04-17 NOTE — Progress Notes (Signed)
Report given to PACU, vss 

## 2023-04-17 NOTE — Op Note (Signed)
[facility] Patient Name: Anthony Hunt Procedure Date: 04/17/2023 9:20 AM MRN: [id number] Endoscopist: Napoleon Form , MD, [id number] Age: 72 Referring MD:  Date of Birth: [birth year] Gender: Male Account #: [id number] Procedure:                Colonoscopy Indications:              High risk colon cancer surveillance: Personal                            history of colonic polyps, High risk colon cancer                            surveillance: Personal history of multiple (3 or                            more) adenomas, High risk colon cancer                            surveillance: Personal history of adenoma less than                            10 mm in size Medicines:                Monitored Anesthesia Care Procedure:                Pre-Anesthesia Assessment:                           - Prior to the procedure, a History and Physical                            was performed, and patient medications and                            allergies were reviewed. The patient's tolerance of                            previous anesthesia was also reviewed. The risks                            and benefits of the procedure and the sedation                            options and risks were discussed with the patient.                            All questions were answered, and informed consent                            was obtained. Prior Anticoagulants: The patient has                            taken no anticoagulant or antiplatelet agents. ASA  Grade Assessment: III - A patient with severe                            systemic disease. After reviewing the risks and                            benefits, the patient was deemed in satisfactory                            condition to undergo the procedure.                           After obtaining informed consent, the colonoscope                            was passed under direct vision. Throughout the                             procedure, the patient's blood pressure, pulse, and                            oxygen saturations were monitored continuously. The                            Olympus Scope SN: [id number] was introduced through                            the anus and advanced to the the cecum, identified                            by appendiceal orifice and ileocecal valve. The                            colonoscopy was performed without difficulty. The                            patient tolerated the procedure well. The quality                            of the bowel preparation was adequate. The                            ileocecal valve, appendiceal orifice, and rectum                            were photographed. Scope In: 9:26:00 AM Scope Out: 9:39:55 AM Scope Withdrawal Time: 0 hours 11 minutes 15 seconds  Total Procedure Duration: 0 hours 13 minutes 55 seconds  Findings:                 The perianal and digital rectal examinations were                            normal.  Two sessile polyps were found in the sigmoid colon                            and descending colon. The polyps were 3 to 4 mm in                            size. These polyps were removed with a cold snare.                            Resection and retrieval were complete.                           Scattered large-mouthed, medium-mouthed and                            small-mouthed diverticula were found in the sigmoid                            colon, descending colon, transverse colon and                            ascending colon.                           Non-bleeding external and internal hemorrhoids were                            found during retroflexion. The hemorrhoids were                            medium-sized. Complications:            No immediate complications. Estimated Blood Loss:     Estimated blood loss was minimal. Impression:               - Two 3 to 4 mm polyps in  the sigmoid colon and in                            the descending colon, removed with a cold snare.                            Resected and retrieved.                           - Moderate diverticulosis in the sigmoid colon, in                            the descending colon, in the transverse colon and                            in the ascending colon.                           - Non-bleeding external and internal hemorrhoids. Recommendation:           - Patient  has a contact number available for                            emergencies. The signs and symptoms of potential                            delayed complications were discussed with the                            patient. Return to normal activities tomorrow.                            Written discharge instructions were provided to the                            patient.                           - Resume previous diet.                           - Continue present medications.                           - Await pathology results.                           - Repeat colonoscopy in 5 years for surveillance                            based on pathology results. Napoleon Form, MD 04/17/2023 9:47:37 AM This report has been signed electronically.

## 2023-04-18 ENCOUNTER — Telehealth: Payer: Self-pay

## 2023-04-18 NOTE — Telephone Encounter (Signed)
Follow up call to pt, no answer. 

## 2023-04-19 LAB — SURGICAL PATHOLOGY

## 2023-04-21 ENCOUNTER — Encounter: Payer: Self-pay | Admitting: Gastroenterology

## 2023-05-31 ENCOUNTER — Other Ambulatory Visit: Payer: Self-pay | Admitting: Internal Medicine

## 2023-05-31 ENCOUNTER — Telehealth: Payer: Self-pay | Admitting: Internal Medicine

## 2023-05-31 MED ORDER — BENAZEPRIL HCL 10 MG PO TABS: 10.0000 mg | ORAL_TABLET | Freq: Every day | ORAL | 3 refills | Status: DC

## 2023-05-31 MED ORDER — METOPROLOL SUCCINATE ER 25 MG PO TB24: 25.0000 mg | ORAL_TABLET | Freq: Every day | ORAL | 3 refills | Status: DC

## 2023-05-31 MED ORDER — SIMVASTATIN 80 MG PO TABS: 80.0000 mg | ORAL_TABLET | Freq: Every day | ORAL | 3 refills | Status: DC

## 2023-05-31 NOTE — Telephone Encounter (Signed)
Prescription Request  05/31/2023  LOV: 04/11/2023  What is the name of the medication or equipment? benazepril (LOTENSIN) 10 MG tablet   metoprolol succinate (TOPROL-XL) 25 MG 24 hr tablet   simvastatin (ZOCOR) 80 MG tablet   Have you contacted your pharmacy to request a refill? Yes   Which pharmacy would you like this sent to?    [facility] - [place], [place] - [address] [place] [address] Phone: [phone] Fax: [phone]    Patient notified that their request is being sent to the clinical staff for review and that they should receive a response within 2 business days.   Please advise at [facility] [phone]

## 2023-05-31 NOTE — Telephone Encounter (Signed)
Rx sent electronically.  

## 2023-06-27 ENCOUNTER — Other Ambulatory Visit: Payer: Self-pay

## 2023-06-27 ENCOUNTER — Telehealth: Payer: Medicare HMO | Admitting: Physician Assistant

## 2023-06-27 ENCOUNTER — Observation Stay (HOSPITAL_COMMUNITY)
Admission: EM | Admit: 2023-06-27 | Discharge: 2023-06-28 | Disposition: A | Discharge status: Home / Self Care | Payer: Medicare HMO | Attending: Internal Medicine | Admitting: Internal Medicine

## 2023-06-27 ENCOUNTER — Encounter (HOSPITAL_COMMUNITY): Payer: Self-pay

## 2023-06-27 DIAGNOSIS — R197 Diarrhea, unspecified: Secondary | ICD-10-CM

## 2023-06-27 DIAGNOSIS — Z79899 Other long term (current) drug therapy: Secondary | ICD-10-CM | POA: Diagnosis not present

## 2023-06-27 DIAGNOSIS — N183 Chronic kidney disease, stage 3 unspecified: Secondary | ICD-10-CM | POA: Diagnosis present

## 2023-06-27 DIAGNOSIS — K254 Chronic or unspecified gastric ulcer with hemorrhage: Secondary | ICD-10-CM | POA: Diagnosis not present

## 2023-06-27 DIAGNOSIS — K922 Gastrointestinal hemorrhage, unspecified: Secondary | ICD-10-CM | POA: Diagnosis present

## 2023-06-27 DIAGNOSIS — K2289 Other specified disease of esophagus: Secondary | ICD-10-CM | POA: Diagnosis not present

## 2023-06-27 DIAGNOSIS — K921 Melena: Secondary | ICD-10-CM | POA: Diagnosis not present

## 2023-06-27 DIAGNOSIS — E785 Hyperlipidemia, unspecified: Secondary | ICD-10-CM | POA: Diagnosis present

## 2023-06-27 DIAGNOSIS — E1122 Type 2 diabetes mellitus with diabetic chronic kidney disease: Secondary | ICD-10-CM | POA: Diagnosis not present

## 2023-06-27 DIAGNOSIS — N1831 Chronic kidney disease, stage 3a: Secondary | ICD-10-CM | POA: Insufficient documentation

## 2023-06-27 DIAGNOSIS — Z7984 Long term (current) use of oral hypoglycemic drugs: Secondary | ICD-10-CM | POA: Diagnosis not present

## 2023-06-27 DIAGNOSIS — G4733 Obstructive sleep apnea (adult) (pediatric): Secondary | ICD-10-CM | POA: Diagnosis not present

## 2023-06-27 DIAGNOSIS — R972 Elevated prostate specific antigen [PSA]: Secondary | ICD-10-CM | POA: Diagnosis present

## 2023-06-27 DIAGNOSIS — I2510 Atherosclerotic heart disease of native coronary artery without angina pectoris: Secondary | ICD-10-CM | POA: Diagnosis not present

## 2023-06-27 DIAGNOSIS — D696 Thrombocytopenia, unspecified: Secondary | ICD-10-CM | POA: Diagnosis not present

## 2023-06-27 DIAGNOSIS — Z7982 Long term (current) use of aspirin: Secondary | ICD-10-CM | POA: Insufficient documentation

## 2023-06-27 DIAGNOSIS — R001 Bradycardia, unspecified: Secondary | ICD-10-CM | POA: Diagnosis not present

## 2023-06-27 DIAGNOSIS — K269 Duodenal ulcer, unspecified as acute or chronic, without hemorrhage or perforation: Secondary | ICD-10-CM

## 2023-06-27 DIAGNOSIS — N179 Acute kidney failure, unspecified: Secondary | ICD-10-CM | POA: Diagnosis not present

## 2023-06-27 DIAGNOSIS — R109 Unspecified abdominal pain: Secondary | ICD-10-CM | POA: Diagnosis present

## 2023-06-27 DIAGNOSIS — E1159 Type 2 diabetes mellitus with other circulatory complications: Secondary | ICD-10-CM | POA: Diagnosis present

## 2023-06-27 LAB — URINALYSIS, ROUTINE W REFLEX MICROSCOPIC
Bacteria, UA: NONE SEEN
Bilirubin Urine: NEGATIVE
Glucose, UA: >=500 mg/dL — AB
Hgb urine dipstick: NEGATIVE
Ketones, ur: NEGATIVE mg/dL
Nitrite: NEGATIVE
Protein, ur: NEGATIVE mg/dL
Specific Gravity, Urine: 1.022 (ref 1.005–1.030)
pH: 5 (ref 5.0–8.0)

## 2023-06-27 LAB — CBC
HCT: 41.3 % (ref 39.0–52.0)
HCT: 38.2 % — ABNORMAL LOW (ref 39.0–52.0)
Hemoglobin: 13.8 g/dL (ref 13.0–17.0)
Hemoglobin: 12.7 g/dL — ABNORMAL LOW (ref 13.0–17.0)
MCH: 33.6 pg (ref 26.0–34.0)
MCH: 33.1 pg (ref 26.0–34.0)
MCHC: 33.4 g/dL (ref 30.0–36.0)
MCHC: 33.2 g/dL (ref 30.0–36.0)
MCV: 100.5 fL — ABNORMAL HIGH (ref 80.0–100.0)
MCV: 99.5 fL (ref 80.0–100.0)
Platelets: 132 10*3/uL — ABNORMAL LOW (ref 150–400)
Platelets: 105 10*3/uL — ABNORMAL LOW (ref 150–400)
RBC: 4.11 MIL/uL — ABNORMAL LOW (ref 4.22–5.81)
RBC: 3.84 MIL/uL — ABNORMAL LOW (ref 4.22–5.81)
RDW: 13.4 % (ref 11.5–15.5)
RDW: 13.3 % (ref 11.5–15.5)
WBC: 7.5 10*3/uL (ref 4.0–10.5)
WBC: 7.2 10*3/uL (ref 4.0–10.5)
nRBC: 0 % (ref 0.0–0.2)
nRBC: 0 % (ref 0.0–0.2)

## 2023-06-27 LAB — COMPREHENSIVE METABOLIC PANEL
ALT: 26 U/L (ref 0–44)
AST: 31 U/L (ref 15–41)
Albumin: 3.7 g/dL (ref 3.5–5.0)
Alkaline Phosphatase: 37 U/L — ABNORMAL LOW (ref 38–126)
Anion gap: 9 (ref 5–15)
BUN: 27 mg/dL — ABNORMAL HIGH (ref 8–23)
CO2: 24 mmol/L (ref 22–32)
Calcium: 9.6 mg/dL (ref 8.9–10.3)
Chloride: 110 mmol/L (ref 98–111)
Creatinine, Ser: 1.36 mg/dL — ABNORMAL HIGH (ref 0.61–1.24)
GFR, Estimated: 56 mL/min — ABNORMAL LOW (ref >60)
Glucose, Bld: 120 mg/dL — ABNORMAL HIGH (ref 70–99)
Potassium: 4.7 mmol/L (ref 3.5–5.1)
Sodium: 143 mmol/L (ref 135–145)
Total Bilirubin: 1.1 mg/dL (ref <1.2)
Total Protein: 6.3 g/dL — ABNORMAL LOW (ref 6.5–8.1)

## 2023-06-27 LAB — PROTIME-INR
INR: 1.1 (ref 0.8–1.2)
Prothrombin Time: 14.6 s (ref 11.4–15.2)

## 2023-06-27 LAB — TSH: TSH: 1.599 u[IU]/mL (ref 0.350–4.500)

## 2023-06-27 LAB — HEMOGLOBIN A1C
Hgb A1c MFr Bld: 6 % — ABNORMAL HIGH (ref 4.8–5.6)
Mean Plasma Glucose: 125.5 mg/dL

## 2023-06-27 LAB — TYPE AND SCREEN
ABO/RH(D): A POS
Antibody Screen: NEGATIVE

## 2023-06-27 LAB — ABO/RH: ABO/RH(D): A POS

## 2023-06-27 LAB — CBG MONITORING, ED: Glucose-Capillary: 102 mg/dL — ABNORMAL HIGH (ref 70–99)

## 2023-06-27 LAB — PHOSPHORUS: Phosphorus: 3.1 mg/dL (ref 2.5–4.6)

## 2023-06-27 LAB — CK: Total CK: 71 U/L (ref 49–397)

## 2023-06-27 LAB — MAGNESIUM: Magnesium: 2 mg/dL (ref 1.7–2.4)

## 2023-06-27 LAB — POC OCCULT BLOOD, ED: Fecal Occult Bld: POSITIVE — AB

## 2023-06-27 MED ORDER — PANTOPRAZOLE SODIUM 40 MG IV SOLR
40.0000 mg | Freq: Once | INTRAVENOUS | Status: AC
  Administered 2023-06-27: 40 mg via INTRAVENOUS
  Filled 2023-06-27: qty 10

## 2023-06-27 MED ORDER — INSULIN ASPART 100 UNIT/ML IJ SOLN
0.0000 [IU] | INTRAMUSCULAR | Status: DC
  Administered 2023-06-28: 2 [IU] via SUBCUTANEOUS
  Administered 2023-06-28: 1 [IU] via SUBCUTANEOUS

## 2023-06-27 MED ORDER — PANTOPRAZOLE SODIUM 40 MG IV SOLR
40.0000 mg | Freq: Two times a day (BID) | INTRAVENOUS | Status: DC
  Administered 2023-06-28: 40 mg via INTRAVENOUS
  Filled 2023-06-27: qty 10

## 2023-06-27 NOTE — Progress Notes (Signed)
Secure chat to provider Rhae Hammock, Greig Castilla MD) OK for Pt to be on clears until midnight.

## 2023-06-27 NOTE — H&P (Signed)
DINNIE DUGGIN JJO:[id number] DOB: 1950/11/30 DOA: 06/27/2023     PCP: Karie Schwalbe, MD   Outpatient Specialists:   Cardiology Dr. Caprice Red    Pulmonary   Dr. Clent Ridges    [facility]  Urology Dr. Richardo Hanks  Patient arrived to ER on 06/27/23 at 0859 Referred by Attending Therisa Doyne, MD   Patient coming from:    home Lives With family    Chief Complaint:   Chief Complaint  Patient presents with   Rectal Bleeding   Abdominal Pain   Weakness    HPI: Anthony Hunt is a 72 y.o. male with medical history significant of DM2, CAd, thrombocytopenia, OSA, HLD, HTN     Presented with melena Pt with melena for the pas 4 days  Not on [facility] but takes Aspirin 81 mg po  qday for CAD Deos not smoke or drink no epigastric pain  Np CP no SOB  Been a bit a bit weak No hematemesis  No prior hx of the same had a colonoscopy with Dr. Lavon Paganini September showed 2 polyps      Denies significant ETOH intake   Does not smoke   No results found for: "SARSCOV2NAA"      Regarding pertinent Chronic problems:    Hyperlipidemia - on statins Zocor (simvastatin)  Lipid Panel     Component Value Date/Time   CHOL 122 12/28/2022 0808   TRIG 193.0 (H) 12/28/2022 0808   HDL 34.10 (L) 12/28/2022 0808   CHOLHDL 4 12/28/2022 0808   VLDL 38.6 12/28/2022 0808   LDLCALC 50 12/28/2022 0808     HTN on Lotensin, toprol     CAD 2005 - On Aspirin, statin, betablocker                 -  followed by cardiology                - last cardiac cath 2005 sp 4 stents       DM 2 -  Lab Results  Component Value Date   HGBA1C 6.6 (A) 04/11/2023   on  PO meds only,         OSA -on nocturnal   with CPAP    CKD stage IIIa baseline Cr 1.3 Estimated Creatinine Clearance: 57.3 mL/min (A) (by C-G formula based on SCr of 1.36 mg/dL (H)).  Lab Results  Component Value Date   CREATININE 1.36 (H) 06/27/2023   CREATININE 1.29 12/28/2022   CREATININE 1.05 12/17/2021   Lab Results   Component Value Date   NA 143 06/27/2023   CL 110 06/27/2023   K 4.7 06/27/2023   CO2 24 06/27/2023   BUN 27 (H) 06/27/2023   CREATININE 1.36 (H) 06/27/2023   GFRNONAA 56 (L) 06/27/2023   CALCIUM 9.6 06/27/2023   PHOS 3.0 10/03/2017   ALBUMIN 3.7 06/27/2023   GLUCOSE 120 (H) 06/27/2023     While in ER:    Hg stable Hemoccult stool    Lab Orders         Urinalysis, Routine w reflex microscopic -Urine, Clean Catch         CBC         Comprehensive metabolic panel         POC occult blood, ED         CBG monitoring, ED      CT HEAD *** NON acute   MRI brain  ***no acute CVA  CXR - ***NON  acute  CTabd/pelvis - ***nonacute  CTA chest - ***nonacute, no PE, * no evidence of infiltrate  Following Medications were ordered in ER: Medications  pantoprazole (PROTONIX) injection 40 mg (40 mg Intravenous Given 06/27/23 1731)    _______________________________________________________ ER Provider Called:     [facility]  Dr.Gupta They Recommend admit to medicine   Will see in AM       ED Triage Vitals  Encounter Vitals Group     BP 06/27/23 0915 (!) 140/65     Systolic BP Percentile --      Diastolic BP Percentile --      Pulse Rate 06/27/23 0915 (!) 58     Resp 06/27/23 0915 16     Temp 06/27/23 0915 98 F (36.7 C)     Temp Source 06/27/23 0915 Oral     SpO2 06/27/23 0915 100 %     Weight 06/27/23 0945 216 lb (98 kg)     Height 06/27/23 0945 5\' 8"  (1.727 m)     Head Circumference --      Peak Flow --      Pain Score 06/27/23 0945 0     Pain Loc --      Pain Education --      Exclude from Growth Chart --   WGNF(62)@     _________________________________________ Significant initial  Findings: Abnormal Labs Reviewed  URINALYSIS, ROUTINE W REFLEX MICROSCOPIC - Abnormal; Notable for the following components:      Result Value   Glucose, UA >=500 (*)    Leukocytes,Ua SMALL (*)    All other components within normal limits  CBC - Abnormal; Notable for the following  components:   RBC 4.11 (*)    MCV 100.5 (*)    Platelets 132 (*)    All other components within normal limits  COMPREHENSIVE METABOLIC PANEL - Abnormal; Notable for the following components:   Glucose, Bld 120 (*)    BUN 27 (*)    Creatinine, Ser 1.36 (*)    Total Protein 6.3 (*)    Alkaline Phosphatase 37 (*)    GFR, Estimated 56 (*)    All other components within normal limits  POC OCCULT BLOOD, ED - Abnormal; Notable for the following components:   Fecal Occult Bld POSITIVE (*)    All other components within normal limits  CBG MONITORING, ED - Abnormal; Notable for the following components:   Glucose-Capillary 102 (*)    All other components within normal limits      _________________________ Troponin ***ordered Cardiac Panel (last 3 results) No results for input(s): "CKTOTAL", "CKMB", "TROPONINIHS", "RELINDX" in the last 72 hours.   ECG: Ordered Personally reviewed and interpreted by me showing: HR : 54 Rhythm: Sinus bradycardia Low voltage QRS Septal infarct , age undetermined Abnormal ECG QTC 434    The recent clinical data is shown below. Vitals:   06/27/23 0915 06/27/23 0945 06/27/23 1334 06/27/23 1736  BP: (!) 140/65  (!) 120/56 135/64  Pulse: (!) 58  (!) 56 (!) 47  Resp: 16  18 12   Temp: 98 F (36.7 C)  98 F (36.7 C) 98.3 F (36.8 C)  TempSrc: Oral  Oral Oral  SpO2: 100%  99% 97%  Weight:  98 kg    Height:  5\' 8"  (1.727 m)        WBC     Component Value Date/Time   WBC 7.5 06/27/2023 1650   LYMPHSABS 1.3 08/30/2016 0927   MONOABS 0.5 08/30/2016 1308  EOSABS 0.3 08/30/2016 0927   BASOSABS 0.0 08/30/2016 0927      UA   no evidence of UTI  Urine analysis:    Component Value Date/Time   COLORURINE YELLOW 06/27/2023 1010   APPEARANCEUR CLEAR 06/27/2023 1010   APPEARANCEUR Clear 06/22/2020 1327   LABSPEC 1.022 06/27/2023 1010   PHURINE 5.0 06/27/2023 1010   GLUCOSEU >=500 (A) 06/27/2023 1010   HGBUR NEGATIVE 06/27/2023 1010   BILIRUBINUR  NEGATIVE 06/27/2023 1010   BILIRUBINUR Negative 06/22/2020 1327   KETONESUR NEGATIVE 06/27/2023 1010   PROTEINUR NEGATIVE 06/27/2023 1010   NITRITE NEGATIVE 06/27/2023 1010   LEUKOCYTESUR SMALL (A) 06/27/2023 1010    Results for orders placed or performed in visit on 06/22/20  Microscopic Examination     Status: None   Collection Time: 06/22/20  1:27 PM   Urine  Result Value Ref Range Status   WBC, UA 0-5 0 - 5 /hpf Final   RBC, Urine 0-2 0 - 2 /hpf Final   Epithelial Cells (non renal) None seen 0 - 10 /hpf Final   Bacteria, UA None seen None seen/Few Final     __________________________________________________________ Recent Labs  Lab 06/27/23 1650  NA 143  K 4.7  CO2 24  GLUCOSE 120*  BUN 27*  CREATININE 1.36*  CALCIUM 9.6    Cr   Up from baseline see below Lab Results  Component Value Date   CREATININE 1.36 (H) 06/27/2023   CREATININE 1.29 12/28/2022   CREATININE 1.05 12/17/2021    Recent Labs  Lab 06/27/23 1650  AST 31  ALT 26  ALKPHOS 37*  BILITOT 1.1  PROT 6.3*  ALBUMIN 3.7   Lab Results  Component Value Date   CALCIUM 9.6 06/27/2023   PHOS 3.0 10/03/2017    Plt: Lab Results  Component Value Date   PLT 132 (L) 06/27/2023    Recent Labs  Lab 06/27/23 1650  WBC 7.5  HGB 13.8  HCT 41.3  MCV 100.5*  PLT 132*    HG/HCT  stable     Component Value Date/Time   HGB 13.8 06/27/2023 1650   HCT 41.3 06/27/2023 1650   MCV 100.5 (H) 06/27/2023 1650   ______________________________________ Hospitalist was called for admission for   Melena   The following Work up has been ordered so far:  Orders Placed This Encounter  Procedures   Urinalysis, Routine w reflex microscopic -Urine, Clean Catch   CBC   Comprehensive metabolic panel   Diet NPO time specified   Initiate Carrier Fluid Protocol   Document Height and Actual Weight   Cardiac Monitoring Continuous x 48 hours Indications for use: Other; Other indications for use: melena   Consult to  gastroenterology  [facility]   Consult to hospitalist   POC occult blood, ED   CBG monitoring, ED   ED EKG   Type and screen [facility]   ABO/Rh   Admit to Inpatient (patient's expected length of stay will be greater than 2 midnights or inpatient only procedure)     OTHER Significant initial  Findings:  labs showing:     DM  labs:  HbA1C: Recent Labs    12/28/22 0808 04/11/23 0738  HGBA1C 9.9* 6.6*    CBG (last 3)  Recent Labs    06/27/23 1741  GLUCAP 102*    Cultures:    Component Value Date/Time   SDES  06/12/2020 1300    URINE, CLEAN CATCH Performed at [facility], [address] [place], [place] [address]    [facility]  06/12/2020 1300    NONE Performed at [facility], [address] [place], [place] [address]    CULT 50,000 COLONIES/mL ENTEROCOCCUS FAECALIS (A) 06/12/2020 1300   REPTSTATUS 06/14/2020 FINAL 06/12/2020 1300     Radiological Exams on Admission: No results found. _______________________________________________________________________________________________________ Latest  Blood pressure 135/64, pulse (!) 47, temperature 98.3 F (36.8 C), temperature source Oral, resp. rate 12, height 5\' 8"  (1.727 m), weight 98 kg, SpO2 97%.   Vitals  labs and radiology finding personally reviewed  Review of Systems:    Pertinent positives include:  , fatigue, melena,   Constitutional:  No weight loss, night sweats, Fevers, chillsweight loss  HEENT:  No headaches, Difficulty swallowing,Tooth/dental problems,Sore throat,  No sneezing, itching, ear ache, nasal congestion, post nasal drip,  Cardio-vascular:  No chest pain, Orthopnea, PND, anasarca, dizziness, palpitations.no Bilateral lower extremity swelling  GI:  No heartburn, indigestion, abdominal pain, nausea, vomiting, diarrhea, change in bowel habits, loss of appetite, blood in stool, hematemesis Resp:  no shortness of breath at rest. No dyspnea on  exertion, No excess mucus, no productive cough, No non-productive cough, No coughing up of blood.No change in color of mucus.No wheezing. Skin:  no rash or lesions. No jaundice GU:  no dysuria, change in color of urine, no urgency or frequency. No straining to urinate.  No flank pain.  Musculoskeletal:  No joint pain or no joint swelling. No decreased range of motion. No back pain.  Psych:  No change in mood or affect. No depression or anxiety. No memory loss.  Neuro: no localizing neurological complaints, no tingling, no weakness, no double vision, no gait abnormality, no slurred speech, no confusion  All systems reviewed and apart from HOPI all are negative _______________________________________________________________________________________________ Past Medical History:   Past Medical History:  Diagnosis Date   Allergy    Bright's disease    age 27-6  no issues since    CAD (coronary artery disease) 2005   MI   Diabetes mellitus without complication (HCC)    Hyperlipidemia    Hypertension    Myocardial infarction (HCC) 2005   Pre-diabetes    Sleep apnea    Uses Cpap     Past Surgical History:  Procedure Laterality Date   CERVICAL DISC SURGERY     CORONARY STENT PLACEMENT  2005   ULNAR NERVE REPAIR     Left ulnar nerve decompression 02/07   US ECHOCARDIOGRAPHY      mild inf hypokinesis, EF 55%  09/05    Social History:  Ambulatory   independently       reports that he has never smoked. He has been exposed to tobacco smoke. His smokeless tobacco use includes chew. He reports that he does not drink alcohol and does not use drugs.    Family History:   Family History  Problem Relation Age of Onset   Coronary artery disease Mother    Peripheral vascular disease Mother    Cancer Maternal Uncle    Cancer Paternal Aunt    Diabetes Neg Hx    Hypertension Neg Hx    Colon cancer Neg Hx    Colon polyps Neg Hx    Esophageal cancer Neg Hx    Rectal cancer Neg Hx     Stomach cancer Neg Hx    Bladder Cancer Neg Hx    Prostate cancer Neg Hx    Kidney cancer Neg Hx  ______________________________________________________________________________________________ Allergies: No Known Allergies   Prior to Admission medications   Medication Sig Start Date End Date Taking? Authorizing Provider  aspirin 81 MG tablet Take 81 mg by mouth daily.   Yes [provider]  benazepril (LOTENSIN) 10 MG tablet Take 1 tablet (10 mg total) by mouth daily. 05/31/23  Yes Karie Schwalbe, MD  empagliflozin (JARDIANCE) 10 MG TABS tablet Take 1 tablet (10 mg total) by mouth daily before breakfast. 01/05/23  Yes Tillman Abide I, MD  glipiZIDE (GLUCOTROL XL) 5 MG 24 hr tablet TAKE 1 TABLET EVERY DAY WITH BREAKFAST 10/31/22  Yes Tillman Abide I, MD  metFORMIN (GLUCOPHAGE-XR) 500 MG 24 hr tablet TAKE 2 TABLETS EVERY DAY WITH BREAKFAST 06/01/23  Yes Karie Schwalbe, MD  metoprolol succinate (TOPROL-XL) 25 MG 24 hr tablet Take 1 tablet (25 mg total) by mouth daily. 05/31/23  Yes Karie Schwalbe, MD  simvastatin (ZOCOR) 80 MG tablet Take 1 tablet (80 mg total) by mouth daily at 6 PM. 05/31/23  Yes Tillman Abide I, MD  glucose blood test strip Use to check blood sugar once a day. Dx Code E11.59 06/02/20   Karie Schwalbe, MD  OneTouch Delica Lancets 30G MISC 1 each by Does not apply route daily. Use to check blood sugar daily. Dx Code E11.59 06/02/20   Tillman Abide I, MD    ___________________________________________________________________________________________________ Physical Exam:    06/27/2023    5:36 PM 06/27/2023    1:34 PM 06/27/2023    9:45 AM  Vitals with BMI  Height   5\' 8"   Weight   216 lbs  BMI   32.85  Systolic 135 120   Diastolic 64 56   Pulse 47 56     1. General:  in No  Acute distress    well \-appearing 2. Psychological: Alert and *** Oriented 3. Head/ENT:   Moist *** Dry Mucous Membranes                          Head Non  traumatic, neck supple                          Normal *** Poor Dentition 4. SKIN: normal *** decreased Skin turgor,  Skin clean Dry and intact no rash    5. Heart: Regular rate and rhythm no*** Murmur, no Rub or gallop 6. Lungs: ***Clear to auscultation bilaterally, no wheezes or crackles   7. Abdomen: Soft, ***non-tender, Non distended *** obese ***bowel sounds present 8. Lower extremities: no clubbing, cyanosis, no ***edema 9. Neurologically Grossly intact, moving all 4 extremities equally *** strength 5 out of 5 in all 4 extremities cranial nerves II through XII intact 10. MSK: Normal range of motion    Chart has been reviewed  ______________________________________________________________________________________________  Assessment/Plan  72 y.o. male with medical history significant of DM2, CAd, thrombocytopenia, OSA, HLD, HTN   Admitted for   Melena      Present on Admission:  Melena     No problem-specific Assessment & Plan notes found for this encounter.    Other plan as per orders.  DVT prophylaxis:  SCD *** Lovenox       Code Status:    Code Status: Not on file FULL CODE *** DNR/DNI ***comfort care as per patient ***family  I had personally discussed CODE STATUS with patient and family*  ACP *** none has been reviewed ***   Family  Communication:   Family not at  Bedside  plan of care was discussed on the phone with *** Son, Daughter, Wife, Husband, Sister, Brother , father, mother  Diet  Diet Orders (From admission, onward)     Start     Ordered   06/27/23 0958  Diet NPO time specified  Diet effective now        06/27/23 1610            Disposition Plan:   *** likely will need placement for rehabilitation                          Back to current facility when stable                            To home once workup is complete and patient is stable  ***Following barriers for discharge:                             Chest pain *** Stroke *** work up  is complete                            Electrolytes corrected                               Anemia corrected h/H stable                             Pain controlled with PO medications                               Afebrile, white count improving able to transition to PO antibiotics                             Will need to be able to tolerate PO                            Will likely need home health, home O2, set up                           Will need consultants to evaluate patient prior to discharge       Consult Orders  (From admission, onward)           Start     Ordered   06/27/23 1855  Consult to hospitalist  Paged by Annice Pih  Once       Provider:  (Not yet assigned)  Question Answer Comment  Place call to: [facility]   Reason for Consult Admit      06/27/23 1854                              ***Would benefit from PT/OT eval prior to DC  Ordered                   Swallow eval - SLP ordered  Diabetes care coordinator                   Transition of care consulted                   Nutrition    consulted                  Wound care  consulted                   Palliative care    consulted                   Behavioral health  consulted                    Consults called: ***   Treatment Team:  Sherrilyn Rist, MD  Admission status:  ED Disposition     ED Disposition  Admit   Condition  --   Comment  Hospital Area: [facility] [[id number]]  Level of Care: Telemetry Medical [[id number]]  May admit patient to [facility] or [facility] if equivalent level of care is available:: No  Covid Evaluation: Asymptomatic - no recent exposure (last 10 days) testing not required  Diagnosis: Melena [[id number]]  Admitting Physician: Therisa Doyne [[id number]]  Attending Physician: Therisa Doyne [[id number]]  Certification:: I certify this patient will need inpatient services for at least 2 midnights  Expected Medical Readiness: 06/30/2023            Obs***  ***  inpatient     I Expect 2 midnight stay secondary to severity of patient's current illness need for inpatient interventions justified by the following: ***hemodynamic instability despite optimal treatment (tachycardia *hypotension * tachypnea *hypoxia, hypercapnia) * Severe lab/radiological/exam abnormalities including:     and extensive comorbidities including: *substance abuse  *Chronic pain *DM2  * CHF * CAD  * COPD/asthma *Morbid Obesity * CKD *dementia *liver disease *history of stroke with residual deficits *  malignancy, * sickle cell disease  History of amputation Chronic anticoagulation  That are currently affecting medical management.   I expect  patient to be hospitalized for 2 midnights requiring inpatient medical care.  Patient is at high risk for adverse outcome (such as loss of life or disability) if not treated.  Indication for inpatient stay as follows:  Severe change from baseline regarding mental status Hemodynamic instability despite maximal medical therapy,  ongoing suicidal ideations,  severe pain requiring acute inpatient management,  inability to maintain oral hydration   persistent chest pain despite medical management Need for operative/procedural  intervention New or worsening hypoxia   Need for IV antibiotics, IV fluids, IV rate controling medications, IV antihypertensives, IV pain medications, IV anticoagulation, need for biPAP    Level of care   *** tele  For 12H 24H     medical floor       progressive     stepdown   tele indefinitely please discontinue once patient no longer qualifies COVID-19 Labs    No results found for: "SARSCOV2NAA"   Precautions: admitted as *** Covid Negative  ***asymptomatic screening protocol****PUI *** covid positive No active isolations ***If Covid PCR is negative  - please DC precautions - would need additional investigation given very high risk for false native test  result    Critical***  Patient is critically ill due to  hemodynamic instability * respiratory failure *severe sepsis* ongoing chest pain*  They are at high risk for  life/limb threatening clinical deterioration requiring frequent reassessment and modifications of care.  Services provided include examination of the patient, review of relevant ancillary tests, prescription of lifesaving therapies, review of medications and prophylactic therapy.  Total critical care time excluding separately billable procedures: 60*  Minutes.    Anthony Hunt 06/27/2023, 7:50 PM ***  [facility]     after 2 AM please page floor coverage PA If 7AM-7PM, please contact the day team taking care of the patient using [email or web address]

## 2023-06-27 NOTE — ED Provider Notes (Signed)
[facility] DE[facility] AT [facility] Provider Note   CSN: [id number] Arrival date & time: 06/27/23  9528     History  Chief Complaint  Patient presents with   Rectal Bleeding   Abdominal Pain   Weakness    Anthony Hunt is a 72 y.o. male.  73 year old male with past medical history of diabetes and hypertension presenting to the emergency department today with melena.  The patient states that he has been having dark stools now for the past few days.  He denies any associated abdominal pain with this.  He denies any nausea, vomiting, or hematemesis.  He is on aspirin but denies being on any other blood thinners or antiplatelet therapy.  He states that he has had a colonoscopy earlier this year which did show some polyps but was otherwise unremarkable.  He does follow with [facility].  He reports that he was at work yesterday and was feeling fatigue with this.  He denies any chest pain, shortness of breath, or lightheadedness.  He came here for further evaluation as he continued to have black stools.  He does not drink any alcohol.   Rectal Bleeding Associated symptoms: abdominal pain   Abdominal Pain Associated symptoms: hematochezia   Weakness Associated symptoms: abdominal pain and hematochezia        Home Medications Prior to Admission medications   Medication Sig Start Date End Date Taking? Authorizing Provider  aspirin 81 MG tablet Take 81 mg by mouth daily.   Yes [provider]  benazepril (LOTENSIN) 10 MG tablet Take 1 tablet (10 mg total) by mouth daily. 05/31/23  Yes Karie Schwalbe, MD  empagliflozin (JARDIANCE) 10 MG TABS tablet Take 1 tablet (10 mg total) by mouth daily before breakfast. 01/05/23  Yes Tillman Abide I, MD  glipiZIDE (GLUCOTROL XL) 5 MG 24 hr tablet TAKE 1 TABLET EVERY DAY WITH BREAKFAST 10/31/22  Yes Tillman Abide I, MD  metFORMIN (GLUCOPHAGE-XR) 500 MG 24 hr tablet TAKE 2 TABLETS EVERY DAY WITH BREAKFAST 06/01/23   Yes Karie Schwalbe, MD  metoprolol succinate (TOPROL-XL) 25 MG 24 hr tablet Take 1 tablet (25 mg total) by mouth daily. 05/31/23  Yes Karie Schwalbe, MD  simvastatin (ZOCOR) 80 MG tablet Take 1 tablet (80 mg total) by mouth daily at 6 PM. 05/31/23  Yes Tillman Abide I, MD  glucose blood test strip Use to check blood sugar once a day. Dx Code E11.59 06/02/20   Karie Schwalbe, MD  OneTouch Delica Lancets 30G MISC 1 each by Does not apply route daily. Use to check blood sugar daily. Dx Code E11.59 06/02/20   Karie Schwalbe, MD      Allergies    Patient has no known allergies.    Review of Systems   Review of Systems  Gastrointestinal:  Positive for abdominal pain and hematochezia.       Melena  Neurological:  Positive for weakness.  All other systems reviewed and are negative.   Physical Exam Updated Vital Signs BP 135/64 (BP Location: Right Arm)   Pulse (!) 47   Temp 98.3 F (36.8 C) (Oral)   Resp 12   Ht 5\' 8"  (1.727 m)   Wt 98 kg   SpO2 97%   BMI 32.84 kg/m  Physical Exam Vitals and nursing note reviewed.   Gen: NAD Eyes: PERRL, EOMI HEENT: no oropharyngeal swelling Neck: trachea midline Resp: clear to auscultation bilaterally Card: RRR, no murmurs, rubs, or gallops  Abd: nontender, nondistended Rectal: The patient has black stool noted with no gross blood Extremities: no calf tenderness, no edema Vascular: 2+ radial pulses bilaterally, 2+ DP pulses bilaterally Skin: no rashes Psyc: acting appropriately   ED Results / Procedures / Treatments   Labs (all labs ordered are listed, but only abnormal results are displayed) Labs Reviewed  URINALYSIS, ROUTINE W REFLEX MICROSCOPIC - Abnormal; Notable for the following components:      Result Value   Glucose, UA >=500 (*)    Leukocytes,Ua SMALL (*)    All other components within normal limits  CBC - Abnormal; Notable for the following components:   RBC 4.11 (*)    MCV 100.5 (*)    Platelets 132 (*)     All other components within normal limits  COMPREHENSIVE METABOLIC PANEL - Abnormal; Notable for the following components:   Glucose, Bld 120 (*)    BUN 27 (*)    Creatinine, Ser 1.36 (*)    Total Protein 6.3 (*)    Alkaline Phosphatase 37 (*)    GFR, Estimated 56 (*)    All other components within normal limits  POC OCCULT BLOOD, ED - Abnormal; Notable for the following components:   Fecal Occult Bld POSITIVE (*)    All other components within normal limits  CBG MONITORING, ED - Abnormal; Notable for the following components:   Glucose-Capillary 102 (*)    All other components within normal limits  TYPE AND SCREEN  ABO/RH    EKG None  Radiology No results found.  Procedures Procedures    Medications Ordered in ED Medications  pantoprazole (PROTONIX) injection 40 mg (40 mg Intravenous Given 06/27/23 1731)    ED Course/ Medical Decision Making/ A&P                                 Medical Decision Making 72 year old male with past medical history of diabetes and hypertension presenting to the emergency department today with melena.  I will further evaluate patient here with basic labs as well as type and screen in the event that the patient does require transfusion.  Will send his stool sample for fecal occult blood testing.  I will call discuss his case with his workup is complete with GI.  He denies any history of liver dysfunction or alcohol abuse so suspicion for variceal bleeding is low at this time.  He does not have any significant abdominal pain here on exam.  Will give him Protonix here.  I will reevaluate for ultimate disposition.  The patient's hemoglobin has dropped 2 g but is still within normal limits.  His Hemoccult is positive.  I did call and discussed this case with Dr. Chales Abrahams.  He recommends admission.  Clear liquids are okay until after midnight.  The plan is to have an EGD tomorrow morning.  The patient is given the Protonix here.  Amount and/or Complexity  of Data Reviewed Labs: ordered.  Risk Prescription drug management. Decision regarding hospitalization.           Final Clinical Impression(s) / ED Diagnoses Final diagnoses:  Melena    Rx / DC Orders ED Discharge Orders     None         Durwin Glaze, MD 06/27/23 Windell Moment

## 2023-06-27 NOTE — Subjective & Objective (Signed)
Pt with melena for the pas 4 days  Not on [facility] but takes Aspirin 81 mg po  qday for CAD Deos not smoke or drink no epigastric pain  Np CP no SOB  Been a bit a bit weak No hematemesis  No prior hx of the same had a colonoscopy with Dr. Lavon Paganini September showed 2 polyps

## 2023-06-27 NOTE — ED Notes (Signed)
ED TO INPATIENT HANDOFF REPORT  ED Nurse Name and Phone #: Rodney Booze [phone]  S Name/Age/Gender Anthony Hunt 72 y.o. male Room/Bed: 009C/009C  Code Status   Code Status: Full Code  Home/SNF/Other Home Patient oriented to: self, place, time, and situation Is this baseline? Yes   Triage Complete: Triage complete  Chief Complaint Melena [K92.1]  Triage Note Pt. Stated, Lavenia Atlas had jet black stool since Saturday night with feeling not hungry and some weakness.   Allergies No Known Allergies  Level of Care/Admitting Diagnosis ED Disposition     ED Disposition  Admit   Condition  --   Comment  [facility]ospital [facility] [[id number]]  Level of Care: Telemetry Medical [[id number]]  May admit patient to [facility] or [facility] if equivalent level of care is available:: No  Covid Evaluation: Asymptomatic - no recent exposure (last 10 days) testing not required  Diagnosis: Melena [170500]  Admitting Physician: Therisa Doyne [[id number]]  Attending Physician: Therisa Doyne [[id number]]  Certification:: I certify this patient will need inpatient services for at least 2 midnights  Expected Medical Readiness: 06/30/2023          B Medical/Surgery History Past Medical History:  Diagnosis Date   Allergy    Bright's disease    age 63-6  no issues since    CAD (coronary artery disease) 2005   MI   Diabetes mellitus without complication (HCC)    Hyperlipidemia    Hypertension    Myocardial infarction (HCC) 2005   Pre-diabetes    Sleep apnea    Uses Cpap   Past Surgical History:  Procedure Laterality Date   CERVICAL DISC SURGERY     CORONARY STENT PLACEMENT  2005   ULNAR NERVE REPAIR     Left ulnar nerve decompression 02/07   US ECHOCARDIOGRAPHY      mild inf hypokinesis, EF 55%  09/05     A IV Location/Drains/Wounds Patient Lines/Drains/Airways Status     Active Line/Drains/Airways     Name Placement date Placement time Site Days   Peripheral IV  06/27/23 20 G Anterior;Proximal;Right Forearm 06/27/23  1723  Forearm  less than 1            Intake/Output Last 24 hours No intake or output data in the 24 hours ending 06/27/23 2036  Labs/Imaging Results for orders placed or performed during the hospital encounter of 06/27/23 (from the past 48 hour(s))  Type and screen [facility]     Status: None   Collection Time: 06/27/23 10:10 AM  Result Value Ref Range   ABO/RH(D) A POS    Antibody Screen NEG    Sample Expiration      06/30/2023,2359 Performed at [facility], [address], [place], [place] [address]   Urinalysis, Routine w reflex microscopic -Urine, Clean Catch     Status: Abnormal   Collection Time: 06/27/23 10:10 AM  Result Value Ref Range   Color, Urine YELLOW YELLOW   APPearance CLEAR CLEAR   Specific Gravity, Urine 1.022 1.005 - 1.030   pH 5.0 5.0 - 8.0   Glucose, UA >=500 (A) NEGATIVE mg/dL   Hgb urine dipstick NEGATIVE NEGATIVE   Bilirubin Urine NEGATIVE NEGATIVE   Ketones, ur NEGATIVE NEGATIVE mg/dL   Protein, ur NEGATIVE NEGATIVE mg/dL   Nitrite NEGATIVE NEGATIVE   Leukocytes,Ua SMALL (A) NEGATIVE   RBC / HPF 0-5 0 - 5 RBC/hpf   WBC, UA 0-5 0 - 5 WBC/hpf  Bacteria, UA NONE SEEN NONE SEEN   Squamous Epithelial / HPF 0-5 0 - 5 /HPF   Mucus PRESENT     Comment: Performed at [facility], [address] [place], [place] [address]  CBC     Status: Abnormal   Collection Time: 06/27/23  4:50 PM  Result Value Ref Range   WBC 7.5 4.0 - 10.5 K/uL   RBC 4.11 (L) 4.22 - 5.81 MIL/uL   Hemoglobin 13.8 13.0 - 17.0 g/dL   HCT 21.3 08.6 - 57.8 %   MCV 100.5 (H) 80.0 - 100.0 fL   MCH 33.6 26.0 - 34.0 pg   MCHC 33.4 30.0 - 36.0 g/dL   RDW 46.9 62.9 - 52.8 %   Platelets 132 (L) 150 - 400 K/uL   nRBC 0.0 0.0 - 0.2 %    Comment: Performed at [facility], [address] [place], [place] [address]  Comprehensive metabolic panel     Status: Abnormal   Collection Time: 06/27/23   4:50 PM  Result Value Ref Range   Sodium 143 135 - 145 mmol/L   Potassium 4.7 3.5 - 5.1 mmol/L   Chloride 110 98 - 111 mmol/L   CO2 24 22 - 32 mmol/L   Glucose, Bld 120 (H) 70 - 99 mg/dL    Comment: Glucose reference range applies only to samples taken after fasting for at least 8 hours.   BUN 27 (H) 8 - 23 mg/dL   Creatinine, Ser 4.01 (H) 0.61 - 1.24 mg/dL   Calcium 9.6 8.9 - 02.7 mg/dL   Total Protein 6.3 (L) 6.5 - 8.1 g/dL   Albumin 3.7 3.5 - 5.0 g/dL   AST 31 15 - 41 U/L   ALT 26 0 - 44 U/L   Alkaline Phosphatase 37 (L) 38 - 126 U/L   Total Bilirubin 1.1 <1.2 mg/dL   GFR, Estimated 56 (L) >60 mL/min    Comment: (NOTE) Calculated using the CKD-EPI Creatinine Equation (2021)    Anion gap 9 5 - 15    Comment: Performed at [facility], [address], [place], [place] [address]  Hemoglobin A1c     Status: Abnormal   Collection Time: 06/27/23  4:50 PM  Result Value Ref Range   Hgb A1c MFr Bld 6.0 (H) 4.8 - 5.6 %    Comment: (NOTE) Pre diabetes:          5.7%-6.4%  Diabetes:              >6.4%  Glycemic control for   <7.0% adults with diabetes    Mean Plasma Glucose 125.5 mg/dL    Comment: Performed at [facility], [address] [place], [place] [address]  POC occult blood, ED     Status: Abnormal   Collection Time: 06/27/23  5:28 PM  Result Value Ref Range   Fecal Occult Bld POSITIVE (A) NEGATIVE  CBG monitoring, ED     Status: Abnormal   Collection Time: 06/27/23  5:41 PM  Result Value Ref Range   Glucose-Capillary 102 (H) 70 - 99 mg/dL    Comment: Glucose reference range applies only to samples taken after fasting for at least 8 hours.   No results found.  Pending Labs Unresulted Labs (From admission, onward)     Start     Ordered   06/28/23 0500  Prealbumin  Tomorrow morning,   R        06/27/23 2005  06/27/23 2200  CBC  Now then every 6 hours,   R (with TIMED occurrences)      06/27/23 2001   06/27/23 2006  CK  Add-on,   AD        06/27/23  2005   06/27/23 2006  Magnesium  Add-on,   AD        06/27/23 2005   06/27/23 2006  Phosphorus  Add-on,   AD        06/27/23 2005   06/27/23 2006  TSH  Add-on,   AD        06/27/23 2005   06/27/23 2006  Protime-INR  Once,   R        06/27/23 2005   06/27/23 1330  ABO/Rh  Once,   R        06/27/23 1330   Signed and Held  HIV Antibody (routine testing w rflx)  (HIV Antibody (Routine testing w reflex) panel)  Once,   R        Signed and Held   Signed and Held  Magnesium  Tomorrow morning,   R        Signed and Held   Signed and Held  Phosphorus  Tomorrow morning,   R        Signed and Held   Signed and Held  Comprehensive metabolic panel  Tomorrow morning,   R       Question:  Release to patient  Answer:  Immediate   Signed and Held   Signed and Held  CBC  Tomorrow morning,   R       Question:  Release to patient  Answer:  Immediate   Signed and Held            Vitals/Pain Today's Vitals   06/27/23 0945 06/27/23 1334 06/27/23 1736 06/27/23 2027  BP:  (!) 120/56 135/64   Pulse:  (!) 56 (!) 47 (!) 49  Resp:  18 12   Temp:  98 F (36.7 C) 98.3 F (36.8 C)   TempSrc:  Oral Oral   SpO2:  99% 97% 99%  Weight: 98 kg     Height: 5\' 8"  (1.727 m)     PainSc: 0-No pain       Isolation Precautions No active isolations  Medications Medications  insulin aspart (novoLOG) injection 0-9 Units (has no administration in time range)  pantoprazole (PROTONIX) injection 40 mg (has no administration in time range)  pantoprazole (PROTONIX) injection 40 mg (40 mg Intravenous Given 06/27/23 1731)    Mobility walks     Focused Assessments Cardiac Assessment Handoff:    No results found for: "CKTOTAL", "CKMB", "CKMBINDEX", "TROPONINI" No results found for: "DDIMER" Does the Patient currently have chest pain? No    R Recommendations: See Admitting Provider Note  Report given to:   Additional Notes:

## 2023-06-27 NOTE — ED Triage Notes (Signed)
Pt. Stated, Ive had jet black stool since Saturday night with feeling not hungry and some weakness.

## 2023-06-27 NOTE — Progress Notes (Signed)
Because of having black stools, I feel your condition warrants further evaluation and I recommend that you be seen in a face to face visit. It would be recommended to have stool tested for blood. Black stools do not always mean blood in the stool, as it can be a side effect of common medications, but being that it is new and associated with diarrhea, you should be evaluated.    NOTE: There will be NO CHARGE for this eVisit   If you are having a true medical emergency please call 911.      For an urgent face to face visit, [place] has eight urgent care centers for your convenience:   NEW!! [facility] Center at [facility] Get Driving Directions [phone] [address] [place], [address]    [facility] Health Urgent Care Center at [facility] Get Driving Directions [phone] [address] [place], [place] [address]   [facility] Health Urgent Care Center [facility]) Get Driving Directions [phone] [address] [place], [place] [address]  [facility] Urgent Care Center [facility]) Get Driving Directions [phone] [address] [place],  [place]  [address]  [facility] Health Urgent Care Center [facility] - at [employer]  [phone] [address] [place],  [place] [address]   [facility] Health Urgent Care at [facility] Get Driving Directions [phone] [address] [place], [place] [address]   [facility] Health Urgent Care at [facility] Get Driving Directions  [phone] [address] [place], [place] [address]   [facility] Health Urgent Care at [facility] Directions [phone] [address], [address] [place], [place] [address]  Your MyChart E-visit questionnaire answers were reviewed by a board certified advanced clinical practitioner to complete your personal care plan based on your specific symptoms.  Thank  you for using e-Visits.    I have spent 5 minutes in review of e-visit questionnaire, review and updating patient chart, medical decision making and response to patient.   Margaretann Loveless, PA-C

## 2023-06-28 ENCOUNTER — Encounter (HOSPITAL_COMMUNITY): Admission: EM | Disposition: A | Discharge status: Home / Self Care | Payer: Self-pay | Source: Home / Self Care

## 2023-06-28 ENCOUNTER — Inpatient Hospital Stay (HOSPITAL_COMMUNITY): Payer: Medicare HMO | Admitting: Anesthesiology

## 2023-06-28 ENCOUNTER — Encounter (HOSPITAL_COMMUNITY): Payer: Self-pay | Admitting: Internal Medicine

## 2023-06-28 DIAGNOSIS — K2289 Other specified disease of esophagus: Secondary | ICD-10-CM | POA: Diagnosis not present

## 2023-06-28 DIAGNOSIS — D62 Acute posthemorrhagic anemia: Secondary | ICD-10-CM

## 2023-06-28 DIAGNOSIS — K922 Gastrointestinal hemorrhage, unspecified: Secondary | ICD-10-CM | POA: Diagnosis present

## 2023-06-28 DIAGNOSIS — K921 Melena: Secondary | ICD-10-CM | POA: Diagnosis not present

## 2023-06-28 DIAGNOSIS — K5989 Other specified functional intestinal disorders: Secondary | ICD-10-CM | POA: Diagnosis not present

## 2023-06-28 DIAGNOSIS — K2270 Barrett's esophagus without dysplasia: Secondary | ICD-10-CM | POA: Diagnosis not present

## 2023-06-28 DIAGNOSIS — K269 Duodenal ulcer, unspecified as acute or chronic, without hemorrhage or perforation: Secondary | ICD-10-CM

## 2023-06-28 DIAGNOSIS — N179 Acute kidney failure, unspecified: Secondary | ICD-10-CM | POA: Diagnosis not present

## 2023-06-28 DIAGNOSIS — N183 Chronic kidney disease, stage 3 unspecified: Secondary | ICD-10-CM | POA: Diagnosis present

## 2023-06-28 DIAGNOSIS — I129 Hypertensive chronic kidney disease with stage 1 through stage 4 chronic kidney disease, or unspecified chronic kidney disease: Secondary | ICD-10-CM | POA: Diagnosis not present

## 2023-06-28 DIAGNOSIS — K31A0 Gastric intestinal metaplasia, unspecified: Secondary | ICD-10-CM

## 2023-06-28 DIAGNOSIS — E1122 Type 2 diabetes mellitus with diabetic chronic kidney disease: Secondary | ICD-10-CM | POA: Diagnosis not present

## 2023-06-28 DIAGNOSIS — K259 Gastric ulcer, unspecified as acute or chronic, without hemorrhage or perforation: Secondary | ICD-10-CM

## 2023-06-28 DIAGNOSIS — R001 Bradycardia, unspecified: Secondary | ICD-10-CM | POA: Diagnosis present

## 2023-06-28 DIAGNOSIS — K254 Chronic or unspecified gastric ulcer with hemorrhage: Secondary | ICD-10-CM | POA: Diagnosis not present

## 2023-06-28 DIAGNOSIS — R972 Elevated prostate specific antigen [PSA]: Secondary | ICD-10-CM | POA: Diagnosis not present

## 2023-06-28 DIAGNOSIS — I2510 Atherosclerotic heart disease of native coronary artery without angina pectoris: Secondary | ICD-10-CM | POA: Diagnosis not present

## 2023-06-28 DIAGNOSIS — D696 Thrombocytopenia, unspecified: Secondary | ICD-10-CM | POA: Diagnosis not present

## 2023-06-28 DIAGNOSIS — K3189 Other diseases of stomach and duodenum: Secondary | ICD-10-CM | POA: Diagnosis not present

## 2023-06-28 HISTORY — PX: ESOPHAGOGASTRODUODENOSCOPY (EGD) WITH PROPOFOL: SHX5813

## 2023-06-28 HISTORY — PX: BIOPSY: SHX5522

## 2023-06-28 LAB — CBC
HCT: 37 % — ABNORMAL LOW (ref 39.0–52.0)
HCT: 37 % — ABNORMAL LOW (ref 39.0–52.0)
HCT: 37.1 % — ABNORMAL LOW (ref 39.0–52.0)
Hemoglobin: 12.8 g/dL — ABNORMAL LOW (ref 13.0–17.0)
Hemoglobin: 12.7 g/dL — ABNORMAL LOW (ref 13.0–17.0)
Hemoglobin: 12.9 g/dL — ABNORMAL LOW (ref 13.0–17.0)
MCH: 33.3 pg (ref 26.0–34.0)
MCH: 33.6 pg (ref 26.0–34.0)
MCH: 33.5 pg (ref 26.0–34.0)
MCHC: 34.6 g/dL (ref 30.0–36.0)
MCHC: 34.3 g/dL (ref 30.0–36.0)
MCHC: 34.8 g/dL (ref 30.0–36.0)
MCV: 96.4 fL (ref 80.0–100.0)
MCV: 97.9 fL (ref 80.0–100.0)
MCV: 96.4 fL (ref 80.0–100.0)
Platelets: 115 10*3/uL — ABNORMAL LOW (ref 150–400)
Platelets: 120 10*3/uL — ABNORMAL LOW (ref 150–400)
Platelets: 122 10*3/uL — ABNORMAL LOW (ref 150–400)
RBC: 3.84 MIL/uL — ABNORMAL LOW (ref 4.22–5.81)
RBC: 3.78 MIL/uL — ABNORMAL LOW (ref 4.22–5.81)
RBC: 3.85 MIL/uL — ABNORMAL LOW (ref 4.22–5.81)
RDW: 13.2 % (ref 11.5–15.5)
RDW: 13.2 % (ref 11.5–15.5)
RDW: 13.1 % (ref 11.5–15.5)
WBC: 6.3 10*3/uL (ref 4.0–10.5)
WBC: 6.2 10*3/uL (ref 4.0–10.5)
WBC: 5.9 10*3/uL (ref 4.0–10.5)
nRBC: 0 % (ref 0.0–0.2)
nRBC: 0 % (ref 0.0–0.2)
nRBC: 0 % (ref 0.0–0.2)

## 2023-06-28 LAB — GLUCOSE, CAPILLARY
Glucose-Capillary: 106 mg/dL — ABNORMAL HIGH (ref 70–99)
Glucose-Capillary: 110 mg/dL — ABNORMAL HIGH (ref 70–99)
Glucose-Capillary: 130 mg/dL — ABNORMAL HIGH (ref 70–99)
Glucose-Capillary: 134 mg/dL — ABNORMAL HIGH (ref 70–99)
Glucose-Capillary: 166 mg/dL — ABNORMAL HIGH (ref 70–99)
Glucose-Capillary: 79 mg/dL (ref 70–99)

## 2023-06-28 LAB — PHOSPHORUS: Phosphorus: 3.5 mg/dL (ref 2.5–4.6)

## 2023-06-28 LAB — COMPREHENSIVE METABOLIC PANEL
ALT: 27 U/L (ref 0–44)
AST: 29 U/L (ref 15–41)
Albumin: 3.3 g/dL — ABNORMAL LOW (ref 3.5–5.0)
Alkaline Phosphatase: 33 U/L — ABNORMAL LOW (ref 38–126)
Anion gap: 6 (ref 5–15)
BUN: 22 mg/dL (ref 8–23)
CO2: 25 mmol/L (ref 22–32)
Calcium: 9.1 mg/dL (ref 8.9–10.3)
Chloride: 109 mmol/L (ref 98–111)
Creatinine, Ser: 1.49 mg/dL — ABNORMAL HIGH (ref 0.61–1.24)
GFR, Estimated: 50 mL/min — ABNORMAL LOW (ref >60)
Glucose, Bld: 138 mg/dL — ABNORMAL HIGH (ref 70–99)
Potassium: 4.5 mmol/L (ref 3.5–5.1)
Sodium: 140 mmol/L (ref 135–145)
Total Bilirubin: 1 mg/dL (ref <1.2)
Total Protein: 5.8 g/dL — ABNORMAL LOW (ref 6.5–8.1)

## 2023-06-28 LAB — HIV ANTIBODY (ROUTINE TESTING W REFLEX): HIV Screen 4th Generation wRfx: NONREACTIVE

## 2023-06-28 LAB — PREALBUMIN: Prealbumin: 27 mg/dL (ref 18–38)

## 2023-06-28 LAB — MAGNESIUM: Magnesium: 2 mg/dL (ref 1.7–2.4)

## 2023-06-28 SURGERY — ESOPHAGOGASTRODUODENOSCOPY (EGD) WITH PROPOFOL
Anesthesia: Monitor Anesthesia Care

## 2023-06-28 MED ORDER — ASPIRIN 81 MG PO TBEC
81.0000 mg | DELAYED_RELEASE_TABLET | Freq: Every day | ORAL | Status: DC
  Filled 2023-06-28: qty 1

## 2023-06-28 MED ORDER — PROPOFOL 10 MG/ML IV BOLUS
INTRAVENOUS | Status: DC | PRN
  Administered 2023-06-28: 20 mg via INTRAVENOUS

## 2023-06-28 MED ORDER — METOPROLOL SUCCINATE ER 25 MG PO TB24
25.0000 mg | ORAL_TABLET | Freq: Every day | ORAL | Status: DC
  Administered 2023-06-28: 25 mg via ORAL
  Filled 2023-06-28: qty 1

## 2023-06-28 MED ORDER — SODIUM CHLORIDE 0.9 % IV SOLN: INTRAVENOUS | Status: AC

## 2023-06-28 MED ORDER — SODIUM CHLORIDE 0.9 % IV SOLN: INTRAVENOUS | Status: DC

## 2023-06-28 MED ORDER — ATORVASTATIN CALCIUM 40 MG PO TABS
40.0000 mg | ORAL_TABLET | Freq: Every day | ORAL | Status: DC
  Filled 2023-06-28: qty 1

## 2023-06-28 MED ORDER — ONDANSETRON HCL 4 MG PO TABS: 4.0000 mg | ORAL_TABLET | Freq: Four times a day (QID) | ORAL | Status: DC | PRN

## 2023-06-28 MED ORDER — ONDANSETRON HCL 4 MG/2ML IJ SOLN: 4.0000 mg | Freq: Four times a day (QID) | INTRAMUSCULAR | Status: DC | PRN

## 2023-06-28 MED ORDER — PROPOFOL 500 MG/50ML IV EMUL
INTRAVENOUS | Status: DC | PRN
  Administered 2023-06-28: 135 ug/kg/min via INTRAVENOUS

## 2023-06-28 MED ORDER — ACETAMINOPHEN 325 MG PO TABS
650.0000 mg | ORAL_TABLET | Freq: Four times a day (QID) | ORAL | Status: DC | PRN
Start: 2023-06-28 — End: 2023-06-28

## 2023-06-28 MED ORDER — HYDROCODONE-ACETAMINOPHEN 5-325 MG PO TABS: 1.0000 | ORAL_TABLET | ORAL | Status: DC | PRN

## 2023-06-28 MED ORDER — EPHEDRINE SULFATE (PRESSORS) 50 MG/ML IJ SOLN
INTRAMUSCULAR | Status: DC | PRN
  Administered 2023-06-28: 5 mg via INTRAVENOUS

## 2023-06-28 MED ORDER — PANTOPRAZOLE SODIUM 40 MG PO TBEC: 40.0000 mg | DELAYED_RELEASE_TABLET | Freq: Two times a day (BID) | ORAL | 0 refills | Status: DC

## 2023-06-28 MED ORDER — ACETAMINOPHEN 650 MG RE SUPP: 650.0000 mg | Freq: Four times a day (QID) | RECTAL | Status: DC | PRN

## 2023-06-28 SURGICAL SUPPLY — 14 items
BLOCK BITE 60FR ADLT L/F BLUE (MISCELLANEOUS) ×3 IMPLANT
ELECT REM PT RETURN 9FT ADLT (ELECTROSURGICAL)
ELECTRODE REM PT RTRN 9FT ADLT (ELECTROSURGICAL) IMPLANT
FORCEP RJ3 GP 1.8X160 W-NEEDLE (CUTTING FORCEPS) IMPLANT
FORCEPS BIOP RAD 4 LRG CAP 4 (CUTTING FORCEPS) IMPLANT
NDL SCLEROTHERAPY 25GX240 (NEEDLE) IMPLANT
NEEDLE SCLEROTHERAPY 25GX240 (NEEDLE) IMPLANT
PROBE APC STR FIRE (PROBE) IMPLANT
PROBE INJECTION GOLD 7FR (MISCELLANEOUS) IMPLANT
SNARE SHORT THROW 13M SML OVAL (MISCELLANEOUS) IMPLANT
SYR 50ML LL SCALE MARK (SYRINGE) IMPLANT
TUBING ENDO SMARTCAP PENTAX (MISCELLANEOUS) ×6 IMPLANT
TUBING IRRIGATION ENDOGATOR (MISCELLANEOUS) ×3 IMPLANT
WATER STERILE IRR 1000ML POUR (IV SOLUTION) IMPLANT

## 2023-06-28 NOTE — Anesthesia Preprocedure Evaluation (Signed)
Anesthesia Evaluation  Patient identified by MRN, date of birth, ID band Patient awake    Reviewed: Allergy & Precautions, H&P , NPO status , Patient's Chart, lab work & pertinent test results  Airway Mallampati: II   Neck ROM: full    Dental   Pulmonary sleep apnea    breath sounds clear to auscultation       Cardiovascular hypertension, + CAD and + Past MI   Rhythm:regular Rate:Normal     Neuro/Psych    GI/Hepatic   Endo/Other  diabetes, Type 2    Renal/GU      Musculoskeletal   Abdominal   Peds  Hematology   Anesthesia Other Findings   Reproductive/Obstetrics                             Anesthesia Physical Anesthesia Plan  ASA: 3  Anesthesia Plan: MAC   Post-op Pain Management:    Induction: Intravenous  PONV Risk Score and Plan: 1 and Propofol infusion and Treatment may vary due to age or medical condition  Airway Management Planned: Nasal Cannula  Additional Equipment:   Intra-op Plan:   Post-operative Plan:   Informed Consent: I have reviewed the patients History and Physical, chart, labs and discussed the procedure including the risks, benefits and alternatives for the proposed anesthesia with the patient or authorized representative who has indicated his/her understanding and acceptance.     Dental advisory given  Plan Discussed with: CRNA, Anesthesiologist and Surgeon  Anesthesia Plan Comments:        Anesthesia Quick Evaluation

## 2023-06-28 NOTE — Assessment & Plan Note (Signed)
 Chronic followed by urology

## 2023-06-28 NOTE — Care Management CC44 (Signed)
Condition Code 44 Documentation Completed  Patient Details  Name: Anthony Hunt MRN: [id number] Date of Birth: [birth year]   Condition Code 44 given:  Yes Patient signature on Condition Code 44 notice:  Yes Documentation of 2 MD's agreement:  Yes Code 44 added to claim:  Yes    Kingsley Plan, RN 06/28/2023, 3:55 PM

## 2023-06-28 NOTE — Assessment & Plan Note (Signed)
Order CPAP. 

## 2023-06-28 NOTE — Assessment & Plan Note (Signed)
Chronic stable patient says his baseline heart rate is usually between 40-50

## 2023-06-28 NOTE — Progress Notes (Signed)
Pt return from Endo. VSS, Alert and orient times 4. Wife at bedside. Place telemetry on patient. Pt requested ginger ale and receive a menu. Call button in place, just waiting on tray and patient can be discharge later.

## 2023-06-28 NOTE — Care Management Obs Status (Signed)
MEDICARE OBSERVATION STATUS NOTIFICATION   Patient Details  Name: DEMARCOS PALOMARES MRN: [id number] Date of Birth: 10/10/1950   Medicare Observation Status Notification Given:  Yes    Kingsley Plan, RN 06/28/2023, 3:55 PM

## 2023-06-28 NOTE — Assessment & Plan Note (Signed)
Order sliding scale hold p.o. medications 

## 2023-06-28 NOTE — Assessment & Plan Note (Signed)
Continue Zocor 80 mg a day

## 2023-06-28 NOTE — Plan of Care (Signed)
  Problem: Education: Goal: Ability to describe self-care measures that may prevent or decrease complications (Diabetes Survival Skills Education) will improve Outcome: Adequate for Discharge Goal: Individualized Educational Video(s) Outcome: Adequate for Discharge   Problem: Coping: Goal: Ability to adjust to condition or change in health will improve Outcome: Adequate for Discharge   Problem: Fluid Volume: Goal: Ability to maintain a balanced intake and output will improve Outcome: Adequate for Discharge   Problem: Health Behavior/Discharge Planning: Goal: Ability to identify and utilize available resources and services will improve Outcome: Adequate for Discharge Goal: Ability to manage health-related needs will improve Outcome: Adequate for Discharge   Problem: Metabolic: Goal: Ability to maintain appropriate glucose levels will improve Outcome: Adequate for Discharge   Problem: Nutritional: Goal: Maintenance of adequate nutrition will improve Outcome: Adequate for Discharge Goal: Progress toward achieving an optimal weight will improve Outcome: Adequate for Discharge   Problem: Skin Integrity: Goal: Risk for impaired skin integrity will decrease Outcome: Adequate for Discharge   Problem: Tissue Perfusion: Goal: Adequacy of tissue perfusion will improve Outcome: Adequate for Discharge   Problem: Education: Goal: Knowledge of General Education information will improve Description: Including pain rating scale, medication(s)/side effects and non-pharmacologic comfort measures Outcome: Adequate for Discharge   Problem: Health Behavior/Discharge Planning: Goal: Ability to manage health-related needs will improve Outcome: Adequate for Discharge   Problem: Clinical Measurements: Goal: Ability to maintain clinical measurements within normal limits will improve Outcome: Adequate for Discharge Goal: Will remain free from infection Outcome: Adequate for Discharge Goal:  Diagnostic test results will improve Outcome: Adequate for Discharge Goal: Respiratory complications will improve Outcome: Adequate for Discharge Goal: Cardiovascular complication will be avoided Outcome: Adequate for Discharge   Problem: Activity: Goal: Risk for activity intolerance will decrease Outcome: Adequate for Discharge   Problem: Coping: Goal: Level of anxiety will decrease Outcome: Adequate for Discharge   Problem: Nutrition: Goal: Adequate nutrition will be maintained Outcome: Adequate for Discharge   Problem: Elimination: Goal: Will not experience complications related to bowel motility Outcome: Adequate for Discharge Goal: Will not experience complications related to urinary retention Outcome: Adequate for Discharge   Problem: Pain Management: Goal: General experience of comfort will improve Outcome: Adequate for Discharge   Problem: Safety: Goal: Ability to remain free from injury will improve Outcome: Adequate for Discharge   Problem: Skin Integrity: Goal: Risk for impaired skin integrity will decrease Outcome: Adequate for Discharge

## 2023-06-28 NOTE — Assessment & Plan Note (Signed)
Chronic-stable.

## 2023-06-28 NOTE — Consult Note (Addendum)
Consultation  Referring Provider:   Dr. Natale Milch Primary Care Physician:  Karie Schwalbe, MD Primary Gastroenterologist:    Dr. Lavon Paganini     Reason for Consultation: Melena            HPI:   Anthony Hunt is a 72 y.o. male with a past medical history as listed below including diabetes, CAD, thrombocytopenia, OSA, hypertension and multiple others, who presented to the ER on 06/27/2023 with melena.    At time of admission patient described melena for the past 4 days.  Does report using Aspirin 81 mg daily for CAD.  Describes some weakness.    Today, patient tells me that he started feeling sort of "yucky" in his abdomen around Saturday and had 2-3 episodes of black tarry sticky stool.  This continued on Sunday and Monday with the same amount of stools.  No nausea or vomiting.  He has not had a bowel movement since Monday, 06/26/2023.  He is on 81 mg of aspirin for CAD and has been since 2005, he did use 1 dose of Ibuprofen on Saturday, but usually does not use NSAIDs and is in fairly good health otherwise.    Denies fever, chills or weight loss.  ER course: Fecal occult positive, platelets 132, normal hemoglobin, BUN 27, alk phos 37, EKG with sinus bradycardia and heart rate 54, pressure soft 140/65  GI history: 04/17/2023 colonoscopy for history of polyps with 2 to 3-4 mm polyps in the sigmoid colon and descending colon, moderate diverticulosis in the sigmoid, descending, transverse and ascending colon as well as nonbleeding external and internal hemorrhoids, pathology with hyperplastic polyps; repeat recommended in 5 years  Past Medical History:  Diagnosis Date   Allergy    Bright's disease    age [age]  no issues since    CAD (coronary artery disease) 2005   MI   Diabetes mellitus without complication (HCC)    Hyperlipidemia    Hypertension    Myocardial infarction (HCC) 2005   Pre-diabetes    Sleep apnea    Uses Cpap    Past Surgical History:  Procedure Laterality Date    CERVICAL DISC SURGERY     CORONARY STENT PLACEMENT  2005   ULNAR NERVE REPAIR     Left ulnar nerve decompression 02/07   US ECHOCARDIOGRAPHY      mild inf hypokinesis, EF 55%  09/05    Family History  Problem Relation Age of Onset   Coronary artery disease Mother    Peripheral vascular disease Mother    Cancer Maternal Uncle    Cancer Paternal Aunt    Diabetes Neg Hx    Hypertension Neg Hx    Colon cancer Neg Hx    Colon polyps Neg Hx    Esophageal cancer Neg Hx    Rectal cancer Neg Hx    Stomach cancer Neg Hx    Bladder Cancer Neg Hx    Prostate cancer Neg Hx    Kidney cancer Neg Hx     Social History   Tobacco Use   Smoking status: Never    Passive exposure: Past   Smokeless tobacco: Current    Types: Chew   Tobacco comments:    Occasionally, couple times a week (on the weekends).  12/24/2021 hfb  Vaping Use   Vaping status: Never Used  Substance Use Topics   Alcohol use: No    Alcohol/week: 0.0 standard drinks of alcohol   Drug use: No  Prior to Admission medications   Medication Sig Start Date End Date Taking? Authorizing Provider  aspirin 81 MG tablet Take 81 mg by mouth daily.   Yes [provider]  benazepril (LOTENSIN) 10 MG tablet Take 1 tablet (10 mg total) by mouth daily. 05/31/23  Yes Karie Schwalbe, MD  empagliflozin (JARDIANCE) 10 MG TABS tablet Take 1 tablet (10 mg total) by mouth daily before breakfast. 01/05/23  Yes Tillman Abide I, MD  glipiZIDE (GLUCOTROL XL) 5 MG 24 hr tablet TAKE 1 TABLET EVERY DAY WITH BREAKFAST 10/31/22  Yes Tillman Abide I, MD  metFORMIN (GLUCOPHAGE-XR) 500 MG 24 hr tablet TAKE 2 TABLETS EVERY DAY WITH BREAKFAST 06/01/23  Yes Karie Schwalbe, MD  metoprolol succinate (TOPROL-XL) 25 MG 24 hr tablet Take 1 tablet (25 mg total) by mouth daily. 05/31/23  Yes Karie Schwalbe, MD  simvastatin (ZOCOR) 80 MG tablet Take 1 tablet (80 mg total) by mouth daily at 6 PM. 05/31/23  Yes Tillman Abide I, MD  glucose  blood test strip Use to check blood sugar once a day. Dx Code E11.59 06/02/20   Karie Schwalbe, MD  OneTouch Delica Lancets 30G MISC 1 each by Does not apply route daily. Use to check blood sugar daily. Dx Code E11.59 06/02/20   Karie Schwalbe, MD    Current Facility-Administered Medications  Medication Dose Route Frequency Provider Last Rate Last Admin   0.9 %  sodium chloride infusion   Intravenous Continuous Therisa Doyne, MD 75 mL/hr at 06/28/23 0611 New Bag at 06/28/23 1610   acetaminophen (TYLENOL) tablet 650 mg  650 mg Oral Q6H PRN Therisa Doyne, MD       Or   acetaminophen (TYLENOL) suppository 650 mg  650 mg Rectal Q6H PRN Therisa Doyne, MD       aspirin EC tablet 81 mg  81 mg Oral Daily Doutova, Anastassia, MD       atorvastatin (LIPITOR) tablet 40 mg  40 mg Oral Daily Doutova, Anastassia, MD       HYDROcodone-acetaminophen (NORCO/VICODIN) 5-325 MG per tablet 1-2 tablet  1-2 tablet Oral Q4H PRN Doutova, Anastassia, MD       insulin aspart (novoLOG) injection 0-9 Units  0-9 Units Subcutaneous Q4H Doutova, Anastassia, MD   1 Units at 06/28/23 0830   metoprolol succinate (TOPROL-XL) 24 hr tablet 25 mg  25 mg Oral Daily Doutova, Anastassia, MD       ondansetron (ZOFRAN) tablet 4 mg  4 mg Oral Q6H PRN Doutova, Anastassia, MD       Or   ondansetron (ZOFRAN) injection 4 mg  4 mg Intravenous Q6H PRN Doutova, Anastassia, MD       pantoprazole (PROTONIX) injection 40 mg  40 mg Intravenous Q12H Therisa Doyne, MD   40 mg at 06/28/23 0608    Allergies as of 06/27/2023   (No Known Allergies)     Review of Systems:    Constitutional: No weight loss, fever or chills Skin: No rash  Cardiovascular: No chest pain   Respiratory: No SOB Gastrointestinal: See HPI and otherwise negative Genitourinary: No dysuria  Neurological: No headache, dizziness or syncope Musculoskeletal: No new muscle or joint pain Hematologic: No bruising Psychiatric: No history of  depression or anxiety    Physical Exam:  Vital signs in last 24 hours: Temp:  [97.7 F (36.5 C)-98.3 F (36.8 C)] 97.8 F (36.6 C) (12/11 0737) Pulse Rate:  [47-58] 48 (12/11 0737) Resp:  [12-18] 18 (12/11 0737) BP: (  120-148)/(56-73) 123/60 (12/11 0737) SpO2:  [96 %-100 %] 97 % (12/11 0737) Weight:  [98 kg] 98 kg (12/10 0945) Last BM Date : 06/26/23 General:   Pleasant Caucasian male appears to be in NAD, Well developed, Well nourished, alert and cooperative Head:  Normocephalic and atraumatic. Eyes:   PEERL, EOMI. No icterus. Conjunctiva pink. Ears:  Normal auditory acuity. Neck:  Supple Throat: Oral cavity and pharynx without inflammation, swelling or lesion. Teeth in good condition. Lungs: Respirations even and unlabored. Lungs clear to auscultation bilaterally.   No wheezes, crackles, or rhonchi.  Heart: Normal S1, S2. No MRG. Regular rate and rhythm. No peripheral edema, cyanosis or pallor.  Abdomen:  Soft, nondistended, nontender. No rebound or guarding. Normal bowel sounds. No appreciable masses or hepatomegaly. Rectal:  Not performed.  Msk:  Symmetrical without gross deformities. Peripheral pulses intact.  Extremities:  Without edema, no deformity or joint abnormality. Normal ROM, normal sensation. Neurologic:  Alert and  oriented x4;  grossly normal neurologically.  Skin:   Dry and intact without significant lesions or rashes. Psychiatric: Demonstrates good judgement and reason without abnormal affect or behaviors.   LAB RESULTS: Recent Labs    06/27/23 2312 06/28/23 0527 06/28/23 0743  WBC 7.2 6.3 6.2  HGB 12.7* 12.8* 12.7*  HCT 38.2* 37.0* 37.0*  PLT 105* 115* 120*   BMET Recent Labs    06/27/23 1650 06/28/23 0743  NA 143 140  K 4.7 4.5  CL 110 109  CO2 24 25  GLUCOSE 120* 138*  BUN 27* 22  CREATININE 1.36* 1.49*  CALCIUM 9.6 9.1   LFT Recent Labs    06/28/23 0743  PROT 5.8*  ALBUMIN 3.3*  AST 29  ALT 27  ALKPHOS 33*  BILITOT 1.0    PT/INR Recent Labs    06/27/23 2312  LABPROT 14.6  INR 1.1    Impression / Plan:   Impression: 1.  Melena: For the 3 days-last melenic stool on Monday, 06/26/2023, some generalized abdominal discomfort but no real pain, no prior episodes of this, is on aspirin 81 mg daily since 2005, no other NSAID use, hemoglobin stable at 12.7 overnight, BUN minimally elevated at admission 27--> 22 now; most likely upper GI bleed 2.  Thrombocytopenia: Chronic and stable 3.  OSA on CPAP 4.  Type 2 diabetes 5.  CKD stage III 6.  Sinus bradycardia: Chronic and stable  Plan: 1.  We will schedule patient for diagnostic EGD today with Dr. Myrtie Neither.  Did discuss risks, benefits, limitations and alternatives and patient agrees to proceed. 2.  Continue to monitor hemoglobin with transfusion as needed less than 7 3.  Continue Pantoprazole 40 mg IV twice daily 4.  Hold Aspirin for now 5.  Please await further recommendations from Dr. Myrtie Neither after time of procedure today.  This will be scheduled around 2/230.  Thank you for your kind consultation, we will continue to follow.  Violet Baldy [facility]  06/28/2023, 8:57 AM  I have taken an interval history, thoroughly reviewed the chart and examined the patient. I agree with the Advanced Practitioner's note, impression and recommendations, and have recorded additional findings, impressions and recommendations below. I performed a substantive portion of this encounter (>50% time spent), including a complete performance of the medical decision making.  My additional thoughts are as follows:  Hemodynamically stable GI bleed, suspected to be most likely an upper source in a patient on 81 mg aspirin for history of CAD.  Melena several days ago, though none for  about 48 hours.  Hemoglobin remained stable from admission and only mildly decreased.  Chronic stable thrombocytopenia.  Normal INR.  No abdominal pain, vomiting, weight loss or other chronic digestive symptoms.   Up-to-date on colorectal cancer screening with surveillance colonoscopy by Dr. Lavon Paganini in September this year.  The plan is for an upper endoscopy today.  He was seen in the endoscopy preprocedure area, and was agreeable after discussion of the procedure and risks.  The benefits and risks of the planned procedure were described in detail with the patient or (when appropriate) their health care proxy.  Risks were outlined as including, but not limited to, bleeding, infection, perforation, adverse medication reaction leading to cardiac or pulmonary decompensation, pancreatitis (if ERCP).  The limitation of incomplete mucosal visualization was also discussed.  No guarantees or warranties were given.  If low risk findings, I am hopeful he may be ready for discharge later today.   Charlie Pitter III [phone]

## 2023-06-28 NOTE — Assessment & Plan Note (Signed)
Appears dehydrated will rehydrate order urine electrolytes continue to follow renal function

## 2023-06-28 NOTE — Assessment & Plan Note (Signed)
-  chronic avoid nephrotoxic medications such as NSAIDs, Vanco Zosyn combo,  avoid hypotension, continue to follow renal function  

## 2023-06-28 NOTE — Discharge Summary (Signed)
Physician Discharge Summary  Anthony Hunt:[id number] DOB: [birth year] DOA: 06/27/2023  PCP: Karie Schwalbe, MD  Admit date: 06/27/2023 Discharge date: 06/28/2023  Admitted From: Home Disposition:  Home  Recommendations for Outpatient Follow-up:  Follow up with PCP in 1-2 weeks  Home Health:None  Equipment/Devices:None  Discharge Condition:Stable  CODE STATUS:Full  Diet recommendation:  Low carb diet  Brief/Interim Summary: Patient is a pleasant 72 year old male who presents with worsening melena over 4 days prior to hospitalization on 06/27/2023.  Patient has known history of diabetes type 2, CAD, chronic thrombocytopenia sleep apnea hyperlipidemia and hypertension.  Patient presents with 4 days of worsening epigastric discomfort without overt pain or nausea.  Given mild anemia from baseline and abnormal abdominal symptoms GI was consulted with upper EGD today on 06/28/2023, noted to have nonbleeding gastric ulcer without stigmata of bleeding.  Previous gastric erosions also without stigmata of recent bleeding.  Salmon-colored mucosa suspicious for short segment Barrett's noted, biopsies taken per GI, recommendations to hold aspirin, continue PPI twice daily for 8 weeks, resume regular diet and otherwise stable and agreeable for discharge home.  Patient agreeable for discharge home given stability and reassuring findings.  Outpatient follow-up with PCP and GI.  Discharge Diagnoses:  Principal Problem:   Melena Active Problems:   Hyperlipemia   Coronary atherosclerosis of native coronary artery   Obstructive sleep apnea   Thrombocytopenia (HCC)   Type 2 diabetes mellitus with circulatory disorder (HCC)   Elevated PSA   AKI (acute kidney injury) (HCC)   CKD (chronic kidney disease) stage 3, GFR 30-59 ml/min (HCC)   Sinus bradycardia   Chronic gastric ulcer with hemorrhage   Duodenal ulcer   Acute GI bleeding    Discharge Instructions   Allergies as of 06/28/2023    No Known Allergies      Medication List     STOP taking these medications    aspirin 81 MG tablet   benazepril 10 MG tablet Commonly known as: LOTENSIN       TAKE these medications    empagliflozin 10 MG Tabs tablet Commonly known as: JARDIANCE Take 1 tablet (10 mg total) by mouth daily before breakfast.   glipiZIDE 5 MG 24 hr tablet Commonly known as: GLUCOTROL XL TAKE 1 TABLET EVERY DAY WITH BREAKFAST   glucose blood test strip Use to check blood sugar once a day. Dx Code E11.59   metFORMIN 500 MG 24 hr tablet Commonly known as: GLUCOPHAGE-XR TAKE 2 TABLETS EVERY DAY WITH BREAKFAST   metoprolol succinate 25 MG 24 hr tablet Commonly known as: TOPROL-XL Take 1 tablet (25 mg total) by mouth daily.   OneTouch Delica Lancets 30G Misc 1 each by Does not apply route daily. Use to check blood sugar daily. Dx Code E11.59   pantoprazole 40 MG tablet Commonly known as: Protonix Take 1 tablet (40 mg total) by mouth 2 (two) times daily.   simvastatin 80 MG tablet Commonly known as: ZOCOR Take 1 tablet (80 mg total) by mouth daily at 6 PM.        No Known Allergies  Consultations: GI, [place]  Procedures/Studies: No results found.   Subjective: No acute issues or events overnight, tolerated endoscopy well otherwise denies nausea vomiting diarrhea constipation any fevers chills or chest pain   Discharge Exam: Vitals:   06/28/23 1510 06/28/23 1531  BP: 134/60 122/68  Pulse: (!) 55 (!) 53  Resp: (!) 3 16  Temp:  98.7 F (37.1 C)  SpO2: 97% 96%  Vitals:   06/28/23 1450 06/28/23 1500 06/28/23 1510 06/28/23 1531  BP: (!) 120/52 130/61 134/60 122/68  Pulse: (!) 56 (!) 56 (!) 55 (!) 53  Resp: (!) 23 (!) 21 (!) 3 16  Temp:    98.7 F (37.1 C)  TempSrc:    Oral  SpO2: 92% 95% 97% 96%  Weight:      Height:        General: Pt is alert, awake, not in acute distress Cardiovascular: RRR, S1/S2 +, no rubs, no gallops Respiratory: CTA bilaterally, no  wheezing, no rhonchi Abdominal: Soft, NT, ND, bowel sounds + Extremities: no edema, no cyanosis    The results of significant diagnostics from this hospitalization (including imaging, microbiology, ancillary and laboratory) are listed below for reference.     Microbiology: No results found for this or any previous visit (from the past 240 hour(s)).   Labs: BNP (last 3 results) No results for input(s): "BNP" in the last 8760 hours. Basic Metabolic Panel: Recent Labs  Lab 06/27/23 1650 06/27/23 2312 06/28/23 0743  NA 143  --  140  K 4.7  --  4.5  CL 110  --  109  CO2 24  --  25  GLUCOSE 120*  --  138*  BUN 27*  --  22  CREATININE 1.36*  --  1.49*  CALCIUM 9.6  --  9.1  MG  --  2.0 2.0  PHOS  --  3.1 3.5   Liver Function Tests: Recent Labs  Lab 06/27/23 1650 06/28/23 0743  AST 31 29  ALT 26 27  ALKPHOS 37* 33*  BILITOT 1.1 1.0  PROT 6.3* 5.8*  ALBUMIN 3.7 3.3*   No results for input(s): "LIPASE", "AMYLASE" in the last 168 hours. No results for input(s): "AMMONIA" in the last 168 hours. CBC: Recent Labs  Lab 06/27/23 1650 06/27/23 2312 06/28/23 0527 06/28/23 0743 06/28/23 0956  WBC 7.5 7.2 6.3 6.2 5.9  HGB 13.8 12.7* 12.8* 12.7* 12.9*  HCT 41.3 38.2* 37.0* 37.0* 37.1*  MCV 100.5* 99.5 96.4 97.9 96.4  PLT 132* 105* 115* 120* 122*   Cardiac Enzymes: Recent Labs  Lab 06/27/23 2312  CKTOTAL 71   BNP: Invalid input(s): "POCBNP" CBG: Recent Labs  Lab 06/28/23 0559 06/28/23 0736 06/28/23 1150 06/28/23 1455 06/28/23 1628  GLUCAP 134* 130* 106* 79 110*   D-Dimer No results for input(s): "DDIMER" in the last 72 hours. Hgb A1c Recent Labs    06/27/23 1650  HGBA1C 6.0*   Lipid Profile No results for input(s): "CHOL", "HDL", "LDLCALC", "TRIG", "CHOLHDL", "LDLDIRECT" in the last 72 hours. Thyroid function studies Recent Labs    06/27/23 1650  TSH 1.599   Anemia work up No results for input(s): "VITAMINB12", "FOLATE", "FERRITIN", "TIBC",  "IRON", "RETICCTPCT" in the last 72 hours. Urinalysis    Component Value Date/Time   COLORURINE YELLOW 06/27/2023 1010   APPEARANCEUR CLEAR 06/27/2023 1010   APPEARANCEUR Clear 06/22/2020 1327   LABSPEC 1.022 06/27/2023 1010   PHURINE 5.0 06/27/2023 1010   GLUCOSEU >=500 (A) 06/27/2023 1010   HGBUR NEGATIVE 06/27/2023 1010   BILIRUBINUR NEGATIVE 06/27/2023 1010   BILIRUBINUR Negative 06/22/2020 1327   KETONESUR NEGATIVE 06/27/2023 1010   PROTEINUR NEGATIVE 06/27/2023 1010   NITRITE NEGATIVE 06/27/2023 1010   LEUKOCYTESUR SMALL (A) 06/27/2023 1010   Sepsis Labs Recent Labs  Lab 06/27/23 2312 06/28/23 0527 06/28/23 0743 06/28/23 0956  WBC 7.2 6.3 6.2 5.9   Microbiology No results found for this or any previous  visit (from the past 240 hour(s)).   Time coordinating discharge: Over 30 minutes  SIGNED:   Azucena Fallen, DO [facility] 06/28/2023, 4:45 PM Pager   If 7PM-7AM, please contact night-coverage [email or web address]

## 2023-06-28 NOTE — Plan of Care (Signed)
  Problem: Education: Goal: Ability to describe self-care measures that may prevent or decrease complications (Diabetes Survival Skills Education) will improve Outcome: Progressing   Problem: Coping: Goal: Ability to adjust to condition or change in health will improve Outcome: Progressing   Problem: Nutritional: Goal: Maintenance of adequate nutrition will improve Outcome: Progressing   Problem: Activity: Goal: Risk for activity intolerance will decrease Outcome: Progressing   Problem: Elimination: Goal: Will not experience complications related to bowel motility Outcome: Progressing   Problem: Pain Management: Goal: General experience of comfort will improve Outcome: Progressing   Problem: Safety: Goal: Ability to remain free from injury will improve Outcome: Progressing

## 2023-06-28 NOTE — Progress Notes (Signed)
Patient discharged to home, AVS reviewed and all questions answered. Patient will schedule follow-up appointments.

## 2023-06-28 NOTE — Interval H&P Note (Signed)
History and Physical Interval Note:  06/28/2023 1:29 PM  Anthony Hunt  has presented today for surgery, with the diagnosis of Melena.  The various methods of treatment have been discussed with the patient and family. After consideration of risks, benefits and other options for treatment, the patient has consented to  Procedure(s): ESOPHAGOGASTRODUODENOSCOPY (EGD) WITH PROPOFOL (N/A) as a surgical intervention.  The patient's history has been reviewed, patient examined, no change in status, stable for surgery.  I have reviewed the patient's chart and labs.  Questions were answered to the patient's satisfaction.     Charlie Pitter III

## 2023-06-28 NOTE — H&P (View-Only) (Signed)
Consultation  Referring Provider:   Dr. Natale Milch Primary Care Physician:  Karie Schwalbe, MD Primary Gastroenterologist:    Dr. Lavon Paganini     Reason for Consultation: Melena            HPI:   Anthony Hunt is a 72 y.o. male with a past medical history as listed below including diabetes, CAD, thrombocytopenia, OSA, hypertension and multiple others, who presented to the ER on 06/27/2023 with melena.    At time of admission patient described melena for the past 4 days.  Does report using Aspirin 81 mg daily for CAD.  Describes some weakness.    Today, patient tells me that he started feeling sort of "yucky" in his abdomen around Saturday and had 2-3 episodes of black tarry sticky stool.  This continued on Sunday and Monday with the same amount of stools.  No nausea or vomiting.  He has not had a bowel movement since Monday, 06/26/2023.  He is on 81 mg of aspirin for CAD and has been since 2005, he did use 1 dose of Ibuprofen on Saturday, but usually does not use NSAIDs and is in fairly good health otherwise.    Denies fever, chills or weight loss.  ER course: Fecal occult positive, platelets 132, normal hemoglobin, BUN 27, alk phos 37, EKG with sinus bradycardia and heart rate 54, pressure soft 140/65  GI history: 04/17/2023 colonoscopy for history of polyps with 2 to 3-4 mm polyps in the sigmoid colon and descending colon, moderate diverticulosis in the sigmoid, descending, transverse and ascending colon as well as nonbleeding external and internal hemorrhoids, pathology with hyperplastic polyps; repeat recommended in 5 years  Past Medical History:  Diagnosis Date   Allergy    Bright's disease    age [age]  no issues since    CAD (coronary artery disease) 2005   MI   Diabetes mellitus without complication (HCC)    Hyperlipidemia    Hypertension    Myocardial infarction (HCC) 2005   Pre-diabetes    Sleep apnea    Uses Cpap    Past Surgical History:  Procedure Laterality Date    CERVICAL DISC SURGERY     CORONARY STENT PLACEMENT  2005   ULNAR NERVE REPAIR     Left ulnar nerve decompression 02/07   US ECHOCARDIOGRAPHY      mild inf hypokinesis, EF 55%  09/05    Family History  Problem Relation Age of Onset   Coronary artery disease Mother    Peripheral vascular disease Mother    Cancer Maternal Uncle    Cancer Paternal Aunt    Diabetes Neg Hx    Hypertension Neg Hx    Colon cancer Neg Hx    Colon polyps Neg Hx    Esophageal cancer Neg Hx    Rectal cancer Neg Hx    Stomach cancer Neg Hx    Bladder Cancer Neg Hx    Prostate cancer Neg Hx    Kidney cancer Neg Hx     Social History   Tobacco Use   Smoking status: Never    Passive exposure: Past   Smokeless tobacco: Current    Types: Chew   Tobacco comments:    Occasionally, couple times a week (on the weekends).  12/24/2021 hfb  Vaping Use   Vaping status: Never Used  Substance Use Topics   Alcohol use: No    Alcohol/week: 0.0 standard drinks of alcohol   Drug use: No  Prior to Admission medications   Medication Sig Start Date End Date Taking? Authorizing Provider  aspirin 81 MG tablet Take 81 mg by mouth daily.   Yes [provider]  benazepril (LOTENSIN) 10 MG tablet Take 1 tablet (10 mg total) by mouth daily. 05/31/23  Yes Karie Schwalbe, MD  empagliflozin (JARDIANCE) 10 MG TABS tablet Take 1 tablet (10 mg total) by mouth daily before breakfast. 01/05/23  Yes Tillman Abide I, MD  glipiZIDE (GLUCOTROL XL) 5 MG 24 hr tablet TAKE 1 TABLET EVERY DAY WITH BREAKFAST 10/31/22  Yes Tillman Abide I, MD  metFORMIN (GLUCOPHAGE-XR) 500 MG 24 hr tablet TAKE 2 TABLETS EVERY DAY WITH BREAKFAST 06/01/23  Yes Karie Schwalbe, MD  metoprolol succinate (TOPROL-XL) 25 MG 24 hr tablet Take 1 tablet (25 mg total) by mouth daily. 05/31/23  Yes Karie Schwalbe, MD  simvastatin (ZOCOR) 80 MG tablet Take 1 tablet (80 mg total) by mouth daily at 6 PM. 05/31/23  Yes Tillman Abide I, MD  glucose  blood test strip Use to check blood sugar once a day. Dx Code E11.59 06/02/20   Karie Schwalbe, MD  OneTouch Delica Lancets 30G MISC 1 each by Does not apply route daily. Use to check blood sugar daily. Dx Code E11.59 06/02/20   Karie Schwalbe, MD    Current Facility-Administered Medications  Medication Dose Route Frequency Provider Last Rate Last Admin   0.9 %  sodium chloride infusion   Intravenous Continuous Therisa Doyne, MD 75 mL/hr at 06/28/23 0611 New Bag at 06/28/23 1610   acetaminophen (TYLENOL) tablet 650 mg  650 mg Oral Q6H PRN Therisa Doyne, MD       Or   acetaminophen (TYLENOL) suppository 650 mg  650 mg Rectal Q6H PRN Therisa Doyne, MD       aspirin EC tablet 81 mg  81 mg Oral Daily Doutova, Anastassia, MD       atorvastatin (LIPITOR) tablet 40 mg  40 mg Oral Daily Doutova, Anastassia, MD       HYDROcodone-acetaminophen (NORCO/VICODIN) 5-325 MG per tablet 1-2 tablet  1-2 tablet Oral Q4H PRN Doutova, Anastassia, MD       insulin aspart (novoLOG) injection 0-9 Units  0-9 Units Subcutaneous Q4H Doutova, Anastassia, MD   1 Units at 06/28/23 0830   metoprolol succinate (TOPROL-XL) 24 hr tablet 25 mg  25 mg Oral Daily Doutova, Anastassia, MD       ondansetron (ZOFRAN) tablet 4 mg  4 mg Oral Q6H PRN Doutova, Anastassia, MD       Or   ondansetron (ZOFRAN) injection 4 mg  4 mg Intravenous Q6H PRN Doutova, Anastassia, MD       pantoprazole (PROTONIX) injection 40 mg  40 mg Intravenous Q12H Therisa Doyne, MD   40 mg at 06/28/23 0608    Allergies as of 06/27/2023   (No Known Allergies)     Review of Systems:    Constitutional: No weight loss, fever or chills Skin: No rash  Cardiovascular: No chest pain   Respiratory: No SOB Gastrointestinal: See HPI and otherwise negative Genitourinary: No dysuria  Neurological: No headache, dizziness or syncope Musculoskeletal: No new muscle or joint pain Hematologic: No bruising Psychiatric: No history of  depression or anxiety    Physical Exam:  Vital signs in last 24 hours: Temp:  [97.7 F (36.5 C)-98.3 F (36.8 C)] 97.8 F (36.6 C) (12/11 0737) Pulse Rate:  [47-58] 48 (12/11 0737) Resp:  [12-18] 18 (12/11 0737) BP: (  120-148)/(56-73) 123/60 (12/11 0737) SpO2:  [96 %-100 %] 97 % (12/11 0737) Weight:  [98 kg] 98 kg (12/10 0945) Last BM Date : 06/26/23 General:   Pleasant Caucasian male appears to be in NAD, Well developed, Well nourished, alert and cooperative Head:  Normocephalic and atraumatic. Eyes:   PEERL, EOMI. No icterus. Conjunctiva pink. Ears:  Normal auditory acuity. Neck:  Supple Throat: Oral cavity and pharynx without inflammation, swelling or lesion. Teeth in good condition. Lungs: Respirations even and unlabored. Lungs clear to auscultation bilaterally.   No wheezes, crackles, or rhonchi.  Heart: Normal S1, S2. No MRG. Regular rate and rhythm. No peripheral edema, cyanosis or pallor.  Abdomen:  Soft, nondistended, nontender. No rebound or guarding. Normal bowel sounds. No appreciable masses or hepatomegaly. Rectal:  Not performed.  Msk:  Symmetrical without gross deformities. Peripheral pulses intact.  Extremities:  Without edema, no deformity or joint abnormality. Normal ROM, normal sensation. Neurologic:  Alert and  oriented x4;  grossly normal neurologically.  Skin:   Dry and intact without significant lesions or rashes. Psychiatric: Demonstrates good judgement and reason without abnormal affect or behaviors.   LAB RESULTS: Recent Labs    06/27/23 2312 06/28/23 0527 06/28/23 0743  WBC 7.2 6.3 6.2  HGB 12.7* 12.8* 12.7*  HCT 38.2* 37.0* 37.0*  PLT 105* 115* 120*   BMET Recent Labs    06/27/23 1650 06/28/23 0743  NA 143 140  K 4.7 4.5  CL 110 109  CO2 24 25  GLUCOSE 120* 138*  BUN 27* 22  CREATININE 1.36* 1.49*  CALCIUM 9.6 9.1   LFT Recent Labs    06/28/23 0743  PROT 5.8*  ALBUMIN 3.3*  AST 29  ALT 27  ALKPHOS 33*  BILITOT 1.0    PT/INR Recent Labs    06/27/23 2312  LABPROT 14.6  INR 1.1    Impression / Plan:   Impression: 1.  Melena: For the 3 days-last melenic stool on Monday, 06/26/2023, some generalized abdominal discomfort but no real pain, no prior episodes of this, is on aspirin 81 mg daily since 2005, no other NSAID use, hemoglobin stable at 12.7 overnight, BUN minimally elevated at admission 27--> 22 now; most likely upper GI bleed 2.  Thrombocytopenia: Chronic and stable 3.  OSA on CPAP 4.  Type 2 diabetes 5.  CKD stage III 6.  Sinus bradycardia: Chronic and stable  Plan: 1.  We will schedule patient for diagnostic EGD today with Dr. Myrtie Neither.  Did discuss risks, benefits, limitations and alternatives and patient agrees to proceed. 2.  Continue to monitor hemoglobin with transfusion as needed less than 7 3.  Continue Pantoprazole 40 mg IV twice daily 4.  Hold Aspirin for now 5.  Please await further recommendations from Dr. Myrtie Neither after time of procedure today.  This will be scheduled around 2/230.  Thank you for your kind consultation, we will continue to follow.  Violet Baldy [facility]  06/28/2023, 8:57 AM  I have taken an interval history, thoroughly reviewed the chart and examined the patient. I agree with the Advanced Practitioner's note, impression and recommendations, and have recorded additional findings, impressions and recommendations below. I performed a substantive portion of this encounter (>50% time spent), including a complete performance of the medical decision making.  My additional thoughts are as follows:  Hemodynamically stable GI bleed, suspected to be most likely an upper source in a patient on 81 mg aspirin for history of CAD.  Melena several days ago, though none for  about 48 hours.  Hemoglobin remained stable from admission and only mildly decreased.  Chronic stable thrombocytopenia.  Normal INR.  No abdominal pain, vomiting, weight loss or other chronic digestive symptoms.   Up-to-date on colorectal cancer screening with surveillance colonoscopy by Dr. Lavon Paganini in September this year.  The plan is for an upper endoscopy today.  He was seen in the endoscopy preprocedure area, and was agreeable after discussion of the procedure and risks.  The benefits and risks of the planned procedure were described in detail with the patient or (when appropriate) their health care proxy.  Risks were outlined as including, but not limited to, bleeding, infection, perforation, adverse medication reaction leading to cardiac or pulmonary decompensation, pancreatitis (if ERCP).  The limitation of incomplete mucosal visualization was also discussed.  No guarantees or warranties were given.  If low risk findings, I am hopeful he may be ready for discharge later today.   Charlie Pitter III [phone]

## 2023-06-28 NOTE — Transfer of Care (Signed)
Immediate Anesthesia Transfer of Care Note  Patient: Anthony Hunt  Procedure(s) Performed: ESOPHAGOGASTRODUODENOSCOPY (EGD) WITH PROPOFOL BIOPSY  Patient Location: PACU and Endoscopy Unit  Anesthesia Type:MAC  Level of Consciousness: awake and drowsy  Airway & Oxygen Therapy: Patient Spontanous Breathing  Post-op Assessment: Report given to RN and Post -op Vital signs reviewed and stable  Post vital signs: Reviewed and stable  Last Vitals:  Vitals Value Taken Time  BP 120/48 06/28/23 1446  Temp    Pulse 54 06/28/23 1449  Resp 23 06/28/23 1449  SpO2 94 % 06/28/23 1449  Vitals shown include unfiled device data.  Last Pain:  Vitals:   06/28/23 1446  TempSrc:   PainSc: 0-No pain         Complications: No notable events documented.

## 2023-06-28 NOTE — Op Note (Signed)
[facility] Patient Name: Anthony Hunt Procedure Date : 06/28/2023 MRN: [id number] Attending MD: Starr Lake. Myrtie Neither , MD, [id number] Date of Birth: [birth year] CSN: [id number] Age: 72 Admit Type: Inpatient Procedure:                Upper GI endoscopy Indications:              Melena                           clinical details in office consult note today Providers:                Sherilyn Cooter L. Myrtie Neither, MD, Fransisca Connors, Salley Scarlet, Technician, Stephanie Coup, CRNA Referring MD:             [facility] Medicines:                Monitored Anesthesia Care Complications:            No immediate complications. Estimated Blood Loss:     Estimated blood loss was minimal. Procedure:                Pre-Anesthesia Assessment:                           - Prior to the procedure, a History and Physical                            was performed, and patient medications and                            allergies were reviewed. The patient's tolerance of                            previous anesthesia was also reviewed. The risks                            and benefits of the procedure and the sedation                            options and risks were discussed with the patient.                            All questions were answered, and informed consent                            was obtained. Prior Anticoagulants: The patient has                            taken no anticoagulant or antiplatelet agents                            except for aspirin. ASA Grade Assessment: II - A  patient with mild systemic disease. After reviewing                            the risks and benefits, the patient was deemed in                            satisfactory condition to undergo the procedure.                           After obtaining informed consent, the endoscope was                            passed under direct vision. Throughout the                             procedure, the patient's blood pressure, pulse, and                            oxygen saturations were monitored continuously. The                            [id number]-H190 ([id number]) Olympus endoscope was introduced                            through the mouth, and advanced to the second part                            of duodenum. The upper GI endoscopy was                            accomplished without difficulty. The patient                            tolerated the procedure well. Scope In: Scope Out: Findings:      Circumferential salmon-colored mucosa was present from 38 to 40 cm. No       other visible abnormalities were present under WL (NBI not available).       The maximum longitudinal extent of these esophageal mucosal changes was       2 cm in length. Biopsies were taken with a cold forceps for histology.       (two bottles - 38 and 40cm).      One non-bleeding superficial gastric ulcer with no stigmata of bleeding       was found at the incisura. The lesion was 5 mm in largest dimension.      Multiple localized erosions with no bleeding and no stigmata of recent       bleeding were found in the prepyloric region of the stomach. Biopsies       were taken with a cold forceps for histology. ( antrum and body to r/o H       pylori)      The exam of the stomach was otherwise normal.      One non-bleeding cratered duodenal ulcer was found in the duodenal       bulb/proximal sweep (anterior). The lesion was 10 mm in largest  dimension. There was a diminutive flat red spot and no high grade       bleeding stigmata. No endoscopic intervention required.      The exam of the duodenum was otherwise normal. Despite some edema       adjacent to the ulcer, the scope passed easily to the second portion.      No fresh or old blood in the UGI tract. Impression:               - Salmon-colored mucosa suspicious for                            short-segment Barrett's  esophagus. Biopsied.                           - Non-bleeding gastric ulcer with no stigmata of                            bleeding.                           - Gastric erosions with no bleeding and no stigmata                            of recent bleeding. Biopsied.                           - Non-bleeding duodenal ulcer. Recommendation:           - Return patient to hospital ward for possible                            discharge same day.                           - Resume regular diet.                           - Stop aspirin                           Pantoprazole 40 mg by mouth twice daily x 8 weeks.                           Biopsy results will be followed up after discharge                            , the patient contacted, and follow up with Dr                            Lavon Paganini arranged. Procedure Code(s):        --- Professional ---                           [phone], Esophagogastroduodenoscopy, flexible,                            transoral; with biopsy, single or multiple Diagnosis  Code(s):        --- Professional ---                           K22.89, Other specified disease of esophagus                           K25.9, Gastric ulcer, unspecified as acute or                            chronic, without hemorrhage or perforation                           K26.9, Duodenal ulcer, unspecified as acute or                            chronic, without hemorrhage or perforation                           K92.1, Melena (includes Hematochezia) CPT copyright 2022 American [employer]. All rights reserved. The codes documented in this report are preliminary and upon coder review may  be revised to meet current compliance requirements. Lemon Sternberg L. Myrtie Neither, MD 06/28/2023 3:00:52 PM This report has been signed electronically. Number of Addenda: 0

## 2023-06-28 NOTE — Assessment & Plan Note (Signed)
-   Glasgow Blatchford score BUN >18.2         Modifying risk factors include:     NSAIDS use      -  ER  Provider spoke to gastroenterology ( [facility]) they will see patient in a.m. appreciate their consult   - serial CBC.    - Monitor for any recurrence,  evidence of hemodynamic instability or significant blood loss  - Transfuse as needed for hemoglobin below 7 or evidence of life-threatening bleeding  - Establish at least 2 PIV and fluid resuscitate   - clear liquids for tonight keep nothing by mouth post midnight,   -  administer Protonix   twice a day

## 2023-06-28 NOTE — Anesthesia Postprocedure Evaluation (Signed)
Anesthesia Post Note  Patient: Anthony Hunt  Procedure(s) Performed: ESOPHAGOGASTRODUODENOSCOPY (EGD) WITH PROPOFOL BIOPSY     Patient location during evaluation: Endoscopy Anesthesia Type: MAC Level of consciousness: awake and alert Pain management: pain level controlled Vital Signs Assessment: post-procedure vital signs reviewed and stable Respiratory status: spontaneous breathing, nonlabored ventilation and respiratory function stable Cardiovascular status: stable and blood pressure returned to baseline Postop Assessment: no apparent nausea or vomiting Anesthetic complications: no  No notable events documented.  Last Vitals:  Vitals:   06/28/23 1500 06/28/23 1510  BP: 130/61 134/60  Pulse: (!) 56 (!) 55  Resp: (!) 21 (!) 3  Temp:    SpO2: 95% 97%    Last Pain:  Vitals:   06/28/23 1510  TempSrc:   PainSc: 0-No pain                 Deo Mehringer,W. EDMOND

## 2023-06-28 NOTE — Assessment & Plan Note (Signed)
Chronic stable Hold aspirin for tonight allow permissive hypertension continue Zocor 80 mg a day when able resume Toprol

## 2023-06-29 ENCOUNTER — Encounter (HOSPITAL_COMMUNITY): Payer: Self-pay | Admitting: Gastroenterology

## 2023-06-29 ENCOUNTER — Ambulatory Visit: Payer: Medicare HMO | Admitting: Internal Medicine

## 2023-06-30 LAB — SURGICAL PATHOLOGY

## 2023-07-04 ENCOUNTER — Encounter: Payer: Self-pay | Admitting: Internal Medicine

## 2023-07-04 ENCOUNTER — Ambulatory Visit (INDEPENDENT_AMBULATORY_CARE_PROVIDER_SITE_OTHER): Payer: Medicare HMO | Admitting: Internal Medicine

## 2023-07-04 ENCOUNTER — Encounter: Payer: Self-pay | Admitting: Gastroenterology

## 2023-07-04 VITALS — BP 124/60 | HR 52 | Temp 98.3°F | Ht 67.0 in | Wt 222.0 lb

## 2023-07-04 DIAGNOSIS — N179 Acute kidney failure, unspecified: Secondary | ICD-10-CM

## 2023-07-04 DIAGNOSIS — Z7984 Long term (current) use of oral hypoglycemic drugs: Secondary | ICD-10-CM | POA: Diagnosis not present

## 2023-07-04 DIAGNOSIS — E1159 Type 2 diabetes mellitus with other circulatory complications: Secondary | ICD-10-CM | POA: Diagnosis not present

## 2023-07-04 DIAGNOSIS — K2270 Barrett's esophagus without dysplasia: Secondary | ICD-10-CM | POA: Insufficient documentation

## 2023-07-04 DIAGNOSIS — K269 Duodenal ulcer, unspecified as acute or chronic, without hemorrhage or perforation: Secondary | ICD-10-CM | POA: Diagnosis not present

## 2023-07-04 LAB — CBC
HCT: 39.4 % (ref 39.0–52.0)
Hemoglobin: 13.2 g/dL (ref 13.0–17.0)
MCHC: 33.4 g/dL (ref 30.0–36.0)
MCV: 100.7 fL — ABNORMAL HIGH (ref 78.0–100.0)
Platelets: 162 10*3/uL (ref 150.0–400.0)
RBC: 3.92 Mil/uL — ABNORMAL LOW (ref 4.22–5.81)
RDW: 14.4 % (ref 11.5–15.5)
WBC: 6.1 10*3/uL (ref 4.0–10.5)

## 2023-07-04 LAB — RENAL FUNCTION PANEL
Albumin: 4.1 g/dL (ref 3.5–5.2)
BUN: 19 mg/dL (ref 6–23)
CO2: 29 meq/L (ref 19–32)
Calcium: 9.1 mg/dL (ref 8.4–10.5)
Chloride: 107 meq/L (ref 96–112)
Creatinine, Ser: 1.31 mg/dL (ref 0.40–1.50)
GFR: 55.21 mL/min — ABNORMAL LOW (ref >60.00)
Glucose, Bld: 160 mg/dL — ABNORMAL HIGH (ref 70–99)
Phosphorus: 2.9 mg/dL (ref 2.3–4.6)
Potassium: 4.5 meq/L (ref 3.5–5.1)
Sodium: 142 meq/L (ref 135–145)

## 2023-07-04 MED ORDER — PANTOPRAZOLE SODIUM 40 MG PO TBEC: 40.0000 mg | DELAYED_RELEASE_TABLET | Freq: Every day | ORAL | 3 refills | Status: DC

## 2023-07-04 NOTE — Progress Notes (Signed)
Subjective:    Patient ID: Anthony Hunt, male    DOB: 1951/02/23, 72 y.o.   MRN: [id number]  HPI Here for hospital follow up  Started passing "jet black" stool---though no bad smell Went a couple of days later to ER---when started feeling weak and "washed out" Hemoglobin dropped by 3--but then stabilized  Only on ASA 81 daily---no NSAIDs Now taking pantoprazole bid still   No known prior ulcer that he remembers--but he may have had a problem many years ago  Has not been checking sugars at home Lab Results  Component Value Date   HGBA1C 6.0 (H) 06/27/2023   No chest pain No SOB Benazapril also stopped---GFR had dropped slightly Biopsy of esophagus showed Barrett's  Current Outpatient Medications on File Prior to Visit  Medication Sig Dispense Refill   empagliflozin (JARDIANCE) 10 MG TABS tablet Take 1 tablet (10 mg total) by mouth daily before breakfast. 90 tablet 3   glipiZIDE (GLUCOTROL XL) 5 MG 24 hr tablet TAKE 1 TABLET EVERY DAY WITH BREAKFAST 90 tablet 3   glucose blood test strip Use to check blood sugar once a day. Dx Code E11.59 100 each 4   metFORMIN (GLUCOPHAGE-XR) 500 MG 24 hr tablet TAKE 2 TABLETS EVERY DAY WITH BREAKFAST 180 tablet 3   metoprolol succinate (TOPROL-XL) 25 MG 24 hr tablet Take 1 tablet (25 mg total) by mouth daily. 90 tablet 3   OneTouch Delica Lancets 30G MISC 1 each by Does not apply route daily. Use to check blood sugar daily. Dx Code E11.59 100 each 4   pantoprazole (PROTONIX) 40 MG tablet Take 1 tablet (40 mg total) by mouth 2 (two) times daily. 112 tablet 0   simvastatin (ZOCOR) 80 MG tablet Take 1 tablet (80 mg total) by mouth daily at 6 PM. 90 tablet 3   No current facility-administered medications on file prior to visit.    No Known Allergies  Past Medical History:  Diagnosis Date   Allergy    Bright's disease    age 65-6  no issues since    CAD (coronary artery disease) 2005   MI   Diabetes mellitus without complication (HCC)     Hyperlipidemia    Hypertension    Myocardial infarction (HCC) 2005   Pre-diabetes    Sleep apnea    Uses Cpap    Past Surgical History:  Procedure Laterality Date   BIOPSY  06/28/2023   Procedure: BIOPSY;  Surgeon: Sherrilyn Rist, MD;  Location: [facility];  Service: Gastroenterology;;   CERVICAL DISC SURGERY     CORONARY STENT PLACEMENT  2005   ESOPHAGOGASTRODUODENOSCOPY (EGD) WITH PROPOFOL N/A 06/28/2023   Procedure: ESOPHAGOGASTRODUODENOSCOPY (EGD) WITH PROPOFOL;  Surgeon: Sherrilyn Rist, MD;  Location: [facility] ENDOSCOPY;  Service: Gastroenterology;  Laterality: N/A;   ULNAR NERVE REPAIR     Left ulnar nerve decompression 02/07   US ECHOCARDIOGRAPHY      mild inf hypokinesis, EF 55%  09/05    Family History  Problem Relation Age of Onset   Coronary artery disease Mother    Peripheral vascular disease Mother    Cancer Maternal Uncle    Cancer Paternal Aunt    Diabetes Neg Hx    Hypertension Neg Hx    Colon cancer Neg Hx    Colon polyps Neg Hx    Esophageal cancer Neg Hx    Rectal cancer Neg Hx    Stomach cancer Neg Hx    Bladder Cancer Neg  Hx    Prostate cancer Neg Hx    Kidney cancer Neg Hx     Social History   Socioeconomic History   Marital status: Married    Spouse name: Not on file   Number of children: 1   Years of education: Not on file   Highest education level: 12th grade  Occupational History   Occupation:  [employer]    Comment: Retired 10/22   Occupation: Invoice work/porter    Comment: Magazine features editor  Tobacco Use   Smoking status: Never    Passive exposure: Past   Smokeless tobacco: Current    Types: Chew   Tobacco comments:    Occasionally, couple times a week (on the weekends).  12/24/2021 hfb  Vaping Use   Vaping status: Never Used  Substance and Sexual Activity   Alcohol use: No    Alcohol/week: 0.0 standard drinks of alcohol   Drug use: No   Sexual activity: Not on file  Other Topics Concern   Not on file  Social  History Narrative   No living will   Wife would be health care POA_--alternate is son   Would accept resuscitation attempts   Not sure about tube feeds   Social Drivers of Health   Financial Resource Strain: Low Risk  (07/03/2023)   Overall Financial Resource Strain (CARDIA)    Difficulty of Paying Living Expenses: Not hard at all  Food Insecurity: No Food Insecurity (07/03/2023)   Hunger Vital Sign    Worried About Running Out of Food in the Last Year: Never true    Ran Out of Food in the Last Year: Never true  Transportation Needs: No Transportation Needs (07/03/2023)   PRAPARE - Administrator, Civil Service (Medical): No    Lack of Transportation (Non-Medical): No  Physical Activity: Insufficiently Active (07/03/2023)   Exercise Vital Sign    Days of Exercise per Week: 2 days    Minutes of Exercise per Session: 10 min  Stress: No Stress Concern Present (07/03/2023)   [employer] of Occupational Health - Occupational Stress Questionnaire    Feeling of Stress : Not at all  Social Connections: Socially Integrated (07/03/2023)   Social Connection and Isolation Panel [NHANES]    Frequency of Communication with Friends and Family: Three times a week    Frequency of Social Gatherings with Friends and Family: Twice a week    Attends Religious Services: More than 4 times per year    Active Member of [employer] or Organizations: Yes    Attends Engineer, structural: More than 4 times per year    Marital Status: Married  Catering manager Violence: Not At Risk (06/27/2023)   Humiliation, Afraid, Rape, and Kick questionnaire    Fear of Current or Ex-Partner: No    Emotionally Abused: No    Physically Abused: No    Sexually Abused: No   Review of Systems Sleeping okay Appetite is fine     Objective:   Physical Exam Constitutional:      Appearance: Normal appearance.  Cardiovascular:     Rate and Rhythm: Normal rate and regular rhythm.     Heart sounds: No  murmur heard.    No gallop.  Pulmonary:     Effort: Pulmonary effort is normal.     Breath sounds: Normal breath sounds. No wheezing or rales.  Abdominal:     Palpations: Abdomen is soft.     Tenderness: There is no abdominal tenderness. There is no  guarding or rebound.  Musculoskeletal:     Cervical back: Neck supple.     Right lower leg: No edema.     Left lower leg: No edema.  Lymphadenopathy:     Cervical: No cervical adenopathy.  Neurological:     Mental Status: He is alert.            Assessment & Plan:

## 2023-07-04 NOTE — Assessment & Plan Note (Signed)
Will plan to continue the pantoprazole daily indefinitely after the 2 months

## 2023-07-04 NOTE — Assessment & Plan Note (Signed)
Off the benazepril Likely prerenal with GI bleed---will recheck and restart the benazepril if normalized

## 2023-07-04 NOTE — Assessment & Plan Note (Signed)
Lab Results  Component Value Date   HGBA1C 6.0 (H) 06/27/2023   Good control on metformin 1000mg  and glipizide 5 mg daily

## 2023-07-04 NOTE — Assessment & Plan Note (Signed)
Apparently ASA related Will stay off the ASA indefinitely Continue bid pantoprazole for 8 weeks

## 2023-10-23 ENCOUNTER — Telehealth: Payer: Self-pay | Admitting: Internal Medicine

## 2023-10-23 NOTE — Telephone Encounter (Signed)
 Copied from CRM [id number]. Topic: General - Call Back - No Documentation >> Oct 23, 2023  3:04 PM Anthony Hunt wrote: Reason for CRM: Patient says he has two missed calls today from this number. No message was left. Not showing any call notes. Thank You

## 2023-10-23 NOTE — Telephone Encounter (Signed)
 Left v/m trying to reach pts wife Fernando Stoiber.please see note in Yanni Quiroa chart 03/12/57.

## 2023-12-12 ENCOUNTER — Ambulatory Visit (INDEPENDENT_AMBULATORY_CARE_PROVIDER_SITE_OTHER)

## 2023-12-12 ENCOUNTER — Telehealth: Payer: Self-pay

## 2023-12-12 VITALS — BP 124/60 | Ht 67.0 in | Wt 228.0 lb

## 2023-12-12 DIAGNOSIS — Z0000 Encounter for general adult medical examination without abnormal findings: Secondary | ICD-10-CM

## 2023-12-12 DIAGNOSIS — E1159 Type 2 diabetes mellitus with other circulatory complications: Secondary | ICD-10-CM

## 2023-12-12 NOTE — Progress Notes (Signed)
 Because this visit was a virtual/telehealth visit,  certain criteria was not obtained, such a blood pressure, CBG if applicable, and timed get up and go. Any medications not marked as "taking" were not mentioned during the medication reconciliation part of the visit. Any vitals not documented were not able to be obtained due to this being a telehealth visit or patient was unable to self-report a recent blood pressure reading due to a lack of equipment at home via telehealth. Vitals that have been documented are verbally provided by the patient.   This visit was performed by a medical professional under my direct supervision. I was immediately available for consultation/collaboration. I have reviewed and agree with the Annual Wellness Visit documentation.  Subjective:   Anthony Hunt is a 73 y.o. who presents for a Medicare Wellness preventive visit.  As a reminder, Annual Wellness Visits don't include a physical exam, and some assessments may be limited, especially if this visit is performed virtually. We may recommend an in-person follow-up visit with your provider if needed.  Visit Complete: Virtual I connected with  Anthony Hunt on 12/12/23 by a audio enabled telemedicine application and verified that I am speaking with the correct person using two identifiers.  Patient Location: Home  Provider Location: Home Office  I discussed the limitations of evaluation and management by telemedicine. The patient expressed understanding and agreed to proceed.  Vital Signs: Because this visit was a virtual/telehealth visit, some criteria may be missing or patient reported. Any vitals not documented were not able to be obtained and vitals that have been documented are patient reported.  VideoDeclined- This patient declined Librarian, academic. Therefore the visit was completed with audio only.  Persons Participating in Visit: Patient.  AWV Questionnaire: Yes: Patient  Medicare AWV questionnaire was completed by the patient on 12/05/2023; I have confirmed that all information answered by patient is correct and no changes since this date.  Cardiac Risk Factors include: advanced age (>43men, >24 women);hypertension;male gender;diabetes mellitus;obesity (BMI >30kg/m2)     Objective:     Today's Vitals   12/12/23 1559  BP: 124/60  Weight: 228 lb (103.4 kg)  Height: 5\' 7"  (1.702 m)   Body mass index is 35.71 kg/m.     12/12/2023    4:02 PM 06/27/2023    9:00 PM 06/27/2023    9:57 AM 06/12/2020   10:57 AM 12/01/2013    8:29 PM 10/07/2013    3:21 PM  Advanced Directives  Does Patient Have a Medical Advance Directive? No No No No Patient would not like information Patient does not have advance directive  Would patient like information on creating a medical advance directive? No - Patient declined No - Patient declined      Pre-existing out of facility DNR order (yellow form or pink MOST form)      No    Current Medications (verified) Outpatient Encounter Medications as of 12/12/2023  Medication Sig   empagliflozin  (JARDIANCE ) 10 MG TABS tablet Take 1 tablet (10 mg total) by mouth daily before breakfast.   glipiZIDE  (GLUCOTROL  XL) 5 MG 24 hr tablet TAKE 1 TABLET EVERY DAY WITH BREAKFAST   glucose blood test strip Use to check blood sugar once a day. Dx Code E11.59   metFORMIN  (GLUCOPHAGE -XR) 500 MG 24 hr tablet TAKE 2 TABLETS EVERY DAY WITH BREAKFAST   metoprolol  succinate (TOPROL -XL) 25 MG 24 hr tablet Take 1 tablet (25 mg total) by mouth daily.   OneTouch Delica Lancets  30G MISC 1 each by Does not apply route daily. Use to check blood sugar daily. Dx Code E11.59   pantoprazole  (PROTONIX ) 40 MG tablet Take 1 tablet (40 mg total) by mouth daily.   simvastatin  (ZOCOR ) 80 MG tablet Take 1 tablet (80 mg total) by mouth daily at 6 PM.   No facility-administered encounter medications on file as of 12/12/2023.    Allergies (verified) Patient has no known  allergies.   History: Past Medical History:  Diagnosis Date   Allergy    Bright's disease    age 4-6  no issues since    CAD (coronary artery disease) 2005   MI   Diabetes mellitus without complication (HCC)    Hyperlipidemia    Hypertension    Myocardial infarction (HCC) 2005   Pre-diabetes    Sleep apnea    Uses Cpap   Past Surgical History:  Procedure Laterality Date   BIOPSY  06/28/2023   Procedure: BIOPSY;  Surgeon: Albertina Hugger, MD;  Location: [facility];  Service: Gastroenterology;;   CERVICAL DISC SURGERY     CORONARY STENT PLACEMENT  2005   ESOPHAGOGASTRODUODENOSCOPY (EGD) WITH PROPOFOL  N/A 06/28/2023   Procedure: ESOPHAGOGASTRODUODENOSCOPY (EGD) WITH PROPOFOL ;  Surgeon: Albertina Hugger, MD;  Location: [facility]OS[facility];  Service: Gastroenterology;  Laterality: N/A;   ULNAR NERVE REPAIR     Left ulnar nerve decompression 02/07   US  ECHOCARDIOGRAPHY      mild inf hypokinesis, EF 55%  09/05   Family History  Problem Relation Age of Onset   Coronary artery disease Mother    Peripheral vascular disease Mother    Cancer Maternal Uncle    Cancer Paternal Aunt    Diabetes Neg Hx    Hypertension Neg Hx    Colon cancer Neg Hx    Colon polyps Neg Hx    Esophageal cancer Neg Hx    Rectal cancer Neg Hx    Stomach cancer Neg Hx    Bladder Cancer Neg Hx    Prostate cancer Neg Hx    Kidney cancer Neg Hx    Social History   Socioeconomic History   Marital status: Married    Spouse name: Not on file   Number of children: 1   Years of education: Not on file   Highest education level: 12th grade  Occupational History   Occupation: [employer]    Comment: Retired 10/22   Occupation: Invoice work/porter    Comment: Magazine features editor  Tobacco Use   Smoking status: Never    Passive exposure: Past   Smokeless tobacco: Current    Types: Chew   Tobacco comments:    Occasionally, couple times a week (on the weekends).  12/24/2021 hfb  Vaping Use   Vaping  status: Never Used  Substance and Sexual Activity   Alcohol use: No    Alcohol/week: 0.0 standard drinks of alcohol   Drug use: No   Sexual activity: Not on file  Other Topics Concern   Not on file  Social History Narrative   No living will   Wife would be health care POA_--alternate is son   Would accept resuscitation attempts   Not sure about tube feeds   Social Drivers of Health   Financial Resource Strain: Low Risk  (12/12/2023)   Overall Financial Resource Strain (CARDIA)    Difficulty of Paying Living Expenses: Not hard at all  Food Insecurity: No Food Insecurity (12/12/2023)   Hunger Vital Sign  Worried About Programme researcher, broadcasting/film/video in the Last Year: Never true    Ran Out of Food in the Last Year: Never true  Transportation Needs: No Transportation Needs (12/12/2023)   PRAPARE - Administrator, Civil Service (Medical): No    Lack of Transportation (Non-Medical): No  Physical Activity: Sufficiently Active (12/12/2023)   Exercise Vital Sign    Days of Exercise per Week: 7 days    Minutes of Exercise per Session: 60 min  Stress: No Stress Concern Present (12/12/2023)   [employer] of Occupational Health - Occupational Stress Questionnaire    Feeling of Stress : Not at all  Social Connections: Socially Integrated (12/12/2023)   Social Connection and Isolation Panel [NHANES]    Frequency of Communication with Friends and Family: Three times a week    Frequency of Social Gatherings with Friends and Family: Twice a week    Attends Religious Services: More than 4 times per year    Active Member of [employer] or Organizations: Yes    Attends Engineer, structural: More than 4 times per year    Marital Status: Married    Tobacco Counseling Ready to quit: Not Answered Counseling given: Not Answered Tobacco comments: Occasionally, couple times a week (on the weekends).  12/24/2021 hfb    Clinical Intake:  Pre-visit preparation completed: Yes  Pain :  No/denies pain     BMI - recorded: 35.71 Nutritional Status: BMI > 30  Obese Nutritional Risks: None Diabetes: Yes CBG done?: No Did pt. bring in CBG monitor from home?: No  Lab Results  Component Value Date   HGBA1C 6.0 (H) 06/27/2023   HGBA1C 6.6 (A) 04/11/2023   HGBA1C 9.9 (H) 12/28/2022     How often do you need to have someone help you when you read instructions, pamphlets, or other written materials from your doctor or pharmacy?: 1 - Never What is the last grade level you completed in school?: 12th grade  Interpreter Needed?: No  Information entered by :: Juliann Ochoa   Activities of Daily Living     12/05/2023   11:47 AM 06/27/2023    9:00 PM  In your present state of health, do you have any difficulty performing the following activities:  Hearing? 0 0  Vision? 0 0  Difficulty concentrating or making decisions? 0 0  Walking or climbing stairs? 0   Dressing or bathing? 0   Doing errands, shopping? 0 0  Preparing Food and eating ? N   Using the Toilet? N   In the past six months, have you accidently leaked urine? N   Do you have problems with loss of bowel control? N   Managing your Medications? N   Managing your Finances? N   Housekeeping or managing your Housekeeping? N     Patient Care Team: Helaine Llanos, MD as PCP - General  Indicate any recent Medical Services you may have received from other than [facility] providers in the past year (date may be approximate).     Assessment:    This is a routine wellness examination for Anthony Hunt.  Hearing/Vision screen Vision Screening - Comments:: No vision difficulties    Goals Addressed             This Visit's Progress    Patient Stated       Patient he would like to still be working        Depression Screen     12/12/2023  4:04 PM 12/28/2022    7:52 AM 12/28/2022    7:30 AM 12/17/2021   10:48 AM 12/02/2020    8:16 AM 11/22/2019    8:26 AM 11/19/2018   10:42 AM  PHQ 2/9 Scores  PHQ - 2  Score 0 0 0 0 0 0 0  PHQ- 9 Score 0          Fall Risk     12/05/2023   11:47 AM 12/28/2022    7:51 AM 12/28/2022    7:30 AM 12/17/2021   10:48 AM  Fall Risk   Falls in the past year? 0 0 0 0  Number falls in past yr: 0  0   Injury with Fall? 0  0   Risk for fall due to : History of fall(s)  No Fall Risks   Follow up Falls evaluation completed  Falls evaluation completed     MEDICARE RISK AT HOME:  Medicare Risk at Home Any stairs in or around the home?: (Patient-Rptd) No If so, are there any without handrails?: (Patient-Rptd) No Home free of loose throw rugs in walkways, pet beds, electrical cords, etc?: (Patient-Rptd) Yes Adequate lighting in your home to reduce risk of falls?: (Patient-Rptd) Yes Life alert?: (Patient-Rptd) No Use of a cane, walker or w/c?: (Patient-Rptd) No Grab bars in the bathroom?: (Patient-Rptd) No Shower chair or bench in shower?: (Patient-Rptd) No Elevated toilet seat or a handicapped toilet?: (Patient-Rptd) No  TIMED UP AND GO:  Was the test performed?  No  Cognitive Function: 6CIT completed        12/12/2023    4:01 PM  6CIT Screen  What Year? 0 points  What month? 0 points  What time? 0 points  Count back from 20 0 points  Months in reverse 0 points  Repeat phrase 0 points  Total Score 0 points    Immunizations Immunization History  Administered Date(s) Administered   Fluad Quad(high Dose 65+) 03/21/2019, 06/04/2021   Fluad Trivalent(High Dose 65+) 04/11/2023   Influenza Split 05/19/2011, 08/16/2012   Influenza, High Dose Seasonal PF 06/25/2018   Influenza,inj,Quad PF,6+ Mos 08/16/2013, 08/19/2014, 08/25/2015, 08/30/2016, 09/01/2017, 06/02/2020   PFIZER(Purple Top)SARS-COV-2 Vaccination 08/29/2019, 09/19/2019, 07/02/2020   Pneumococcal Conjugate-13 11/22/2019   Pneumococcal Polysaccharide-23 08/20/2009, 12/02/2020   Td 04/14/2004   Tdap 08/19/2014   Zoster Recombinant(Shingrix) 11/22/2019, 06/02/2020   Zoster, Live 09/15/2013     Screening Tests Health Maintenance  Topic Date Due   Hepatitis C Screening  Never done   OPHTHALMOLOGY EXAM  02/17/2023   Diabetic kidney evaluation - Urine ACR  12/28/2023   HEMOGLOBIN A1C  12/26/2023   FOOT EXAM  12/28/2023   INFLUENZA VACCINE  02/16/2024   Diabetic kidney evaluation - eGFR measurement  07/03/2024   DTaP/Tdap/Td (3 - Td or Tdap) 08/19/2024   Medicare Annual Wellness (AWV)  12/11/2024   Colonoscopy  04/16/2028   Pneumonia Vaccine 62+ Years old  Completed   Zoster Vaccines- Shingrix  Completed   HPV VACCINES  Aged Out   Meningococcal B Vaccine  Aged Out   COVID-19 Vaccine  Discontinued    Health Maintenance  Health Maintenance Due  Topic Date Due   Hepatitis C Screening  Never done   OPHTHALMOLOGY EXAM  02/17/2023   Diabetic kidney evaluation - Urine ACR  12/28/2023   Health Maintenance Items Addressed: UACR (Urine Albumin:Creatinine Ratio) patient declined eye exam and hep c screening  Additional Screening:  Vision Screening: Recommended annual ophthalmology exams for early detection of glaucoma and  other disorders of the eye.  Dental Screening: Recommended annual dental exams for proper oral hygiene  Community Resource Referral / Chronic Care Management: CRR required this visit?  No   CCM required this visit?  No   Plan:    I have personally reviewed and noted the following in the patient's chart:   Medical and social history Use of alcohol, tobacco or illicit drugs  Current medications and supplements including opioid prescriptions. Patient is not currently taking opioid prescriptions. Functional ability and status Nutritional status Physical activity Advanced directives List of other physicians Hospitalizations, surgeries, and ER visits in previous 12 months Vitals Screenings to include cognitive, depression, and falls Referrals and appointments  In addition, I have reviewed and discussed with patient certain preventive  protocols, quality metrics, and best practice recommendations. A written personalized care plan for preventive services as well as general preventive health recommendations were provided to patient.   Anthony Hunt, [place]   12/12/2023   After Visit Summary: (MyChart) Due to this being a telephonic visit, the after visit summary with patients personalized plan was offered to patient via MyChart   Notes: Nothing significant to report at this time.

## 2023-12-12 NOTE — Patient Instructions (Signed)
 Mr. Anthony Hunt , Thank you for taking time out of your busy schedule to complete your Annual Wellness Visit with me. I enjoyed our conversation and look forward to speaking with you again next year. I, as well as your care team,  appreciate your ongoing commitment to your health goals. Please review the following plan we discussed and let me know if I can assist you in the future. Your Game plan/ To Do List    Referrals: If you haven't heard from the office you've been referred to, please reach out to them at the phone provided.   Follow up Visits: Next Medicare AWV with our clinical staff: 12/12/2024   Have you seen your provider in the last 6 months (3 months if uncontrolled diabetes)? Yes Next Office Visit with your provider: 01/04/2024  Clinician Recommendations:  Aim for 30 minutes of exercise or brisk walking, 6-8 glasses of water, and 5 servings of fruits and vegetables each day.       This is a list of the screening recommended for you and due dates:  Health Maintenance  Topic Date Due   Hepatitis C Screening  Never done   Eye exam for diabetics  02/17/2023   Yearly kidney health urinalysis for diabetes  12/28/2023   Hemoglobin A1C  12/26/2023   Complete foot exam   12/28/2023   Flu Shot  02/16/2024   Yearly kidney function blood test for diabetes  07/03/2024   DTaP/Tdap/Td vaccine (3 - Td or Tdap) 08/19/2024   Medicare Annual Wellness Visit  12/11/2024   Colon Cancer Screening  04/16/2028   Pneumonia Vaccine  Completed   Zoster (Shingles) Vaccine  Completed   HPV Vaccine  Aged Out   Meningitis B Vaccine  Aged Out   COVID-19 Vaccine  Discontinued    Advanced directives: (Declined) Advance directive discussed with you today. Even though you declined this today, please call our office should you change your mind, and we can give you the proper paperwork for you to fill out. Advance Care Planning is important because it:  [x]  Makes sure you receive the medical care that is  consistent with your values, goals, and preferences  [x]  It provides guidance to your family and loved ones and reduces their decisional burden about whether or not they are making the right decisions based on your wishes.  Follow the link provided in your after visit summary or read over the paperwork we have mailed to you to help you started getting your Advance Directives in place. If you need assistance in completing these, please reach out to us  so that we can help you!  See attachments for Preventive Care and Fall Prevention Tips.

## 2023-12-12 NOTE — Telephone Encounter (Signed)
 Copied from CRM [id number]. Topic: Appointments - Other >> Dec 12, 2023  2:03 PM [place] wrote: Reason for CRM: Patient had a Wellness phone call scheduled for 1:40pm today. He called around 1:40 and I let him know that a nurse will be calling him closer to his appointment time. He called back at 1:55pm and stated that no one has called him yet. I put him on hold to call the office and Betty Bruckner said she sent the nurse a message letting them know that he was waiting on the call. Went back to the Patient and let him know that she was running behind and she still was not on the line with Anthony Hunt. He is at work and set him aside for this phone call and he said he will try to answer if the nurse calls, but if he has to go back to work he will not be able to talk.

## 2024-01-04 ENCOUNTER — Encounter: Payer: Medicare HMO | Admitting: Internal Medicine

## 2024-01-09 ENCOUNTER — Other Ambulatory Visit: Payer: Self-pay | Admitting: Internal Medicine

## 2024-01-18 ENCOUNTER — Encounter: Payer: Self-pay | Admitting: Internal Medicine

## 2024-01-18 ENCOUNTER — Ambulatory Visit: Admitting: Internal Medicine

## 2024-01-18 ENCOUNTER — Ambulatory Visit: Payer: Self-pay | Admitting: Internal Medicine

## 2024-01-18 VITALS — BP 126/66 | HR 51 | Temp 98.1°F | Resp 20 | Ht 68.0 in | Wt 225.5 lb

## 2024-01-18 DIAGNOSIS — Z7984 Long term (current) use of oral hypoglycemic drugs: Secondary | ICD-10-CM | POA: Diagnosis not present

## 2024-01-18 DIAGNOSIS — Z0000 Encounter for general adult medical examination without abnormal findings: Secondary | ICD-10-CM

## 2024-01-18 DIAGNOSIS — R972 Elevated prostate specific antigen [PSA]: Secondary | ICD-10-CM

## 2024-01-18 DIAGNOSIS — D696 Thrombocytopenia, unspecified: Secondary | ICD-10-CM | POA: Diagnosis not present

## 2024-01-18 DIAGNOSIS — Z125 Encounter for screening for malignant neoplasm of prostate: Secondary | ICD-10-CM | POA: Diagnosis not present

## 2024-01-18 DIAGNOSIS — E1159 Type 2 diabetes mellitus with other circulatory complications: Secondary | ICD-10-CM

## 2024-01-18 DIAGNOSIS — I2510 Atherosclerotic heart disease of native coronary artery without angina pectoris: Secondary | ICD-10-CM

## 2024-01-18 DIAGNOSIS — G4733 Obstructive sleep apnea (adult) (pediatric): Secondary | ICD-10-CM

## 2024-01-18 DIAGNOSIS — E119 Type 2 diabetes mellitus without complications: Secondary | ICD-10-CM | POA: Insufficient documentation

## 2024-01-18 LAB — RENAL FUNCTION PANEL
Albumin: 4.3 g/dL (ref 3.5–5.2)
BUN: 20 mg/dL (ref 6–23)
CO2: 29 meq/L (ref 19–32)
Calcium: 9.4 mg/dL (ref 8.4–10.5)
Chloride: 106 meq/L (ref 96–112)
Creatinine, Ser: 1.37 mg/dL (ref 0.40–1.50)
GFR: 52.12 mL/min — ABNORMAL LOW (ref >60.00)
Glucose, Bld: 154 mg/dL — ABNORMAL HIGH (ref 70–99)
Phosphorus: 3 mg/dL (ref 2.3–4.6)
Potassium: 4.7 meq/L (ref 3.5–5.1)
Sodium: 141 meq/L (ref 135–145)

## 2024-01-18 LAB — LIPID PANEL
Cholesterol: 138 mg/dL (ref 0–200)
HDL: 32.3 mg/dL — ABNORMAL LOW (ref >39.00)
LDL Cholesterol: 80 mg/dL (ref 0–99)
NonHDL: 105.74
Total CHOL/HDL Ratio: 4
Triglycerides: 128 mg/dL (ref 0.0–149.0)
VLDL: 25.6 mg/dL (ref 0.0–40.0)

## 2024-01-18 LAB — CBC
HCT: 48.1 % (ref 39.0–52.0)
Hemoglobin: 16.2 g/dL (ref 13.0–17.0)
MCHC: 33.7 g/dL (ref 30.0–36.0)
MCV: 98.2 fl (ref 78.0–100.0)
Platelets: 123 10*3/uL — ABNORMAL LOW (ref 150.0–400.0)
RBC: 4.9 Mil/uL (ref 4.22–5.81)
RDW: 14.1 % (ref 11.5–15.5)
WBC: 5.2 10*3/uL (ref 4.0–10.5)

## 2024-01-18 LAB — HEMOGLOBIN A1C: Hgb A1c MFr Bld: 6.6 % — ABNORMAL HIGH (ref 4.6–6.5)

## 2024-01-18 LAB — HEPATIC FUNCTION PANEL
ALT: 38 U/L (ref 0–53)
AST: 33 U/L (ref 0–37)
Albumin: 4.3 g/dL (ref 3.5–5.2)
Alkaline Phosphatase: 45 U/L (ref 39–117)
Bilirubin, Direct: 0.2 mg/dL (ref 0.0–0.3)
Total Bilirubin: 0.9 mg/dL (ref 0.2–1.2)
Total Protein: 6.9 g/dL (ref 6.0–8.3)

## 2024-01-18 LAB — MICROALBUMIN / CREATININE URINE RATIO
Creatinine,U: 103.3 mg/dL
Microalb Creat Ratio: 9.3 mg/g (ref 0.0–30.0)
Microalb, Ur: 1 mg/dL (ref 0.0–1.9)

## 2024-01-18 LAB — HM DIABETES FOOT EXAM

## 2024-01-18 NOTE — Assessment & Plan Note (Signed)
 No angina Needs to increase exercise On metoprolol  25mg  daily and simvastatin  80

## 2024-01-18 NOTE — Assessment & Plan Note (Signed)
Does well with CPAP nightly

## 2024-01-18 NOTE — Assessment & Plan Note (Signed)
 Will check again---down to 4.8 from 9 last year

## 2024-01-18 NOTE — Progress Notes (Signed)
 Subjective:    Patient ID: Anthony Hunt, male    DOB: [birth year], 73 y.o.   MRN: [id number]  HPI Here for physical  Wondering about the CKD diagnosis GFR in 50's during hospital stay---but not before  Not checking sugars at all No hypoglycemic reactions No foot numbness or burning  Current Outpatient Medications on File Prior to Visit  Medication Sig Dispense Refill   empagliflozin  (JARDIANCE ) 10 MG TABS tablet Take 1 tablet (10 mg total) by mouth daily before breakfast. 90 tablet 3   glipiZIDE  (GLUCOTROL  XL) 5 MG 24 hr tablet TAKE 1 TABLET EVERY DAY WITH BREAKFAST 90 tablet 0   glucose blood test strip Use to check blood sugar once a day. Dx Code E11.59 100 each 4   metFORMIN  (GLUCOPHAGE -XR) 500 MG 24 hr tablet TAKE 2 TABLETS EVERY DAY WITH BREAKFAST 180 tablet 3   metoprolol  succinate (TOPROL -XL) 25 MG 24 hr tablet Take 1 tablet (25 mg total) by mouth daily. 90 tablet 3   OneTouch Delica Lancets 30G MISC 1 each by Does not apply route daily. Use to check blood sugar daily. Dx Code E11.59 100 each 4   pantoprazole  (PROTONIX ) 40 MG tablet Take 1 tablet (40 mg total) by mouth daily. 90 tablet 3   simvastatin  (ZOCOR ) 80 MG tablet Take 1 tablet (80 mg total) by mouth daily at 6 PM. 90 tablet 3   No current facility-administered medications on file prior to visit.    No Known Allergies  Past Medical History:  Diagnosis Date   Allergy    Bright's disease    age [age]  no issues since    CAD (coronary artery disease) 2005   MI   Diabetes mellitus without complication (HCC)    Hyperlipidemia    Hypertension    Myocardial infarction (HCC) 2005   Pre-diabetes    Sleep apnea    Uses Cpap   Ulcer     Past Surgical History:  Procedure Laterality Date   BIOPSY  06/28/2023   Procedure: BIOPSY;  Surgeon: Legrand Victory LITTIE DOUGLAS, MD;  Location: [facility];  Service: Gastroenterology;;   CERVICAL DISC SURGERY     CORONARY STENT PLACEMENT  2005   ESOPHAGOGASTRODUODENOSCOPY (EGD)  WITH PROPOFOL  N/A 06/28/2023   Procedure: ESOPHAGOGASTRODUODENOSCOPY (EGD) WITH PROPOFOL ;  Surgeon: Legrand Victory LITTIE DOUGLAS, MD;  Location: [facility];  Service: Gastroenterology;  Laterality: N/A;   SPINE SURGERY     ULNAR NERVE REPAIR     Left ulnar nerve decompression 02/07   US  ECHOCARDIOGRAPHY      mild inf hypokinesis, EF 55%  09/05    Family History  Problem Relation Age of Onset   Coronary artery disease Mother    Peripheral vascular disease Mother    Cancer Maternal Uncle    Cancer Paternal Aunt    Diabetes Neg Hx    Hypertension Neg Hx    Colon cancer Neg Hx    Colon polyps Neg Hx    Esophageal cancer Neg Hx    Rectal cancer Neg Hx    Stomach cancer Neg Hx    Bladder Cancer Neg Hx    Prostate cancer Neg Hx    Kidney cancer Neg Hx     Social History   Socioeconomic History   Marital status: Married    Spouse name: Not on file   Number of children: 1   Years of education: Not on file   Highest education level: 12th grade  Occupational History  Occupation: Programmer, systems    Comment: Retired 10/22   Occupation: Invoice work/porter    Comment: Magazine features editor  Tobacco Use   Smoking status: Never    Passive exposure: Past   Smokeless tobacco: Former    Types: Chew   Tobacco comments:    Occasionally, couple times a week (on the weekends).  12/24/2021 hfb  Vaping Use   Vaping status: Never Used  Substance and Sexual Activity   Alcohol use: No   Drug use: No   Sexual activity: Yes    Birth control/protection: None  Other Topics Concern   Not on file  Social History Narrative   No living will   Wife would be health care POA_--alternate is son   Would accept resuscitation attempts   Not sure about tube feeds   Social Drivers of Health   Financial Resource Strain: Low Risk  (01/16/2024)   Overall Financial Resource Strain (CARDIA)    Difficulty of Paying Living Expenses: Not hard at all  Food Insecurity: No Food Insecurity (01/16/2024)   Hunger Vital  Sign    Worried About Running Out of Food in the Last Year: Never true    Ran Out of Food in the Last Year: Never true  Transportation Needs: No Transportation Needs (01/16/2024)   PRAPARE - Administrator, Civil Service (Medical): No    Lack of Transportation (Non-Medical): No  Physical Activity: Insufficiently Active (01/16/2024)   Exercise Vital Sign    Days of Exercise per Week: 2 days    Minutes of Exercise per Session: 20 min  Stress: No Stress Concern Present (01/16/2024)   [employer] of Occupational Health - Occupational Stress Questionnaire    Feeling of Stress: Not at all  Social Connections: Socially Integrated (01/16/2024)   Social Connection and Isolation Panel    Frequency of Communication with Friends and Family: Once a week    Frequency of Social Gatherings with Friends and Family: Twice a week    Attends Religious Services: More than 4 times per year    Active Member of [employer] or Organizations: Yes    Attends Engineer, structural: More than 4 times per year    Marital Status: Married  Catering manager Violence: Not At Risk (12/12/2023)   Humiliation, Afraid, Rape, and Kick questionnaire    Fear of Current or Ex-Partner: No    Emotionally Abused: No    Physically Abused: No    Sexually Abused: No   Review of Systems  Constitutional:  Negative for fatigue and unexpected weight change.       Wears seat belt  HENT:  Negative for dental problem, hearing loss and tinnitus.        Keeps up with dentist  Eyes:  Negative for visual disturbance.       No diplopia or unilateral vision loss  Respiratory:  Negative for cough, chest tightness and shortness of breath.   Cardiovascular:  Negative for chest pain, palpitations and leg swelling.  Gastrointestinal:  Negative for blood in stool and constipation.       No heartburn --protonix  daily now  Endocrine: Negative for polydipsia and polyuria.  Genitourinary:  Negative for difficulty urinating and urgency.        No sexual problems  Musculoskeletal:  Negative for arthralgias, back pain and joint swelling.  Skin:  Negative for rash.  Allergic/Immunologic: Positive for environmental allergies. Negative for immunocompromised state.       Spring symptoms--uses loratadine prn  Neurological:  Negative for dizziness, syncope, light-headedness and headaches.  Hematological:  Negative for adenopathy. Does not bruise/bleed easily.  Psychiatric/Behavioral:  Negative for dysphoric mood and sleep disturbance. The patient is not nervous/anxious.        Uses CPAP every night       Objective:   Physical Exam Constitutional:      Appearance: Normal appearance.  HENT:     Mouth/Throat:     Pharynx: No oropharyngeal exudate or posterior oropharyngeal erythema.  Eyes:     Conjunctiva/sclera: Conjunctivae normal.     Pupils: Pupils are equal, round, and reactive to light.  Cardiovascular:     Rate and Rhythm: Normal rate and regular rhythm.     Pulses: Normal pulses.     Heart sounds: No murmur heard.    No gallop.  Pulmonary:     Effort: Pulmonary effort is normal.     Breath sounds: Normal breath sounds. No wheezing or rales.  Abdominal:     Palpations: Abdomen is soft.     Tenderness: There is no abdominal tenderness.  Musculoskeletal:     Cervical back: Neck supple.     Right lower leg: No edema.     Left lower leg: No edema.  Lymphadenopathy:     Cervical: No cervical adenopathy.  Skin:    Findings: No lesion or rash.     Comments: No foot lesions  Neurological:     General: No focal deficit present.     Mental Status: He is alert and oriented to person, place, and time.     Comments: Normal sensation in feet  Psychiatric:        Mood and Affect: Mood normal.        Behavior: Behavior normal.            Assessment & Plan:

## 2024-01-18 NOTE — Assessment & Plan Note (Signed)
 Hasn't been checking---urged to check at least occasionally Yearly eye exam Jardiance  10, glipizide  5 and metformin  1000 daily

## 2024-01-18 NOTE — Assessment & Plan Note (Signed)
 Fairly healthy but needs to work on fitness and weight loss Flu vaccine in the fall---he isn't excited about COVID Colon due again 2029 Will check PSA--?last one

## 2024-01-18 NOTE — Assessment & Plan Note (Signed)
 Mild and no abnormal bleeding Stable over years No action needed

## 2024-01-22 ENCOUNTER — Ambulatory Visit: Payer: Self-pay | Admitting: *Deleted

## 2024-01-22 ENCOUNTER — Encounter: Payer: Self-pay | Admitting: Internal Medicine

## 2024-01-22 ENCOUNTER — Ambulatory Visit (INDEPENDENT_AMBULATORY_CARE_PROVIDER_SITE_OTHER): Admitting: Internal Medicine

## 2024-01-22 VITALS — BP 128/62 | HR 55 | Temp 98.0°F | Ht 68.0 in | Wt 227.0 lb

## 2024-01-22 DIAGNOSIS — S46812A Strain of other muscles, fascia and tendons at shoulder and upper arm level, left arm, initial encounter: Secondary | ICD-10-CM | POA: Diagnosis not present

## 2024-01-22 LAB — PSA, TOTAL AND FREE
PSA, Free: 2.7 ng/mL
PSA, Total: 11.3 ng/mL — ABNORMAL HIGH (ref <=4.0)

## 2024-01-22 MED ORDER — TIZANIDINE HCL 2 MG PO TABS: 2.0000 mg | ORAL_TABLET | Freq: Two times a day (BID) | ORAL | 0 refills | Status: DC | PRN

## 2024-01-22 NOTE — Telephone Encounter (Signed)
         FYI Only or Action Required?: Action required by provider: request for appointment.  Patient was last seen in primary care on 01/18/2024 by Anthony Charlie FERNS, MD. Called Nurse Triage reporting Neck Pain. Symptoms began several days ago. Interventions attempted: OTC medications: tylenol   and Rest, hydration, or home remedies ice/ heat compresses not working . Symptoms are: gradually worsening.  Triage Disposition: See HCP Within 4 Hours (Or PCP Triage)  Patient/caregiver understands and will follow disposition?: Yes                       Copied from CRM [id number]. Topic: Clinical - Red Word Triage >> Jan 22, 2024  8:37 AM [place] G wrote: [facility] that prompted transfer to Nurse Triage: pain that initially felt like the crick in the neck.. now radiating down shoulder.. started Friday and now worsening - not sleeping Reason for Disposition  [1] SEVERE neck pain (e.g., excruciating, unable to do any normal activities) AND [2] not improved after 2 hours of pain medicine  Answer Assessment - Initial Assessment Questions 1. ONSET: When did the pain begin?      Friday morning  2. LOCATION: Where does it hurt?      Left neck down shoulder 3. PATTERN Does the pain come and go, or has it been constant since it started?      Comes and goes now more constant 4. SEVERITY: How bad is the pain?  (Scale 1-10; or mild, moderate, severe)   - NO PAIN (0): no pain or only slight stiffness    - MILD (1-3): doesn't interfere with normal activities    - MODERATE (4-7): interferes with normal activities or awakens from sleep    - SEVERE (8-10):  excruciating pain, unable to do any normal activities      Not sleeping , 5-6/10 can be severe at times.  5. RADIATION: Does the pain go anywhere else, shoot into your arms?     Left shoulder  6. CORD SYMPTOMS: Any weakness or numbness of the arms or legs?     no 7. CAUSE: What do you think is causing the neck pain?      Not sure woke up with pain  8. NECK OVERUSE: Any recent activities that involved turning or twisting the neck?     Na  9. OTHER SYMPTOMS: Do you have any other symptoms? (e.g., headache, fever, chest pain, difficulty breathing, neck swelling)     Left neck pain  10. PREGNANCY: Is there any chance you are pregnant? When was your last menstrual period?       Na   Appt scheduled today .  Protocols used: Neck Pain or Stiffness-A-AH

## 2024-01-22 NOTE — Telephone Encounter (Signed)
 Fyi for appt this afternoon

## 2024-01-22 NOTE — Progress Notes (Signed)
 Subjective:    Patient ID: Anthony Hunt, male    DOB: [birth year], 73 y.o.   MRN: [id number]  HPI Here due to neck pain--especially on the left  Woke with neck pain 3 days ago No injury and no new tasks Felt like a crick and worsened as the day went on Trouble sleeping the next night--just couldn't get comfortable Gets bursts of more severe pain for some seconds  Tried tylenol  1000mg  twice a day--does help some Slept better last night--but still painful  Pain goes towards the left shoulder--but not the arm No arm weakness  Tried heat and ice--neither helped  Current Outpatient Medications on File Prior to Visit  Medication Sig Dispense Refill   empagliflozin  (JARDIANCE ) 10 MG TABS tablet Take 1 tablet (10 mg total) by mouth daily before breakfast. 90 tablet 3   glipiZIDE  (GLUCOTROL  XL) 5 MG 24 hr tablet TAKE 1 TABLET EVERY DAY WITH BREAKFAST 90 tablet 0   glucose blood test strip Use to check blood sugar once a day. Dx Code E11.59 100 each 4   metFORMIN  (GLUCOPHAGE -XR) 500 MG 24 hr tablet TAKE 2 TABLETS EVERY DAY WITH BREAKFAST 180 tablet 3   metoprolol  succinate (TOPROL -XL) 25 MG 24 hr tablet Take 1 tablet (25 mg total) by mouth daily. 90 tablet 3   OneTouch Delica Lancets 30G MISC 1 each by Does not apply route daily. Use to check blood sugar daily. Dx Code E11.59 100 each 4   pantoprazole  (PROTONIX ) 40 MG tablet Take 1 tablet (40 mg total) by mouth daily. 90 tablet 3   simvastatin  (ZOCOR ) 80 MG tablet Take 1 tablet (80 mg total) by mouth daily at 6 PM. 90 tablet 3   No current facility-administered medications on file prior to visit.    No Known Allergies  Past Medical History:  Diagnosis Date   Allergy    Bright's disease    age 37-6  no issues since    CAD (coronary artery disease) 2005   MI   Diabetes mellitus without complication (HCC)    Hyperlipidemia    Hypertension    Myocardial infarction (HCC) 2005   Pre-diabetes    Sleep apnea    Uses Cpap    Ulcer     Past Surgical History:  Procedure Laterality Date   BIOPSY  06/28/2023   Procedure: BIOPSY;  Surgeon: Legrand Victory LITTIE DOUGLAS, MD;  Location: [facility];  Service: Gastroenterology;;   CERVICAL DISC SURGERY     CORONARY STENT PLACEMENT  2005   ESOPHAGOGASTRODUODENOSCOPY (EGD) WITH PROPOFOL  N/A 06/28/2023   Procedure: ESOPHAGOGASTRODUODENOSCOPY (EGD) WITH PROPOFOL ;  Surgeon: Legrand Victory LITTIE DOUGLAS, MD;  Location: [facility];  Service: Gastroenterology;  Laterality: N/A;   SPINE SURGERY     ULNAR NERVE REPAIR     Left ulnar nerve decompression 02/07   US  ECHOCARDIOGRAPHY      mild inf hypokinesis, EF 55%  09/05    Family History  Problem Relation Age of Onset   Coronary artery disease Mother    Peripheral vascular disease Mother    Cancer Maternal Uncle    Cancer Paternal Aunt    Diabetes Neg Hx    Hypertension Neg Hx    Colon cancer Neg Hx    Colon polyps Neg Hx    Esophageal cancer Neg Hx    Rectal cancer Neg Hx    Stomach cancer Neg Hx    Bladder Cancer Neg Hx    Prostate cancer Neg Hx  Kidney cancer Neg Hx     Social History   Socioeconomic History   Marital status: Married    Spouse name: Not on file   Number of children: 1   Years of education: Not on file   Highest education level: 12th grade  Occupational History   Occupation: [employer]    Comment: Retired 10/22   Occupation: Invoice work/porter    Comment: Magazine features editor  Tobacco Use   Smoking status: Never    Passive exposure: Past   Smokeless tobacco: Former    Types: Chew   Tobacco comments:    Occasionally, couple times a week (on the weekends).  12/24/2021 hfb  Vaping Use   Vaping status: Never Used  Substance and Sexual Activity   Alcohol use: No   Drug use: No   Sexual activity: Yes    Birth control/protection: None  Other Topics Concern   Not on file  Social History Narrative   No living will   Wife would be health care POA_--alternate is son   Would accept  resuscitation attempts   Not sure about tube feeds   Social Drivers of Health   Financial Resource Strain: Low Risk  (01/16/2024)   Overall Financial Resource Strain (CARDIA)    Difficulty of Paying Living Expenses: Not hard at all  Food Insecurity: No Food Insecurity (01/16/2024)   Hunger Vital Sign    Worried About Running Out of Food in the Last Year: Never true    Ran Out of Food in the Last Year: Never true  Transportation Needs: No Transportation Needs (01/16/2024)   PRAPARE - Administrator, Civil Service (Medical): No    Lack of Transportation (Non-Medical): No  Physical Activity: Insufficiently Active (01/16/2024)   Exercise Vital Sign    Days of Exercise per Week: 2 days    Minutes of Exercise per Session: 20 min  Stress: No Stress Concern Present (01/16/2024)   [employer] of Occupational Health - Occupational Stress Questionnaire    Feeling of Stress: Not at all  Social Connections: Socially Integrated (01/16/2024)   Social Connection and Isolation Panel    Frequency of Communication with Friends and Family: Once a week    Frequency of Social Gatherings with Friends and Family: Twice a week    Attends Religious Services: More than 4 times per year    Active Member of [employer] or Organizations: Yes    Attends Engineer, structural: More than 4 times per year    Marital Status: Married  Catering manager Violence: Not At Risk (12/12/2023)   Humiliation, Afraid, Rape, and Kick questionnaire    Fear of Current or Ex-Partner: No    Emotionally Abused: No    Physically Abused: No    Sexually Abused: No   Review of Systems No fever Not sick     Objective:   Physical Exam Neck:     Comments: Normal flexion but limited extension Pain with rotation and tilt to left Tenderness along medial portion of left trapezius Musculoskeletal:     Comments: Normal ROM in left shoulder  Neurological:     Comments: No arm weakness            Assessment & Plan:

## 2024-01-22 NOTE — Assessment & Plan Note (Signed)
 Discussed that this is just muscular Reassured--no evidence of radiculopathy Discussed heat Continue tylenol  regularly Tizanidine  2mg  bedtime prn

## 2024-01-22 NOTE — Telephone Encounter (Signed)
 Noted---I will check him at today's visit

## 2024-01-23 NOTE — Telephone Encounter (Signed)
-----   Message from Redell JAYSON Burnet sent at 01/23/2024 12:27 PM EDT ----- Regarding: Follow-up appointment Can offer next available appointment with me, PSA reflex to free prior.  Does not need to be urgent or overbooked  Redell Burnet, MD 01/23/2024 ----- Message ----- From: Jimmy Charlie FERNS, MD Sent: 01/23/2024   7:34 AM EDT To: Redell JAYSON Burnet, MD  Redell, Can you get him in given the rise in PSA ----- Message ----- From: Interface, [facility] Sent: 01/18/2024   2:49 PM EDT To: Charlie FERNS Jimmy, MD

## 2024-01-23 NOTE — Telephone Encounter (Signed)
 Called pt informed him of the information below per Dr. Francisca. Pt voiced understanding. Appts scheduled.

## 2024-02-02 ENCOUNTER — Other Ambulatory Visit

## 2024-02-02 DIAGNOSIS — R972 Elevated prostate specific antigen [PSA]: Secondary | ICD-10-CM

## 2024-02-03 LAB — PSA TOTAL (REFLEX TO FREE): Prostate Specific Ag, Serum: 8.6 ng/mL — ABNORMAL HIGH (ref 0.0–4.0)

## 2024-02-03 LAB — FPSA% REFLEX
% FREE PSA: 24.7 %
PSA, FREE: 2.12 ng/mL

## 2024-02-08 ENCOUNTER — Ambulatory Visit: Admitting: Urology

## 2024-02-08 VITALS — BP 161/75 | HR 57 | Ht 68.0 in | Wt 220.0 lb

## 2024-02-08 DIAGNOSIS — R972 Elevated prostate specific antigen [PSA]: Secondary | ICD-10-CM | POA: Diagnosis not present

## 2024-02-08 DIAGNOSIS — R399 Unspecified symptoms and signs involving the genitourinary system: Secondary | ICD-10-CM

## 2024-02-08 NOTE — Patient Instructions (Signed)
 Prostate Cancer Screening  Prostate cancer screening is testing that is done to check for the presence of prostate cancer in men. The prostate gland is a walnut-sized gland that is located below the bladder and in front of the rectum in males. The function of the prostate is to add fluid to semen during ejaculation. Prostate cancer is one of the most common types of cancer in men. Who should have prostate cancer screening? Screening recommendations vary based on age and other risk factors, as well as between the professional organizations who make the recommendations. In general, screening is recommended if: You are age 3 to 72 and have an average risk for prostate cancer. You should talk with your health care provider about your need for screening and how often screening should be done. Because most prostate cancers are slow growing and will not cause death, screening in this age group is generally reserved for men who have a 10- to 15-year life expectancy. You are younger than age 35, and you have these risk factors: Having a father, brother, or uncle who has been diagnosed with prostate cancer. The risk is higher if your family member's cancer occurred at an early age or if you have multiple family members with prostate cancer at an early age. Being a male who is [place] or is of [place] or sub-Saharan African descent. In general, screening is not recommended if: You are younger than age 78. You are between the ages of 49 and 28 and you have no risk factors. You are 76 years of age or older. At this age, the risks that screening can cause are greater than the benefits that it may provide. If you are at high risk for prostate cancer, your health care provider may recommend that you have screenings more often or that you start screening at a younger age. How is screening for prostate cancer done? The recommended prostate cancer screening test is a blood test called the prostate-specific antigen  (PSA) test. PSA is a protein that is made in the prostate. As you age, your prostate naturally produces more PSA. Abnormally high PSA levels may be caused by: Prostate cancer. An enlarged prostate that is not caused by cancer (benign prostatic hyperplasia, or BPH). This condition is very common in older men. A prostate gland infection (prostatitis) or urinary tract infection. Certain medicines such as male hormones (like testosterone) or other medicines that raise testosterone levels.  Depending on the PSA results, you may need more tests, such as: Blood and imaging tests. A procedure to remove tissue samples from your prostate gland for testing (biopsy). This is the only way to know for certain if you have prostate cancer. MRI What are the benefits of prostate cancer screening? Screening can help to identify cancer at an early stage, before symptoms start and when the cancer can be treated more easily. There is a small chance that screening may lower your risk of dying from prostate cancer. The chance is small because prostate cancer is a slow-growing cancer, and most men with prostate cancer die from a different cause. What are the risks of prostate cancer screening? The main risk of prostate cancer screening is diagnosing and treating prostate cancer that would never have caused any symptoms or problems. This is called overdiagnosisand overtreatment. PSA screening cannot tell you if your PSA is high due to cancer or a different cause. A prostate biopsy is the only procedure to diagnose prostate cancer. Even the results of a biopsy may not  tell you if your cancer needs to be treated. Slow-growing prostate cancer may not need any treatment other than monitoring, so diagnosing and treating it may cause unnecessary stress or other side effects. Questions to ask your health care provider When should I start prostate cancer screening? What is my risk for prostate cancer? How often do I need  screening? What type of screening tests do I need? How do I get my test results? What do my results mean? Do I need treatment? Where to find more information The American Cancer Society: www.cancer.org American Urological Association: www.auanet.org Contact a health care provider if: You have difficulty urinating. You have pain when you urinate or ejaculate. You have blood in your urine or semen. You have pain in your back or in the area of your prostate. Summary Prostate cancer is a common type of cancer in men. The prostate gland is located below the bladder and in front of the rectum. This gland adds fluid to semen during ejaculation. Prostate cancer screening may identify cancer at an early stage, when the cancer can be treated more easily and is less likely to have spread to other areas of the body. The prostate-specific antigen (PSA) test is the recommended screening test for prostate cancer, but it has associated risks. Discuss the risks and benefits of prostate cancer screening with your health care provider. If you are age 26 or older, the risks that screening can cause are greater than the benefits that it may provide. This information is not intended to replace advice given to you by your health care provider. Make sure you discuss any questions you have with your health care provider. Document Revised: 12/28/2020 Document Reviewed: 12/28/2020 Elsevier Patient Education  2024 [employer].

## 2024-02-08 NOTE — Progress Notes (Signed)
   02/08/2024 4:17 PM   Debby FORBES Fischer 10-09-1950 [id number]  Reason for visit: Elevated PSA  HPI: 73 year old male who have seen multiple times for elevated and variable PSA, as well as a history of a UTI in 2021 that resolved with antibiotics.  CT at that time was benign, and prostate was enlarged at 115 g.  He has a long history of elevated PSA that has been variable, including 6.23 Nov 2020, 9.1 in September 2022, reassuring 22% free.  Prostate MRI in December 2022 showed no suspicious lesions and a volume of 90 g.  PSA was 6.8 in June 2023, 4.8, 27% free June 2024.  I had recommended discontinuing screening based on his very reassuring lab work, imaging, history, and that routine PSA screening not recommended after age 1.  PCP is continue to check PSA. PSA was 11.3 01/18/2024, decreased on repeat to 8.6 02/02/2024 which is essentially stable over the last 5 years.  Reassurance was again provided regarding normal prostate MRI, variable but overall stable PSA, very reassuring percentage free indicating a less than 10% chance of prostate cancer, and low PSA density of 0.07.  We again reviewed the guidelines that do not recommend routine screening in men over age 70.  He has had some urinary frequency since starting Jardiance .  Denies any dysuria or gross hematuria.  Nocturia 2-3 times per night.  Strategies were discussed, recommend avoiding bladder irritants like diet drinks.  Follow-up with urology as needed, discontinue PSA screening   Redell JAYSON Burnet, MD  [facility] [address], [address] [place], [place] [address] [phone]

## 2024-02-09 DIAGNOSIS — H35033 Hypertensive retinopathy, bilateral: Secondary | ICD-10-CM | POA: Diagnosis not present

## 2024-02-09 DIAGNOSIS — E7800 Pure hypercholesterolemia, unspecified: Secondary | ICD-10-CM | POA: Diagnosis not present

## 2024-02-09 DIAGNOSIS — B880 Other acariasis: Secondary | ICD-10-CM | POA: Diagnosis not present

## 2024-02-09 DIAGNOSIS — H2513 Age-related nuclear cataract, bilateral: Secondary | ICD-10-CM | POA: Diagnosis not present

## 2024-02-09 DIAGNOSIS — I10 Essential (primary) hypertension: Secondary | ICD-10-CM | POA: Diagnosis not present

## 2024-02-09 DIAGNOSIS — E119 Type 2 diabetes mellitus without complications: Secondary | ICD-10-CM | POA: Diagnosis not present

## 2024-02-09 DIAGNOSIS — H01009 Unspecified blepharitis unspecified eye, unspecified eyelid: Secondary | ICD-10-CM | POA: Diagnosis not present

## 2024-02-09 DIAGNOSIS — H524 Presbyopia: Secondary | ICD-10-CM | POA: Diagnosis not present

## 2024-02-09 LAB — HM DIABETES EYE EXAM

## 2024-02-26 ENCOUNTER — Encounter: Payer: Self-pay | Admitting: Internal Medicine

## 2024-03-22 ENCOUNTER — Other Ambulatory Visit: Payer: Self-pay

## 2024-03-22 MED ORDER — GLIPIZIDE ER 5 MG PO TB24: 5.0000 mg | ORAL_TABLET | Freq: Every day | ORAL | 2 refills | Status: AC

## 2024-03-22 NOTE — Telephone Encounter (Signed)
 Rx sent electronically.

## 2024-03-28 ENCOUNTER — Other Ambulatory Visit: Payer: Self-pay | Admitting: Internal Medicine

## 2024-03-28 MED ORDER — EMPAGLIFLOZIN 10 MG PO TABS: 10.0000 mg | ORAL_TABLET | Freq: Every day | ORAL | 3 refills | Status: AC

## 2024-03-28 NOTE — Telephone Encounter (Unsigned)
 Copied from CRM [id number]. Topic: Clinical - Medication Refill >> Mar 28, 2024 11:18 AM [place] E wrote: Medication: empagliflozin  (JARDIANCE ) 10 MG TABS tablet  Has the patient contacted their pharmacy? Yes (Agent: If no, request that the patient contact the pharmacy for the refill. If patient does not wish to contact the pharmacy document the reason why and proceed with request.) (Agent: If yes, when and what did the pharmacy advise?)  This is the patient's preferred pharmacy:   [facility], [place] - [address] [place] [address] Phone: [phone] Fax: [phone]  Is this the correct pharmacy for this prescription? Yes If no, delete pharmacy and type the correct one.   Has the prescription been filled recently? No  Is the patient out of the medication? Yes  Has the patient been seen for an appointment in the last year OR does the patient have an upcoming appointment? Yes  Can we respond through MyChart? Yes  Agent: Please be advised that Rx refills may take up to 3 business days. We ask that you follow-up with your pharmacy.

## 2024-05-08 ENCOUNTER — Ambulatory Visit: Payer: Self-pay | Admitting: General Practice

## 2024-05-08 ENCOUNTER — Ambulatory Visit (INDEPENDENT_AMBULATORY_CARE_PROVIDER_SITE_OTHER): Admitting: General Practice

## 2024-05-08 ENCOUNTER — Encounter: Payer: Self-pay | Admitting: General Practice

## 2024-05-08 VITALS — BP 122/62 | HR 55 | Temp 97.9°F | Ht 68.0 in | Wt 229.0 lb

## 2024-05-08 DIAGNOSIS — E785 Hyperlipidemia, unspecified: Secondary | ICD-10-CM | POA: Diagnosis not present

## 2024-05-08 DIAGNOSIS — E119 Type 2 diabetes mellitus without complications: Secondary | ICD-10-CM

## 2024-05-08 DIAGNOSIS — I2510 Atherosclerotic heart disease of native coronary artery without angina pectoris: Secondary | ICD-10-CM

## 2024-05-08 DIAGNOSIS — Z23 Encounter for immunization: Secondary | ICD-10-CM

## 2024-05-08 DIAGNOSIS — E1159 Type 2 diabetes mellitus with other circulatory complications: Secondary | ICD-10-CM | POA: Diagnosis not present

## 2024-05-08 DIAGNOSIS — K22719 Barrett's esophagus with dysplasia, unspecified: Secondary | ICD-10-CM

## 2024-05-08 DIAGNOSIS — Z7984 Long term (current) use of oral hypoglycemic drugs: Secondary | ICD-10-CM

## 2024-05-08 DIAGNOSIS — Z7689 Persons encountering health services in other specified circumstances: Secondary | ICD-10-CM | POA: Insufficient documentation

## 2024-05-08 LAB — COMPREHENSIVE METABOLIC PANEL WITH GFR
ALT: 33 U/L (ref 0–53)
AST: 31 U/L (ref 0–37)
Albumin: 4.2 g/dL (ref 3.5–5.2)
Alkaline Phosphatase: 43 U/L (ref 39–117)
BUN: 17 mg/dL (ref 6–23)
CO2: 26 meq/L (ref 19–32)
Calcium: 9.2 mg/dL (ref 8.4–10.5)
Chloride: 108 meq/L (ref 96–112)
Creatinine, Ser: 1.26 mg/dL (ref 0.40–1.50)
GFR: 57.51 mL/min — ABNORMAL LOW (ref >60.00)
Glucose, Bld: 202 mg/dL — ABNORMAL HIGH (ref 70–99)
Potassium: 4.2 meq/L (ref 3.5–5.1)
Sodium: 143 meq/L (ref 135–145)
Total Bilirubin: 0.6 mg/dL (ref 0.2–1.2)
Total Protein: 6.5 g/dL (ref 6.0–8.3)

## 2024-05-08 LAB — HEMOGLOBIN A1C: Hgb A1c MFr Bld: 7 % — ABNORMAL HIGH (ref 4.6–6.5)

## 2024-05-08 MED ORDER — PANTOPRAZOLE SODIUM 40 MG PO TBEC: 40.0000 mg | DELAYED_RELEASE_TABLET | Freq: Every day | ORAL | 1 refills | Status: AC

## 2024-05-08 MED ORDER — SIMVASTATIN 80 MG PO TABS: 80.0000 mg | ORAL_TABLET | Freq: Every day | ORAL | 1 refills | Status: AC

## 2024-05-08 MED ORDER — METFORMIN HCL ER 500 MG PO TB24: ORAL_TABLET | ORAL | 1 refills | Status: AC

## 2024-05-08 MED ORDER — BENAZEPRIL HCL 10 MG PO TABS: 10.0000 mg | ORAL_TABLET | Freq: Every day | ORAL | 1 refills | Status: AC

## 2024-05-08 MED ORDER — METOPROLOL SUCCINATE ER 25 MG PO TB24: 25.0000 mg | ORAL_TABLET | Freq: Every day | ORAL | 1 refills | Status: AC

## 2024-05-08 NOTE — Progress Notes (Signed)
 New Patient Office Visit  Subjective    Patient ID: TADASHI BURKEL, male    DOB: July 28, 1950  Age: 73 y.o. MRN: [id number]  CC:  Chief Complaint  Patient presents with   Transitions Of Care    From Dr. Jimmy   Hypertension    Patient states he needs Benazepril  10 mg tabs refilled; no longer on medication list and I do not see where Dr. Jimmy put patient back on medication after it was stopped 06/28/23.     HPI SUSUMU HACKLER is a 73 y.o. male presents to establish care. Previous PCP- Dr. Jimmy.   Discussed the use of AI scribe software for clinical note transcription with the patient, who gave verbal consent to proceed.  History of Present Illness Sutter Ahlgren is a 73 year old male who presents for a transfer of care appointment.  He has a history of type 2 diabetes, currently managed with Jardiance  10 mg once daily, glipizide  5 mg once daily, and metformin  XR 1000 mg daily with breakfast. His last hemoglobin A1c in July was 6.6, slightly higher than the 6.0 recorded in December. He has not been regularly monitoring his blood glucose levels at home.  He underwent a diabetic eye exam in June or July.   He has a history of CAD and is currently managed on simvastatin  80 mg and metoprolol  25 mg daily. He reports no chest pain and states he is not exercising as much as he should. He also takes benazepril  10 mg daily. He was hospitalized for a bleeding ulcer, during which benazepril  was stopped and later restarted after follow-up with his previous provider.  He has a history of a bleeding ulcer and is currently taking Protonix  40 mg daily. He was hospitalized in December for the ulcer, during which aspirin  was discontinued. He has Barrett's esophagus, diagnosed during the hospitalization, and has not had further endoscopies since then.  He has sleep apnea and uses a CPAP machine nightly, averaging over seven hours of sleep. His last sleep study in 2015 showed 65 events per  minute, which has improved to 1.5 events per hour with CPAP use. He recently received a new CPAP machine and uses a nasal pillow mask.  He has a history of an enlarged prostate with a previously elevated PSA. He was evaluated by a urologist who performed an MRI, which showed no cancer, and he was released from further follow-up.   Outpatient Encounter Medications as of 05/08/2024  Medication Sig   empagliflozin  (JARDIANCE ) 10 MG TABS tablet Take 1 tablet (10 mg total) by mouth daily before breakfast.   glipiZIDE  (GLUCOTROL  XL) 5 MG 24 hr tablet Take 1 tablet (5 mg total) by mouth daily with breakfast.   glucose blood test strip Use to check blood sugar once a day. Dx Code E11.59   OneTouch Delica Lancets 30G MISC 1 each by Does not apply route daily. Use to check blood sugar daily. Dx Code E11.59   [DISCONTINUED] benazepril  (LOTENSIN ) 10 MG tablet Take 10 mg by mouth daily.   [DISCONTINUED] metFORMIN  (GLUCOPHAGE -XR) 500 MG 24 hr tablet TAKE 2 TABLETS EVERY DAY WITH BREAKFAST   [DISCONTINUED] metoprolol  succinate (TOPROL -XL) 25 MG 24 hr tablet Take 1 tablet (25 mg total) by mouth daily.   [DISCONTINUED] pantoprazole  (PROTONIX ) 40 MG tablet Take 1 tablet (40 mg total) by mouth daily.   [DISCONTINUED] simvastatin  (ZOCOR ) 80 MG tablet Take 1 tablet (80 mg total) by mouth daily at 6 PM.  benazepril  (LOTENSIN ) 10 MG tablet Take 1 tablet (10 mg total) by mouth daily.   metFORMIN  (GLUCOPHAGE -XR) 500 MG 24 hr tablet Take 2 tablets by mouth daily with breakfast   metoprolol  succinate (TOPROL -XL) 25 MG 24 hr tablet Take 1 tablet (25 mg total) by mouth daily.   pantoprazole  (PROTONIX ) 40 MG tablet Take 1 tablet (40 mg total) by mouth daily.   simvastatin  (ZOCOR ) 80 MG tablet Take 1 tablet (80 mg total) by mouth daily at 6 PM.   No facility-administered encounter medications on file as of 05/08/2024.    Past Medical History:  Diagnosis Date   Allergy    Bright's disease    age 60-6  no issues since     CAD (coronary artery disease) 2005   MI   Diabetes mellitus without complication (HCC)    Hyperlipidemia    Hypertension    Myocardial infarction (HCC) 2005   Pre-diabetes    Sleep apnea    Uses Cpap   Ulcer     Past Surgical History:  Procedure Laterality Date   BIOPSY  06/28/2023   Procedure: BIOPSY;  Surgeon: Legrand Victory LITTIE DOUGLAS, MD;  Location: [facility];  Service: Gastroenterology;;   CERVICAL DISC SURGERY     CORONARY STENT PLACEMENT  2005   ESOPHAGOGASTRODUODENOSCOPY (EGD) WITH PROPOFOL  N/A 06/28/2023   Procedure: ESOPHAGOGASTRODUODENOSCOPY (EGD) WITH PROPOFOL ;  Surgeon: Legrand Victory LITTIE DOUGLAS, MD;  Location: [facility];  Service: Gastroenterology;  Laterality: N/A;   SPINE SURGERY     ULNAR NERVE REPAIR     Left ulnar nerve decompression 02/07   US  ECHOCARDIOGRAPHY      mild inf hypokinesis, EF 55%  09/05    Family History  Problem Relation Age of Onset   Coronary artery disease Mother    Peripheral vascular disease Mother    Cancer Maternal Uncle    Cancer Paternal Aunt    Diabetes Neg Hx    Hypertension Neg Hx    Colon cancer Neg Hx    Colon polyps Neg Hx    Esophageal cancer Neg Hx    Rectal cancer Neg Hx    Stomach cancer Neg Hx    Bladder Cancer Neg Hx    Prostate cancer Neg Hx    Kidney cancer Neg Hx     Social History   Socioeconomic History   Marital status: Married    Spouse name: Not on file   Number of children: 1   Years of education: Not on file   Highest education level: 12th grade  Occupational History   Occupation: [employer]    Comment: Retired 10/22   Occupation: Invoice work/porter    Comment: Magazine features editor  Tobacco Use   Smoking status: Never    Passive exposure: Past   Smokeless tobacco: Former    Types: Chew   Tobacco comments:    Occasionally, couple times a week (on the weekends).  12/24/2021 hfb  Vaping Use   Vaping status: Never Used  Substance and Sexual Activity   Alcohol use: No   Drug use: No    Sexual activity: Yes    Birth control/protection: None  Other Topics Concern   Not on file  Social History Narrative   No living will   Wife would be health care POA_--alternate is son   Would accept resuscitation attempts   Not sure about tube feeds   Social Drivers of Health   Financial Resource Strain: Low Risk  (05/06/2024)   Overall Financial Resource  Strain (CARDIA)    Difficulty of Paying Living Expenses: Not hard at all  Food Insecurity: No Food Insecurity (05/06/2024)   Hunger Vital Sign    Worried About Running Out of Food in the Last Year: Never true    Ran Out of Food in the Last Year: Never true  Transportation Needs: No Transportation Needs (05/06/2024)   PRAPARE - Administrator, Civil Service (Medical): No    Lack of Transportation (Non-Medical): No  Physical Activity: Insufficiently Active (05/06/2024)   Exercise Vital Sign    Days of Exercise per Week: 2 days    Minutes of Exercise per Session: 10 min  Stress: No Stress Concern Present (05/06/2024)   [employer] of Occupational Health - Occupational Stress Questionnaire    Feeling of Stress: Not at all  Social Connections: Socially Integrated (05/06/2024)   Social Connection and Isolation Panel    Frequency of Communication with Friends and Family: More than three times a week    Frequency of Social Gatherings with Friends and Family: Twice a week    Attends Religious Services: More than 4 times per year    Active Member of [employer] or Organizations: Yes    Attends Engineer, structural: More than 4 times per year    Marital Status: Married  Catering manager Violence: Not At Risk (12/12/2023)   Humiliation, Afraid, Rape, and Kick questionnaire    Fear of Current or Ex-Partner: No    Emotionally Abused: No    Physically Abused: No    Sexually Abused: No    Review of Systems  Constitutional:  Negative for chills and fever.  Respiratory:  Negative for shortness of breath.    Cardiovascular:  Negative for chest pain.  Gastrointestinal:  Negative for abdominal pain, constipation, diarrhea, heartburn, nausea and vomiting.  Genitourinary:  Negative for dysuria, frequency and urgency.  Neurological:  Negative for dizziness and headaches.  Endo/Heme/Allergies:  Negative for polydipsia.  Psychiatric/Behavioral:  Negative for depression and suicidal ideas. The patient is not nervous/anxious.         Objective    BP 122/62   Pulse (!) 55   Temp 97.9 F (36.6 C) (Oral)   Ht 5' 8 (1.727 m)   Wt 229 lb (103.9 kg)   SpO2 98%   BMI 34.82 kg/m   Physical Exam Vitals and nursing note reviewed.  Constitutional:      Appearance: Normal appearance.  Cardiovascular:     Rate and Rhythm: Normal rate and regular rhythm.     Pulses: Normal pulses.     Heart sounds: Normal heart sounds.  Pulmonary:     Effort: Pulmonary effort is normal.     Breath sounds: Normal breath sounds.  Neurological:     Mental Status: He is alert and oriented to person, place, and time.  Psychiatric:        Mood and Affect: Mood normal.        Behavior: Behavior normal.        Thought Content: Thought content normal.        Judgment: Judgment normal.         Assessment & Plan:  Type 2 diabetes mellitus with other circulatory complication, without long-term current use of insulin  (HCC) -     metFORMIN  HCl ER; Take 2 tablets by mouth daily with breakfast  Dispense: 180 tablet; Refill: 1 -     Comprehensive metabolic panel with GFR -     Hemoglobin A1c  Establishing  care with new doctor, encounter for Assessment & Plan: EMR reviewed briefly.    Atherosclerosis of native coronary artery of native heart without angina pectoris -     Metoprolol  Succinate ER; Take 1 tablet (25 mg total) by mouth daily.  Dispense: 90 tablet; Refill: 1 -     Benazepril  HCl; Take 1 tablet (10 mg total) by mouth daily.  Dispense: 90 tablet; Refill: 1  Hyperlipidemia, unspecified hyperlipidemia  type -     Simvastatin ; Take 1 tablet (80 mg total) by mouth daily at 6 PM.  Dispense: 90 tablet; Refill: 1  Barrett's esophagus with dysplasia -     Pantoprazole  Sodium; Take 1 tablet (40 mg total) by mouth daily.  Dispense: 90 tablet; Refill: 1  Diabetes mellitus treated with oral medication (HCC) -     metFORMIN  HCl ER; Take 2 tablets by mouth daily with breakfast  Dispense: 180 tablet; Refill: 1    Assessment and Plan Assessment & Plan Type 2 diabetes mellitus Diabetes well-controlled with A1c of 6.6%. On Jardiance , glipizide , and metformin  XR. Not regularly checking blood sugars. - A1c and CMP Pending. - Continue Jardiance  10 mg once daily, Glipizide  5 mg once daily, Metformin  XR 1000 mg once daily.  - Encourage home blood sugar monitoring, especially fasting levels, a few times a week.  Barrett's esophagus Managed with Protonix . - Continue Protonix  40 mg once daily.  Obstructive sleep apnea Well-managed with CPAP therapy. Apnea events reduced significantly. - Continue CPAP therapy.  Hyperlipidemia Managed with simvastatin . Cholesterol levels improved. - Continue simvastatin  80 mg once daily.  Benign prostatic hyperplasia with lower urinary tract symptoms - Will continue to monitor.   Return in about 8 months (around 01/20/2025) for physical and fasting labs.SABRA Carrol Aurora, NP

## 2024-05-08 NOTE — Assessment & Plan Note (Signed)
 EMR reviewed briefly.

## 2024-05-08 NOTE — Addendum Note (Signed)
 Addended by: TENNIE RAISIN B on: 05/08/2024 09:50 AM   Modules accepted: Orders

## 2024-05-08 NOTE — Patient Instructions (Addendum)
 Stop by the lab prior to leaving today. I will notify you of your results once received.   Continue medications as prescribed.   Follow up in 9 months for physical and fasting labs.   It was a pleasure meeting you!

## 2024-05-29 ENCOUNTER — Ambulatory Visit: Payer: Self-pay

## 2024-05-29 NOTE — Telephone Encounter (Signed)
 FYI Only or Action Required?: FYI only for provider: appointment scheduled on 05/30/24.  Patient was last seen in primary care on 05/08/2024 by Vincente Shivers, NP.  Called Nurse Triage reporting Pain.  Symptoms began several days ago.  Interventions attempted: OTC medications: tylenol  and Rest, hydration, or home remedies.  Symptoms are: gradually worsening.  Triage Disposition: See PCP When Office is Open (Within 3 Days)  Patient/caregiver understands and will follow disposition?: Yes Reason for Disposition  [1] MODERATE pain (e.g., interferes with normal activities, limping) AND [2] present > 3 days  Answer Assessment - Initial Assessment Questions Left heel and left knee pain x 4-5 days. Denies trauma or hx of gout or edema.  Next day OV scheduled. Advised to call back if symptoms worsen. If after hours, advised to seek medical attention elsewhere. Pt verbalized understanding.   Recommended continuation of tylenol , elevation and application of heat or ice.   1. ONSET: When did the pain start?      4-5 days  2. LOCATION: Where is the pain located?      Left heel and left knee  3. PAIN: How bad is the pain?    (Scale 1-10; or mild, moderate, severe)     7-8/10  4. WORK OR EXERCISE: Has there been any recent work or exercise that involved this part of the body?      Walks a lot on pavement  5. CAUSE: What do you think is causing the foot pain?     Unknown  6. OTHER SYMPTOMS: Do you have any other symptoms? (e.g., leg pain, rash, fever, numbness)     Denies  Protocols used: Foot Pain-A-AH Copied from CRM [id number]. Topic: Clinical - Red Word Triage >> May 29, 2024  3:59 PM Alfonso ORN wrote: Red Word that prompted transfer to Nurse Triage: left heel and knee(pain), heel feels like knife sticking in it, extremely sore and hurts to walk

## 2024-05-29 NOTE — Telephone Encounter (Signed)
 Appointment with Dugal 05/30/2024.

## 2024-05-30 ENCOUNTER — Encounter: Payer: Self-pay | Admitting: Family

## 2024-05-30 ENCOUNTER — Ambulatory Visit: Payer: Self-pay | Admitting: Family

## 2024-05-30 ENCOUNTER — Ambulatory Visit (INDEPENDENT_AMBULATORY_CARE_PROVIDER_SITE_OTHER)
Admission: RE | Admit: 2024-05-30 | Discharge: 2024-05-30 | Disposition: A | Discharge status: Home / Self Care | Source: Ambulatory Visit | Attending: Family | Admitting: Family

## 2024-05-30 ENCOUNTER — Ambulatory Visit (INDEPENDENT_AMBULATORY_CARE_PROVIDER_SITE_OTHER): Admitting: Family

## 2024-05-30 VITALS — BP 134/64 | HR 59 | Temp 98.7°F | Wt 228.8 lb

## 2024-05-30 DIAGNOSIS — Z8719 Personal history of other diseases of the digestive system: Secondary | ICD-10-CM | POA: Insufficient documentation

## 2024-05-30 DIAGNOSIS — M79672 Pain in left foot: Secondary | ICD-10-CM

## 2024-05-30 DIAGNOSIS — I252 Old myocardial infarction: Secondary | ICD-10-CM | POA: Diagnosis not present

## 2024-05-30 NOTE — Progress Notes (Signed)
 79  Established Patient Office Visit  Subjective:      CC:  Chief Complaint  Patient presents with   Acute Visit    L heel and knee pain x3-4 days ago.     HPI: NELDON SHEPARD is a 73 y.o. male presenting on 05/30/2024 for Acute Visit (L heel and knee pain x3-4 days ago. ) .  Discussed the use of AI scribe software for clinical note transcription with the patient, who gave verbal consent to proceed.  History of Present Illness Jailyn Langhorst is a 73 year old male who presents with left heel pain.  He has been experiencing sharp pain at the bottom of his left heel and soreness on the sides for the last four days. The pain worsens with movement, especially walking, and is severe enough at times to prevent walking. While sitting, the pain is light. No recent injury to the heel is noted, and he wears supportive shoes. No tingling, redness, or significant swelling is present.  Approximately 25 to 30 years ago, he broke his heel, treated with a boot for a 'horseshoe break' after slipping on a step. He is uncertain whether it was the left or right heel that was broken, as his wife believes it was the right heel.  He is currently taking Tylenol  for the pain. Tylenol  has not been very effective in alleviating the pain. He has not used ice or other topical treatments recently.  He has a history of a heart attack in 2005, for which a stent was placed. He was previously on aspirin  but was advised to stop due to a bleeding ulcer diagnosed in January of the previous year. His current medications include benazepril , metoprolol , simvastatin , and metformin .           Social history:  Relevant past medical, surgical, family and social history reviewed and updated as indicated. Interim medical history since our last visit reviewed.  Allergies and medications reviewed and updated.  DATA REVIEWED: CHART IN EPIC     ROS: Negative unless specifically indicated above in HPI.     Current Outpatient Medications:    benazepril  (LOTENSIN ) 10 MG tablet, Take 1 tablet (10 mg total) by mouth daily., Disp: 90 tablet, Rfl: 1   empagliflozin  (JARDIANCE ) 10 MG TABS tablet, Take 1 tablet (10 mg total) by mouth daily before breakfast., Disp: 90 tablet, Rfl: 3   glipiZIDE  (GLUCOTROL  XL) 5 MG 24 hr tablet, Take 1 tablet (5 mg total) by mouth daily with breakfast., Disp: 90 tablet, Rfl: 2   glucose blood test strip, Use to check blood sugar once a day. Dx Code E11.59, Disp: 100 each, Rfl: 4   metFORMIN  (GLUCOPHAGE -XR) 500 MG 24 hr tablet, Take 2 tablets by mouth daily with breakfast, Disp: 180 tablet, Rfl: 1   metoprolol  succinate (TOPROL -XL) 25 MG 24 hr tablet, Take 1 tablet (25 mg total) by mouth daily., Disp: 90 tablet, Rfl: 1   OneTouch Delica Lancets 30G MISC, 1 each by Does not apply route daily. Use to check blood sugar daily. Dx Code E11.59, Disp: 100 each, Rfl: 4   pantoprazole  (PROTONIX ) 40 MG tablet, Take 1 tablet (40 mg total) by mouth daily., Disp: 90 tablet, Rfl: 1   simvastatin  (ZOCOR ) 80 MG tablet, Take 1 tablet (80 mg total) by mouth daily at 6 PM., Disp: 90 tablet, Rfl: 1        Objective:        BP 134/64 (BP Location: Left Arm, Patient  Position: Sitting, Cuff Size: Large)   Pulse (!) 59   Temp 98.7 F (37.1 C) (Temporal)   Wt 228 lb 12.8 oz (103.8 kg)   SpO2 98%   BMI 34.79 kg/m   Physical Exam EXTREMITIES: Swelling and tenderness present on the left heel. No abnormalities in the left ankle. Toenail fungus present.  Wt Readings from Last 3 Encounters:  05/30/24 228 lb 12.8 oz (103.8 kg)  05/08/24 229 lb (103.9 kg)  02/08/24 220 lb (99.8 kg)    Physical Exam Constitutional:      General: He is not in acute distress.    Appearance: Normal appearance. He is normal weight. He is not ill-appearing, toxic-appearing or diaphoretic.  Cardiovascular:     Rate and Rhythm: Normal rate.  Pulmonary:     Effort: Pulmonary effort is normal.   Musculoskeletal:        General: Normal range of motion.  Feet:     Left foot:     Toenail Condition: Fungal disease present.    Comments: Left heel tenderness on palpation medial heel with mild edema good ROM Neurological:     General: No focal deficit present.     Mental Status: He is alert and oriented to person, place, and time. Mental status is at baseline.  Psychiatric:        Mood and Affect: Mood normal.        Behavior: Behavior normal.        Thought Content: Thought content normal.        Judgment: Judgment normal.          Results   Assessment & Plan:   Assessment and Plan Assessment & Plan Left heel pain Chronic left heel pain, likely due to a heel spur, exacerbated by movement and pressure. No recent injury reported. Pain is sharp and located on the bottom and sides of the heel. No tingling, redness, or significant swelling. Differential includes heel spur and arthritic flare. Pain has been present for the last four days and is significant enough to affect mobility. - Ordered x-ray of the left heel to confirm heel spur. - Recommended use of heel pads or heel protectors for cushioning. - Advised use of Voltaren gel for topical pain relief. - Suggested increasing Tylenol  dosage to 1000 mg every four hours, not exceeding 3000 mg per day. - Recommended icing the heel to reduce inflammation. - Advised elevating legs and staying hydrated, especially during prolonged standing. - Encouraged taking breaks and using a chair during long periods of standing.  Old myocardial infarction Myocardial infarction in 2005 with stent placement. No recent cardiac issues reported. Aspirin  was discontinued due to a history of duodenal ulcer with GI bleeding. - Ensure cardiac history is documented in medical records.  History of duodenal ulcer with prior GI bleeding Duodenal ulcer with GI bleeding led to discontinuation of aspirin  and ibuprofen. No current symptoms of GI bleeding  reported. - Continue to avoid NSAIDs like ibuprofen due to history of GI bleeding.        Return for f/u PCP if no improvement in symptoms.     Ginger Patrick, MSN, APRN, FNP-C [facility]

## 2024-05-30 NOTE — Patient Instructions (Addendum)
    YOUR PLAN: -LEFT HEEL PAIN: Your heel pain is likely due to a heel spur, which is a bony growth on the bottom of the heel that can cause sharp pain, especially with movement. We have ordered an x-ray to confirm this. In the meantime, you should use heel pads or heel protectors for cushioning, apply Voltaren gel for pain relief, and increase your Tylenol  dosage to 1000 mg every four hours, not exceeding 3000 mg per day. Additionally, icing the heel, elevating your legs, staying hydrated, and taking breaks during long periods of standing will help manage the pain.              Contains text generated by Abridge.                                 Contains text generated by Abridge.

## 2024-05-30 NOTE — Telephone Encounter (Signed)
 Noted; appreciate Tabitha's evaluation.   -Carrol Aurora, DNP, AGNP-C 05/30/2024 9:22 AM

## 2024-05-31 NOTE — Telephone Encounter (Signed)
 Very sweet man.  Heel spur, conservative treatment. Advised to f/u with you if no improvement to consider podiatry

## 2024-05-31 NOTE — Telephone Encounter (Signed)
Thank you for seeing him! 

## 2024-06-21 ENCOUNTER — Ambulatory Visit: Payer: Self-pay

## 2024-06-21 NOTE — Telephone Encounter (Signed)
 FYI Only or Action Required?: Action required by provider: clinical question for provider, update on patient condition, and declined appointment.  Patient was last seen in primary care on 05/30/2024 by Corwin Antu, FNP.  Called Nurse Triage reporting Foot Pain.  Symptoms began 1-2 months ago.  Interventions attempted: OTC medications: Voltaren gel and Tylenol  Arthritis, Ice/heat application, and Other: heel cuff.  Symptoms are: unchanged.  Triage Disposition: See PCP When Office is Open (Within 3 Days)  Patient/caregiver understands and will follow disposition?: No, wishes to speak with PCP                               1. ONSET: When did the pain start?      1-2 months  2. LOCATION: Where is the pain located?      Left heel 3. PAIN: How bad is the pain?    (Scale 1-10; or mild, moderate, severe)     Pain exacerbates when walking, states pain can get up to a 10, states improves to a 3-4 when not bearing weight 5. CAUSE: What do you think is causing the foot pain?     Heel spur 6. OTHER SYMPTOMS: Do you have any other symptoms? (e.g., leg pain, rash, fever, numbness)     Mild swelling Denies discoloration, denies fever, denies numbness, denies redness  Patient was evaluated for symptoms at OV on 05/30/24. Patient stated he has been following the treatment plan discussed and symptoms remain unchanged. This RN advised in-person evaluation. Patient declined and stated he does not think anything would be done differently at another OV. Patient would like provider's advice. Please advise. Patient would like a call back.   Copied from CRM [id number]. Topic: Clinical - Red Word Triage >> Jun 21, 2024 11:49 AM Suzen RAMAN wrote: Red Word that prompted transfer to Nurse Triage: symptoms not improving... severe pain in heel when attempting to work was seen 05/30/24 and provided with recommendation but they're are not helping  Reason for Disposition  [1]  MODERATE pain (e.g., interferes with normal activities, limping) AND [2] present > 3 days  Protocols used: Foot Pain-A-AH

## 2024-06-21 NOTE — Telephone Encounter (Signed)
 FYI Only or Action Required?: FYI only for provider: appointment scheduled on 06/24/2024.  Patient was last seen in primary care on 05/30/2024 by Corwin Antu, FNP.  Called Nurse Triage reporting Appointment.  Interventions attempted: OTC medications: topical creams, ibuprofen, heel cups, Rest, hydration, or home remedies, and Ice/heat application.  Symptoms are: unchanged.  Triage Disposition: See PCP Within 2 Weeks  Patient/caregiver understands and will follow disposition?: Yes  Copied from CRM [id number]. Topic: Clinical - Red Word Triage >> Jun 21, 2024 11:49 AM Suzen RAMAN wrote: Red Word that prompted transfer to Nurse Triage: symptoms not improving... severe pain in heel when attempting to work was seen 05/30/24 and provided with recommendation but they're are not helping >> Jun 21, 2024  3:42 PM Dedra B wrote: Pt calling back to schedule appt. Warm transfer to NT Reason for Disposition  Requesting regular office appointment  Answer Assessment - Initial Assessment Questions 1. REASON FOR CALL: What is the main reason for your call? or How can I best help you?     Patient called to schedule appointment based on triage earlier today 2. SYMPTOMS : Do you have any symptoms?      Heel pain 3. OTHER QUESTIONS: Do you have any other questions?     denies  Protocols used: Information Only Call - No Triage-A-AH

## 2024-06-24 ENCOUNTER — Ambulatory Visit: Admitting: Family

## 2024-06-24 VITALS — BP 160/78 | HR 66 | Temp 98.6°F | Ht 68.0 in | Wt 228.0 lb

## 2024-06-24 DIAGNOSIS — M79672 Pain in left foot: Secondary | ICD-10-CM

## 2024-06-24 NOTE — Telephone Encounter (Signed)
 I saw this pt over three weeks ago, so at this time I will defer to you.   Given how he was at that visit if there was not improvement I'd likely say it may be time for podiatry but I will leave this at your discretion whether he needs to be seen in office again with you or referred.

## 2024-06-24 NOTE — Telephone Encounter (Signed)
 Disregard  Looks like pt is seeing me today

## 2024-06-24 NOTE — Progress Notes (Unsigned)
 Established Patient Office Visit  Subjective:      CC:  Chief Complaint  Patient presents with   Foot Pain    Left heel pain x 1 month     HPI: Anthony Hunt is a 73 y.o. male presenting on 06/24/2024 for Foot Pain (Left heel pain x 1 month ) .  Discussed the use of AI scribe software for clinical note transcription with the patient, who gave verbal consent to proceed.  History of Present Illness Anthony Hunt is a 73 year old male who presents with worsening heel pain.  He experiences sharp pain at the bottom of his left heel, with soreness on the sides. The pain intensifies with movement and walking, sometimes becoming severe enough to prevent walking. While sitting, the pain is mild, described as dull at rest but can become sharp. The pain is localized to the heel and does not radiate.  He has a history of a broken heel. 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 He has tried various treatments including heel cushions, Tylenol , and Voltaren gel, but none have provided relief.  No weakness in the foot, numbness, or radiating pain. He can move his foot up and down without significant difficulty, although it hurts slightly. The pain does not improve in the morning and remains consistent with walking.  He lives about two miles from the clinic and plans to return to work after the visit.     Lab Results  Component Value Date   HGBA1C 7.0 (H) 05/08/2024       Social history:  Relevant past medical, surgical, family and social history reviewed and updated as indicated. Interim medical history since our last visit reviewed.  Allergies and medications reviewed and updated.  DATA REVIEWED: CHART IN EPIC     ROS: Negative unless specifically indicated above in HPI.    Current Outpatient Medications:    benazepril  (LOTENSIN ) 10 MG tablet, Take  1 tablet (10 mg total) by mouth daily., Disp: 90 tablet, Rfl: 1   empagliflozin  (JARDIANCE ) 10 MG TABS tablet, Take 1 tablet (10 mg total) by mouth daily before breakfast., Disp: 90 tablet, Rfl: 3   glipiZIDE  (GLUCOTROL  XL) 5 MG 24 hr tablet, Take 1 tablet (5 mg total) by mouth daily with breakfast., Disp: 90 tablet, Rfl: 2   glucose blood test strip, Use to check blood sugar once a day. Dx Code E11.59, Disp: 100 each, Rfl: 4   metFORMIN  (GLUCOPHAGE -XR) 500 MG 24 hr tablet, Take 2 tablets by mouth daily with breakfast, Disp: 180 tablet, Rfl: 1   metoprolol  succinate (TOPROL -XL) 25 MG 24 hr tablet, Take 1 tablet (25 mg total) by mouth daily., Disp: 90 tablet, Rfl: 1   OneTouch Delica Lancets 30G MISC, 1 each by Does not apply route daily. Use to check blood sugar daily. Dx Code E11.59, Disp: 100 each, Rfl: 4   pantoprazole  (PROTONIX ) 40 MG tablet, Take 1 tablet (40 mg total) by mouth daily., Disp: 90 tablet, Rfl: 1   simvastatin  (ZOCOR ) 80 MG tablet, Take 1 tablet (80 mg total) by mouth daily at 6 PM., Disp: 90 tablet, Rfl: 1        Objective:        BP (!) 160/78   Pulse 66   Temp 98.6 F (37 C) (Temporal)   Ht 5' 8 (1.727 m)   Wt 228  lb (103.4 kg)   SpO2 98%   BMI 34.67 kg/m   Physical Exam VITALS: BP- 165/80 MUSCULOSKELETAL: Tenderness at the bottom of the right heel. Dorsiflexion normal.  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 Pain is localized, with no numbness or radiating pain. Conservative measures including heel cushions, acetaminophen , and diclofenac gel have been ineffective. - Referred to podiatrist for further evaluation and management  Recording duration: 8 minutes      Return for f/u PCP if no improvement in symptoms.     Ginger Patrick, MSN, APRN, FNP-C [facility]

## 2024-07-01 ENCOUNTER — Encounter: Payer: Self-pay | Admitting: Podiatry

## 2024-07-01 ENCOUNTER — Ambulatory Visit: Admitting: Podiatry

## 2024-07-01 DIAGNOSIS — M722 Plantar fascial fibromatosis: Secondary | ICD-10-CM | POA: Diagnosis not present

## 2024-07-01 MED ORDER — MELOXICAM 15 MG PO TABS: 15.0000 mg | ORAL_TABLET | Freq: Every day | ORAL | 3 refills | Status: AC

## 2024-07-01 MED ORDER — TRIAMCINOLONE ACETONIDE 40 MG/ML IJ SUSP
20.0000 mg | Freq: Once | INTRAMUSCULAR | Status: AC
  Administered 2024-07-01: 16:00:00 20 mg

## 2024-07-01 NOTE — Progress Notes (Signed)
 Subjective:  Patient ID: Anthony Hunt, male    DOB: 1951-01-11,  MRN: [id number] HPI Chief Complaint  Patient presents with   Foot Pain      Np Intractable left heel pain    73 y.o. male presents with the above complaint.   ROS: Denies fever chills nausea vomit muscle aches pains back pain calf pain chest pain shortness of breath.  Past Medical History:  Diagnosis Date   Allergy    Bright's disease    age 32-6  no issues since    CAD (coronary artery disease) 2005   MI   Diabetes mellitus without complication (HCC)    Hyperlipidemia    Hypertension    Myocardial infarction (HCC) 2005   Pre-diabetes    Sleep apnea    Uses Cpap   Ulcer    Past Surgical History:  Procedure Laterality Date   BIOPSY  06/28/2023   Procedure: BIOPSY;  Surgeon: Legrand Victory LITTIE DOUGLAS, MD;  Location: [facility] ENDOS[facility];  Service: Gastroenterology;;   CERVICAL DISC SURGERY     CORONARY STENT PLACEMENT  2005   ESOPHAGOGASTRODUODENOSCOPY (EGD) WITH PROPOFOL  N/A 06/28/2023   Procedure: ESOPHAGOGASTRODUODENOSCOPY (EGD) WITH PROPOFOL ;  Surgeon: Legrand Victory LITTIE DOUGLAS, MD;  Location: [facility];  Service: Gastroenterology;  Laterality: N/A;   SPINE SURGERY     ULNAR NERVE REPAIR     Left ulnar nerve decompression 02/07   US  ECHOCARDIOGRAPHY      mild inf hypokinesis, EF 55%  09/05   Current Medications[1]  Allergies[2] Review of Systems Objective:  There were no vitals filed for this visit.  General: Well developed, nourished, in no acute distress, alert and oriented x3   Dermatological: Skin is warm, dry and supple bilateral. Nails x 10 are well maintained; remaining integument appears unremarkable at this time. There are no open sores, no preulcerative lesions, no rash or signs of infection present.  Vascular: Dorsalis Pedis artery and Posterior Tibial artery pedal pulses are 2/4 bilateral with immedate capillary fill time. Pedal hair growth present. No varicosities and no lower extremity edema  present bilateral.   Neruologic: Grossly intact via light touch bilateral. Vibratory intact via tuning fork bilateral. Protective threshold with Semmes Wienstein monofilament intact to all pedal sites bilateral. Patellar and Achilles deep tendon reflexes 2+ bilateral. No Babinski or clonus noted bilateral.   Musculoskeletal: No gross boney pedal deformities bilateral. No pain, crepitus, or limitation noted with foot and ankle range of motion bilateral. Muscular strength 5/5 in all groups tested bilateral.  Pain on palpation medial calcaneal tubercle of his left heel.  No pain on medial-lateral compression of the calcaneus.  Gait: Unassisted, Nonantalgic.    Radiographs:  Radiographs previously taken recently nonweightbearing demonstrate soft tissue increase in density plantar fascial canny insertion site consistent with plantar fasciitis.  Does also demonstrate an old fracture scar in the body of the calcaneus that has gone on to heal uneventfully.    Assessment & Plan:   Assessment: Plantar fasciitis left with diabetes mellitus.    Plan: Discussed etiology pathology conservative versus surgical therapies.  Injected his left heel today started him on meloxicam  15 mg 1 p.o. daily #30.  The injection was made up of 20 mg Kenalog  5 mg Marcaine to the point of maximal tenderness.  Tolerated procedure well without complications.     Maryfer Tauzin T. Deunte Bledsoe, DPM    [1]  Current Outpatient Medications:    meloxicam  (MOBIC ) 15 MG tablet, Take 1 tablet (15 mg total) by mouth  daily., Disp: 30 tablet, Rfl: 3   benazepril  (LOTENSIN ) 10 MG tablet, Take 1 tablet (10 mg total) by mouth daily., Disp: 90 tablet, Rfl: 1   empagliflozin  (JARDIANCE ) 10 MG TABS tablet, Take 1 tablet (10 mg total) by mouth daily before breakfast., Disp: 90 tablet, Rfl: 3   glipiZIDE  (GLUCOTROL  XL) 5 MG 24 hr tablet, Take 1 tablet (5 mg total) by mouth daily with breakfast., Disp: 90 tablet, Rfl: 2   glucose blood test strip, Use to  check blood sugar once a day. Dx Code E11.59, Disp: 100 each, Rfl: 4   metFORMIN  (GLUCOPHAGE -XR) 500 MG 24 hr tablet, Take 2 tablets by mouth daily with breakfast, Disp: 180 tablet, Rfl: 1   metoprolol  succinate (TOPROL -XL) 25 MG 24 hr tablet, Take 1 tablet (25 mg total) by mouth daily., Disp: 90 tablet, Rfl: 1   OneTouch Delica Lancets 30G MISC, 1 each by Does not apply route daily. Use to check blood sugar daily. Dx Code E11.59, Disp: 100 each, Rfl: 4   pantoprazole  (PROTONIX ) 40 MG tablet, Take 1 tablet (40 mg total) by mouth daily., Disp: 90 tablet, Rfl: 1   simvastatin  (ZOCOR ) 80 MG tablet, Take 1 tablet (80 mg total) by mouth daily at 6 PM., Disp: 90 tablet, Rfl: 1 [2] No Known Allergies

## 2024-07-29 ENCOUNTER — Ambulatory Visit: Admitting: Podiatry

## 2024-07-29 DIAGNOSIS — M722 Plantar fascial fibromatosis: Secondary | ICD-10-CM

## 2024-07-29 MED ORDER — METHYLPREDNISOLONE 4 MG PO TBPK: ORAL_TABLET | ORAL | 0 refills | Status: AC

## 2024-07-29 MED ORDER — TRIAMCINOLONE ACETONIDE 40 MG/ML IJ SUSP: 20.0000 mg | Freq: Once | INTRAMUSCULAR | Status: AC

## 2024-07-29 NOTE — Progress Notes (Signed)
 Anthony Hunt presents today after a month to 6 weeks of plantar fasciitis of his left foot.  States that it was good for a couple days then went right back down to 0.  We were unable to give him prednisone  at that time because of his elevated blood sugar however he has been taking his meloxicam  regularly and just had a refill.  Objective: Vital signs are stable he is alert and oriented x 3.  Pulses are palpable.  Severe pain on palpation medial calcaneal tubercle of the left heel.  Assessment: Plantar fasciitis intractable in nature left foot.  Plan: We are going to start him on methylprednisolone  today just 4 mg 6-day dose and he will then restart his meloxicam  at that time.  And I will follow-up with him in 4 to 6 weeks.  If this does not reduce his pain and inflammation considerably we will consider MRI at that time.

## 2024-08-26 ENCOUNTER — Ambulatory Visit: Admitting: Podiatry

## 2024-12-02 ENCOUNTER — Ambulatory Visit

## 2024-12-12 ENCOUNTER — Ambulatory Visit

## 2025-01-20 ENCOUNTER — Encounter: Admitting: General Practice
